# Patient Record
Sex: Female | Born: 1975
Health system: Southern US, Community
[De-identification: ages and names within clinical notes are randomized; demographics above are authoritative.]

## PROBLEM LIST (undated history)

## (undated) ENCOUNTER — Ambulatory Visit (HOSPITAL_COMMUNITY): Payer: Commercial Managed Care - PPO

## (undated) ENCOUNTER — Ambulatory Visit (HOSPITAL_COMMUNITY): Admission: EM | Payer: Commercial Managed Care - PPO

## (undated) ENCOUNTER — Ambulatory Visit

## (undated) ENCOUNTER — Ambulatory Visit: Payer: Commercial Managed Care - PPO

## (undated) ENCOUNTER — Ambulatory Visit: Admission: EM | Payer: No Typology Code available for payment source | Source: Home / Self Care

## (undated) ENCOUNTER — Ambulatory Visit: Payer: TRICARE (CHAMPUS)

## (undated) ENCOUNTER — Ambulatory Visit: Attending: Ambulatory Care | Primary: Ambulatory Care

## (undated) ENCOUNTER — Encounter

## (undated) ENCOUNTER — Encounter: Payer: TRICARE (CHAMPUS) | Attending: Family | Primary: Family

## (undated) ENCOUNTER — Non-Acute Institutional Stay: Payer: TRICARE (CHAMPUS)

## (undated) ENCOUNTER — Telehealth: Attending: Family | Primary: Family

## (undated) DIAGNOSIS — M459 Ankylosing spondylitis of unspecified sites in spine: Secondary | ICD-10-CM

## (undated) DIAGNOSIS — K219 Gastro-esophageal reflux disease without esophagitis: Secondary | ICD-10-CM

## (undated) DIAGNOSIS — U071 COVID-19: Secondary | ICD-10-CM

## (undated) DIAGNOSIS — M069 Rheumatoid arthritis, unspecified: Secondary | ICD-10-CM

## (undated) DIAGNOSIS — G43909 Migraine, unspecified, not intractable, without status migrainosus: Secondary | ICD-10-CM

## (undated) DIAGNOSIS — F419 Anxiety disorder, unspecified: Secondary | ICD-10-CM

## (undated) DIAGNOSIS — R32 Unspecified urinary incontinence: Secondary | ICD-10-CM

## (undated) DIAGNOSIS — E079 Disorder of thyroid, unspecified: Secondary | ICD-10-CM

## (undated) DIAGNOSIS — D649 Anemia, unspecified: Secondary | ICD-10-CM

## (undated) DIAGNOSIS — I10 Essential (primary) hypertension: Secondary | ICD-10-CM

## (undated) DIAGNOSIS — R079 Chest pain, unspecified: Secondary | ICD-10-CM

## (undated) DIAGNOSIS — G35 Multiple sclerosis: Secondary | ICD-10-CM

## (undated) DIAGNOSIS — G35D Multiple sclerosis, unspecified: Secondary | ICD-10-CM

## (undated) HISTORY — PX: APPENDECTOMY: SHX54

## (undated) HISTORY — DX: Anemia, unspecified: D64.9

## (undated) HISTORY — DX: Anxiety disorder, unspecified: F41.9

## (undated) HISTORY — DX: Unspecified urinary incontinence: R32

## (undated) HISTORY — DX: Ankylosing spondylitis of unspecified sites in spine: M45.9

## (undated) HISTORY — DX: Migraine, unspecified, not intractable, without status migrainosus: G43.909

## (undated) HISTORY — DX: Essential (primary) hypertension: I10

## (undated) HISTORY — PX: ABDOMINAL HYSTERECTOMY: SHX81

## (undated) HISTORY — DX: Gastro-esophageal reflux disease without esophagitis: K21.9

## (undated) HISTORY — PX: CHOLECYSTECTOMY: SHX55

## (undated) HISTORY — PX: TUBAL LIGATION: SHX77

---

## 2002-08-03 HISTORY — PX: HERNIA REPAIR: SHX51

## 2004-08-03 HISTORY — PX: KNEE SURGERY: SHX244

## 2017-09-03 ENCOUNTER — Ambulatory Visit (HOSPITAL_COMMUNITY)
Admission: EM | Admit: 2017-09-03 | Discharge: 2017-09-03 | Disposition: A | Discharge status: Home / Self Care | Attending: Internal Medicine | Admitting: Internal Medicine

## 2017-09-03 ENCOUNTER — Encounter (HOSPITAL_COMMUNITY): Payer: Self-pay | Admitting: Emergency Medicine

## 2017-09-03 ENCOUNTER — Other Ambulatory Visit: Payer: Self-pay

## 2017-09-03 ENCOUNTER — Emergency Department (HOSPITAL_COMMUNITY)

## 2017-09-03 ENCOUNTER — Encounter (HOSPITAL_COMMUNITY): Payer: Self-pay | Admitting: Family Medicine

## 2017-09-03 ENCOUNTER — Observation Stay (HOSPITAL_COMMUNITY)
Admission: EM | Admit: 2017-09-03 | Discharge: 2017-09-04 | Disposition: A | Discharge status: Home / Self Care | Attending: Family Medicine | Admitting: Family Medicine

## 2017-09-03 DIAGNOSIS — Z79899 Other long term (current) drug therapy: Secondary | ICD-10-CM | POA: Insufficient documentation

## 2017-09-03 DIAGNOSIS — R072 Precordial pain: Secondary | ICD-10-CM | POA: Diagnosis not present

## 2017-09-03 DIAGNOSIS — E039 Hypothyroidism, unspecified: Secondary | ICD-10-CM | POA: Diagnosis not present

## 2017-09-03 DIAGNOSIS — M059 Rheumatoid arthritis with rheumatoid factor, unspecified: Secondary | ICD-10-CM | POA: Diagnosis not present

## 2017-09-03 DIAGNOSIS — R079 Chest pain, unspecified: Secondary | ICD-10-CM | POA: Diagnosis not present

## 2017-09-03 DIAGNOSIS — E079 Disorder of thyroid, unspecified: Secondary | ICD-10-CM

## 2017-09-03 DIAGNOSIS — M069 Rheumatoid arthritis, unspecified: Secondary | ICD-10-CM | POA: Diagnosis not present

## 2017-09-03 HISTORY — DX: Chest pain, unspecified: R07.9

## 2017-09-03 HISTORY — DX: Rheumatoid arthritis, unspecified: M06.9

## 2017-09-03 HISTORY — DX: Disorder of thyroid, unspecified: E07.9

## 2017-09-03 LAB — TROPONIN I
Troponin I: <0.03 ng/mL (ref <0.03)
Troponin I: <0.03 ng/mL (ref <0.03)
Troponin I: <0.03 ng/mL (ref <0.03)

## 2017-09-03 LAB — LIPID PANEL
Cholesterol: 228 mg/dL — ABNORMAL HIGH (ref 0–200)
HDL: 47 mg/dL (ref >40)
LDL Cholesterol: 165 mg/dL — ABNORMAL HIGH (ref 0–99)
Total CHOL/HDL Ratio: 4.9 RATIO
Triglycerides: 82 mg/dL (ref <150)
VLDL: 16 mg/dL (ref 0–40)

## 2017-09-03 LAB — I-STAT TROPONIN, ED
Troponin i, poc: 0 ng/mL (ref 0.00–0.08)
Troponin i, poc: 0 ng/mL (ref 0.00–0.08)

## 2017-09-03 LAB — BASIC METABOLIC PANEL
Anion gap: 10 (ref 5–15)
BUN: 7 mg/dL (ref 6–20)
CO2: 23 mmol/L (ref 22–32)
Calcium: 9.1 mg/dL (ref 8.9–10.3)
Chloride: 103 mmol/L (ref 101–111)
Creatinine, Ser: 0.74 mg/dL (ref 0.44–1.00)
GFR calc Af Amer: >60 mL/min (ref >60)
GFR calc non Af Amer: >60 mL/min (ref >60)
Glucose, Bld: 83 mg/dL (ref 65–99)
Potassium: 3.8 mmol/L (ref 3.5–5.1)
Sodium: 136 mmol/L (ref 135–145)

## 2017-09-03 LAB — CBC
HCT: 36.5 % (ref 36.0–46.0)
Hemoglobin: 11.5 g/dL — ABNORMAL LOW (ref 12.0–15.0)
MCH: 25.3 pg — ABNORMAL LOW (ref 26.0–34.0)
MCHC: 31.5 g/dL (ref 30.0–36.0)
MCV: 80.4 fL (ref 78.0–100.0)
Platelets: 278 10*3/uL (ref 150–400)
RBC: 4.54 MIL/uL (ref 3.87–5.11)
RDW: 15 % (ref 11.5–15.5)
WBC: 8 10*3/uL (ref 4.0–10.5)

## 2017-09-03 LAB — I-STAT BETA HCG BLOOD, ED (MC, WL, AP ONLY): I-stat hCG, quantitative: <5 m[IU]/mL (ref <5)

## 2017-09-03 LAB — D-DIMER, QUANTITATIVE: D-Dimer, Quant: 0.57 ug/mL-FEU — ABNORMAL HIGH (ref 0.00–0.50)

## 2017-09-03 LAB — TSH: TSH: 0.943 u[IU]/mL (ref 0.350–4.500)

## 2017-09-03 MED ORDER — ACETAMINOPHEN 325 MG PO TABS: 650.0000 mg | ORAL_TABLET | ORAL | Status: DC | PRN

## 2017-09-03 MED ORDER — DARIFENACIN HYDROBROMIDE ER 7.5 MG PO TB24
7.5000 mg | ORAL_TABLET | Freq: Every day | ORAL | Status: DC
  Administered 2017-09-04: 7.5 mg via ORAL
  Filled 2017-09-03: qty 1

## 2017-09-03 MED ORDER — ESCITALOPRAM OXALATE 10 MG PO TABS
10.0000 mg | ORAL_TABLET | Freq: Every day | ORAL | Status: DC
Start: 2017-09-04 — End: 2017-09-04
  Administered 2017-09-04: 10 mg via ORAL
  Filled 2017-09-03: qty 1

## 2017-09-03 MED ORDER — ONDANSETRON HCL 4 MG/2ML IJ SOLN
4.0000 mg | Freq: Four times a day (QID) | INTRAMUSCULAR | Status: DC | PRN
  Administered 2017-09-04: 4 mg via INTRAVENOUS
  Filled 2017-09-03: qty 2

## 2017-09-03 MED ORDER — IBUPROFEN 200 MG PO TABS
400.0000 mg | ORAL_TABLET | ORAL | Status: DC | PRN
  Administered 2017-09-03 – 2017-09-04 (×2): 400 mg via ORAL
  Filled 2017-09-03 (×2): qty 2

## 2017-09-03 MED ORDER — CELECOXIB 200 MG PO CAPS
200.0000 mg | ORAL_CAPSULE | Freq: Two times a day (BID) | ORAL | Status: DC
  Administered 2017-09-03 – 2017-09-04 (×2): 200 mg via ORAL
  Filled 2017-09-03 (×3): qty 1

## 2017-09-03 MED ORDER — SODIUM CHLORIDE 0.9 % IV SOLN
INTRAVENOUS | Status: DC
  Administered 2017-09-03: 18:00:00 via INTRAVENOUS

## 2017-09-03 MED ORDER — LINACLOTIDE 145 MCG PO CAPS
145.0000 ug | ORAL_CAPSULE | Freq: Every day | ORAL | Status: DC
  Administered 2017-09-04: 145 ug via ORAL
  Filled 2017-09-03: qty 1

## 2017-09-03 MED ORDER — GI COCKTAIL ~~LOC~~
30.0000 mL | Freq: Four times a day (QID) | ORAL | Status: DC | PRN
  Administered 2017-09-04: 30 mL via ORAL
  Filled 2017-09-03: qty 30

## 2017-09-03 MED ORDER — FENTANYL CITRATE (PF) 100 MCG/2ML IJ SOLN
50.0000 ug | INTRAMUSCULAR | Status: DC | PRN
  Administered 2017-09-03 – 2017-09-04 (×3): 50 ug via INTRAVENOUS
  Filled 2017-09-03 (×3): qty 2

## 2017-09-03 MED ORDER — ENOXAPARIN SODIUM 40 MG/0.4ML ~~LOC~~ SOLN
40.0000 mg | SUBCUTANEOUS | Status: DC
  Administered 2017-09-03: 40 mg via SUBCUTANEOUS
  Filled 2017-09-03: qty 0.4

## 2017-09-03 MED ORDER — ETANERCEPT 50 MG/ML ~~LOC~~ SOSY: 50.0000 mg | PREFILLED_SYRINGE | SUBCUTANEOUS | Status: DC

## 2017-09-03 MED ORDER — LEVOTHYROXINE SODIUM 25 MCG PO TABS
25.0000 ug | ORAL_TABLET | Freq: Every day | ORAL | Status: DC
  Administered 2017-09-04: 25 ug via ORAL
  Filled 2017-09-03: qty 1

## 2017-09-03 MED ORDER — FOLIC ACID 1 MG PO TABS
1.0000 mg | ORAL_TABLET | Freq: Every day | ORAL | Status: DC
  Administered 2017-09-04: 1 mg via ORAL
  Filled 2017-09-03: qty 1

## 2017-09-03 MED ORDER — ZOLPIDEM TARTRATE 5 MG PO TABS: 5.0000 mg | ORAL_TABLET | Freq: Every evening | ORAL | Status: DC | PRN

## 2017-09-03 MED ORDER — LEFLUNOMIDE 20 MG PO TABS
20.0000 mg | ORAL_TABLET | Freq: Every day | ORAL | Status: DC
  Administered 2017-09-04: 20 mg via ORAL
  Filled 2017-09-03: qty 1

## 2017-09-03 MED ORDER — MAGNESIUM GLUCONATE 500 MG PO TABS
250.0000 mg | ORAL_TABLET | Freq: Every day | ORAL | Status: DC
  Filled 2017-09-03: qty 1

## 2017-09-03 MED ORDER — ASPIRIN EC 325 MG PO TBEC
325.0000 mg | DELAYED_RELEASE_TABLET | Freq: Every day | ORAL | Status: DC
  Administered 2017-09-04: 325 mg via ORAL
  Filled 2017-09-03: qty 1

## 2017-09-03 MED ORDER — VITAMIN D 1000 UNITS PO TABS
1000.0000 [IU] | ORAL_TABLET | Freq: Every day | ORAL | Status: DC
  Administered 2017-09-04: 1000 [IU] via ORAL
  Filled 2017-09-03: qty 1

## 2017-09-03 MED ORDER — MORPHINE SULFATE (PF) 4 MG/ML IV SOLN
2.0000 mg | INTRAVENOUS | Status: DC | PRN
  Administered 2017-09-03: 2 mg via INTRAVENOUS
  Filled 2017-09-03: qty 1

## 2017-09-03 MED ORDER — ASPIRIN 81 MG PO CHEW
324.0000 mg | CHEWABLE_TABLET | Freq: Once | ORAL | Status: AC
  Administered 2017-09-03: 324 mg via ORAL
  Filled 2017-09-03: qty 4

## 2017-09-03 MED ORDER — IOPAMIDOL (ISOVUE-370) INJECTION 76%
INTRAVENOUS | Status: AC
  Administered 2017-09-03: 100 mL
  Filled 2017-09-03: qty 100

## 2017-09-03 MED ORDER — NITROGLYCERIN 0.4 MG SL SUBL
0.4000 mg | SUBLINGUAL_TABLET | SUBLINGUAL | Status: DC | PRN
  Administered 2017-09-03 – 2017-09-04 (×2): 0.4 mg via SUBLINGUAL
  Filled 2017-09-03 (×2): qty 1

## 2017-09-03 MED ORDER — MAGNESIUM 250 MG PO TABS: 250.0000 mg | ORAL_TABLET | Freq: Every day | ORAL | Status: DC

## 2017-09-03 NOTE — Consult Note (Signed)
Cardiology Consult    Patient ID: Maria Wiley MRN: [id number], DOB/AGE: 01-Feb-1976   Admit date: 09/03/2017 Date of Consult: 09/03/2017  Primary Physician: Patient, No Pcp Per Primary Cardiologist: New Requesting Provider: Luberta Robertson Reason for Consultation: Chest pain  Maria Wiley is a 43 y.o. female who is being seen today for the evaluation of Chest pain at the request of Dr. Luberta Robertson.   Patient Profile    42 yo female with PMH of RA and hypothyroidism who presented with chest pain.  Past Medical History   Past Medical History:  Diagnosis Date  . Chest pain   . RA (rheumatoid arthritis) (HCC)   . Thyroid disease     Past Surgical History:  Procedure Laterality Date  . ABDOMINAL HYSTERECTOMY    . APPENDECTOMY    . CHOLECYSTECTOMY       Allergies  No Known Allergies  History of Present Illness    Maria Wiley is a 42 yo female with PMH of RA and hypothyroidism. Reports never having been seen by cardiology in the past. She is retired from [employer] and has 4 children. Fairly active at home and does not normally have anginal symptoms. Over the past several days she had felt short of breath, like when walking up the stairs. Last night had an episode of chest pain that was sharp in nature with numbness in her left arm. Took some ASA and cough medication and fell asleep. Woke up this morning with sensation of "something stuck in her chest". Chest pain is worse with deep breath and movement.   In the ED her labs showed stable electrolytes, neg trop x1. CXR negative. EKG SR with non specific T wave abnormality. Ddimer was 0.57, but CTA negative for PE. She attempted to ambulate in the ED and had some recurrent chest pain. Admitted to internal medicine and cardiology asked to consult.   Inpatient Medications    . [START ON 09/04/2017] aspirin EC  325 mg Oral Daily  . celecoxib  200 mg Oral BID  . [START ON 09/04/2017] cholecalciferol  1,000 Units Oral Daily  . [START ON  09/04/2017] darifenacin  7.5 mg Oral Daily  . enoxaparin (LOVENOX) injection  40 mg Subcutaneous Q24H  . [START ON 09/04/2017] escitalopram  10 mg Oral Daily  . [START ON 09/04/2017] folic acid  1 mg Oral Daily  . [START ON 09/04/2017] leflunomide  20 mg Oral Daily  . [START ON 09/04/2017] levothyroxine  25 mcg Oral QAC breakfast  . [START ON 09/04/2017] linaclotide  145 mcg Oral Daily  . [START ON 09/04/2017] magnesium gluconate  250 mg Oral Daily    Family History    Family History  Problem Relation Age of Onset  . CAD Maternal Grandfather   . CAD Paternal Grandfather     Social History    Social History   Socioeconomic History  . Marital status: Married    Spouse name: Not on file  . Number of children: Not on file  . Years of education: Not on file  . Highest education level: Not on file  Social Needs  . Financial resource strain: Not on file  . Food insecurity - worry: Not on file  . Food insecurity - inability: Not on file  . Transportation needs - medical: Not on file  . Transportation needs - non-medical: Not on file  Occupational History  . Not on file  Tobacco Use  . Smoking status: Never Smoker  . Smokeless tobacco: Never Used  Substance and Sexual Activity  . Alcohol use: Not on file  . Drug use: Not on file  . Sexual activity: Not on file  Other Topics Concern  . Not on file  Social History Narrative  . Not on file     Review of Systems    See HPI  All other systems reviewed and are otherwise negative except as noted above.  Physical Exam    Blood pressure 113/89, pulse 74, temperature 98 F (36.7 C), temperature source Oral, resp. rate 16, SpO2 96 %.  General: Pleasant, NAD Psych: Normal affect. Neuro: Alert and oriented X 3. Moves all extremities spontaneously. HEENT: Normal  Neck: Supple without bruits or JVD. Lungs:  Resp regular and unlabored, CTA. Heart: RRR no s3, s4, or murmurs. Abdomen: Soft, non-tender, non-distended, BS + x 4.  Extremities:  No clubbing, cyanosis or edema. DP/PT/Radials 2+ and equal bilaterally.  Labs    Troponin [facility] of Care Test) Recent Labs    09/03/17 1332  TROPIPOC 0.00   No results for input(s): CKTOTAL, CKMB, TROPONINI in the last 72 hours. Lab Results  Component Value Date   WBC 8.0 09/03/2017   HGB 11.5 (L) 09/03/2017   HCT 36.5 09/03/2017   MCV 80.4 09/03/2017   PLT 278 09/03/2017    Recent Labs  Lab 09/03/17 1106  NA 136  K 3.8  CL 103  CO2 23  BUN 7  CREATININE 0.74  CALCIUM 9.1  GLUCOSE 83   No results found for: CHOL, HDL, LDLCALC, TRIG Lab Results  Component Value Date   DDIMER 0.57 (H) 09/03/2017     Radiology Studies    Dg Chest 2 View  Result Date: 09/03/2017 CLINICAL DATA:  Chest pain since yesterday. She also states that she feels like something is stuck in her chest No other chest complaintsNo known chest HX EXAM: CHEST  2 VIEW COMPARISON:  None. FINDINGS: The heart size and mediastinal contours are within normal limits. Both lungs are clear. No pleural effusion or pneumothorax. The visualized skeletal structures are unremarkable. IMPRESSION: No active cardiopulmonary disease. Electronically Signed   By: Amie Portland M.D.   On: 09/03/2017 11:53   Ct Angio Chest Pe W And/or Wo Contrast  Result Date: 09/03/2017 CLINICAL DATA:  Left chest pain and shortness of breath since last night. EXAM: CT ANGIOGRAPHY CHEST WITH CONTRAST TECHNIQUE: Multidetector CT imaging of the chest was performed using the standard protocol during bolus administration of intravenous contrast. Multiplanar CT image reconstructions and MIPs were obtained to evaluate the vascular anatomy. CONTRAST:  100 ml ISOVUE-370 IOPAMIDOL (ISOVUE-370) INJECTION 76% COMPARISON:  Single-view of the chest earlier today. FINDINGS: Cardiovascular: No pulmonary embolus is identified. There is cardiomegaly. No pericardial effusion. No calcific atherosclerosis. No aneurysm. Mediastinum/Nodes: No enlarged mediastinal, hilar,  or axillary lymph nodes. Thyroid gland, trachea, and esophagus demonstrate no significant findings. Lungs/Pleura: The lungs demonstrate only mild dependent atelectasis. No pleural effusion. Upper Abdomen: Status post cholecystectomy.  No focal abnormality. Musculoskeletal: Negative. Review of the MIP images confirms the above findings. IMPRESSION: Negative for pulmonary embolus.  No acute disease. Cardiomegaly. Electronically Signed   By: Drusilla Kanner M.D.   On: 09/03/2017 15:21    ECG & Cardiac Imaging    EKG: SR with nonspecific T wave changes  Assessment & Plan    42 yo female with PMH of RA and hypothyroidism who presented with chest pain.  1. Chest pain: Pain is atypical in nature, sharp with inspiration and worse with movement.  Ddimer was 0.5 but CTA was negative. EKG showed SR but no previous to compare.  -- cycle troponins -- will plan for exercise stress in the am if troponins remain negative  --  Check echo -- add ibuprofen   Signed, Maria Page, NP-C Pager [phone] 09/03/2017, 5:07 PM  As above, patient seen and examined.  Briefly she is a 42 year old female with past medical history of rheumatoid arthritis and hypothyroidism as well as ankylosing spondylitis for evaluation of chest pain.  Patient typically does not have dyspnea, exertional chest pain or syncope.  Last evening she developed pain under her left breast radiating to her shoulder and arm.  The pain was described as sharp and stabbing.  No associated nausea, dyspnea or diaphoresis.  She took aspirin and cough syrup and fell asleep.  When she awoke this morning the pain was still there.  It increases with inspiration and lying flat.  She denies fevers, chills, productive cough or recent viral illness.  CTA shows no pulmonary embolus.  Troponin is normal.  Hemoglobin 11.5.  Electrocardiogram shows sinus rhythm with nonspecific anterior T wave changes.  No prior electrocardiogram for comparison. 1 chest  pain-etiology unclear.  It increases with inspiration but CTA is negative for pulmonary embolus.  It also worsens with lying flat.  Question pericarditis although ECG not classic.  Will check echocardiogram.  Continue to cycle enzymes.  If enzymes negative will arrange nuclear study for tomorrow morning for risk stratification.  Would also add ibuprofen.  2 rheumatoid arthritis-management per primary care.  Maria Millers, MD

## 2017-09-03 NOTE — ED Triage Notes (Signed)
Mid sternal cp since last night with radiation to left arm had some left arm weakness, that is b etter today but had sob and it still feels hard to take a deep breath, no nausea or vomiting , denies injury or heavy lifting, feels tight across left shoulder

## 2017-09-03 NOTE — ED Provider Notes (Signed)
[facility]    CSN: [id number] Arrival date & time: 09/03/17  6195     History   Chief Complaint Chief Complaint  Patient presents with  . Chest Pain    HPI Maria Wiley is a 42 y.o. female.   Maria Wiley presents with complaints of chest pain which started last night while she was laying in bed. Became quite severe for her to central and left chest, radiating to left arm making it feel weak. She felt short of breath and pain increased with deep breathing. Without diaphoresis or nausea. States she took aspirin which helped pain. Woke this morning and still with central chest pain "like something is lodged" to central chest. Denies cough, URI symptoms, dizziness, abdominal pain, nausea, vomiting, history of gerd, cardiac history, leg pain, swelling, recent travel or smoking. She feels the pain is worse with taking a deep breath. Rates current pain 6/10. Has not taken anything this morning. History of RA and ankylosing spondylitis.    ROS per HPI.       Past Medical History:  Diagnosis Date  . RA (rheumatoid arthritis) (HCC)   . Thyroid disease     There are no active problems to display for this patient.   Past Surgical History:  Procedure Laterality Date  . ABDOMINAL HYSTERECTOMY    . APPENDECTOMY    . CHOLECYSTECTOMY      OB History    No data available       Home Medications    Prior to Admission medications   Medication Sig Start Date End Date Taking? Authorizing Provider  etanercept (ENBREL) 50 MG/ML injection Inject 50 mg into the skin once a week.   Yes [provider]  Methotrexate, PF, 17.5 MG/0.35ML SOAJ Inject 0.8 mLs into the skin.   Yes [provider]    Family History History reviewed. No pertinent family history.  Social History Social History   Tobacco Use  . Smoking status: Never Smoker  Substance Use Topics  . Alcohol use: Not on file  . Drug use: Not on file     Allergies   Patient has no known  allergies.   Review of Systems Review of Systems   Physical Exam Triage Vital Signs ED Triage Vitals  Enc Vitals Group     BP 09/03/17 1037 116/78     Pulse Rate 09/03/17 1037 82     Resp 09/03/17 1037 16     Temp 09/03/17 1037 (!) 97.5 F (36.4 C)     Temp src --      SpO2 09/03/17 1037 97 %     Weight --      Height --      Head Circumference --      Peak Flow --      Pain Score 09/03/17 1010 6     Pain Loc --      Pain Edu? --      Excl. in GC? --    No data found.  Updated Vital Signs BP 116/78   Pulse 82   Temp (!) 97.5 F (36.4 C)   Resp 16   SpO2 97%   Visual Acuity Right Eye Distance:   Left Eye Distance:   Bilateral Distance:    Right Eye Near:   Left Eye Near:    Bilateral Near:     Physical Exam  Constitutional: She is oriented to person, place, and time. She appears well-developed and well-nourished. No distress.  Cardiovascular: Normal rate, regular  rhythm and normal heart sounds.  Pulmonary/Chest: Effort normal and breath sounds normal.  Neurological: She is alert and oriented to person, place, and time.  Skin: Skin is warm and dry.   EKG NSR without obvious acute changes at this time.   UC Treatments / Results  Labs (all labs ordered are listed, but only abnormal results are displayed) Labs Reviewed - No data to display  EKG  EKG Interpretation None       Radiology No results found.  Procedures Procedures (including critical care time)  Medications Ordered in UC Medications - No data to display   Initial Impression / Assessment and Plan / UC Course  I have reviewed the triage vital signs and the nursing notes.  Pertinent labs & imaging results that were available during my care of the patient were reviewed by me and considered in my medical decision making (see chart for details).     Patient with persistent chest pain. Last night with arm pain, shortness of breath and symptoms which improved with aspirin. Central  chest pain persists. Few risks factors for ACS or PE at this time, however still with 6/10 chest pain and last nights symptoms concerning for cardiac concern. Reassuring initial EKG. Recommended patient go to ER at this time for more complete evaluation and rule outs. Patient verbalized understanding and agreeable to plan.  Stable for self transfer to ED.   Final Clinical Impressions(s) / UC Diagnoses   Final diagnoses:  Chest pain, unspecified type    ED Discharge Orders    None       Controlled Substance Prescriptions [place] Controlled Substance Registry consulted? Not Applicable   Georgetta Haber, NP 09/03/17 1046

## 2017-09-03 NOTE — ED Notes (Signed)
Blood specimens collected through PIV. Pre and post flush.

## 2017-09-03 NOTE — H&P (Signed)
History and Physical    Maria Wiley JSE:[id number] DOB: 1976/04/06 DOA: 09/03/2017  PCP: Patient, No Pcp Per PCP in [place] Patient coming from: home  Chief Complaint: chest pain  HPI: Maria Wiley is a 42 y.o. female with medical history significant disease since emergency department with chief complaint of chest pain. [facility] are asked to admit  Formation is obtained from the patient and the chart. She states last night she developed mid sternal chest pain. She states the pain was constant sharp radiated to her left arm. She also complained of intermittent left arm weakness. Other associated symptoms include shortness of breath and a "tight" feeling across her left shoulder. She denies headache dizziness syncope or near-syncope. She denies diaphoresis nausea or vomiting. She does indicate it is difficult to take a deep breath. She says this morning her left arm feels better but she still has a sharp stabbing pain that is worse with inspiration. She denies lower extremity edema orthopnea. She denies fever chills cough dysuria hematuria frequency or urgency. She denies abdominal pain diarrhea constipation melena bright red blood per rectum.   ED Course: In the emergency department she's afebrile hemodynamically stable and not hypoxic. She has a mildly elevated d-dimer CT angiogram negative for PE or bone and are negative. Initially she was given the discharge she ambulated in the hallway in the chest pain came back. ED provider felt with the exertional component stress test may be in order  Review of Systems: As per HPI otherwise all other systems reviewed and are negative.   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 Chest pain   . RA (rheumatoid arthritis) (HCC)   . Thyroid disease     Past Surgical History:  Procedure Laterality Date  . ABDOMINAL HYSTERECTOMY     . APPENDECTOMY    . CHOLECYSTECTOMY      Social History   Socioeconomic History  . Marital status: Married    Spouse name: Not on file  . Number of children: Not on file  . Years of education: Not on file  . Highest education level: Not on file  Social Needs  . Financial resource strain: Not on file  . Food insecurity - worry: Not on file  . Food insecurity - inability: Not on file  . Transportation needs - medical: Not on file  . Transportation needs - non-medical: Not on file  Occupational History  . Not on file  Tobacco Use  . Smoking status: Never Smoker  . Smokeless tobacco: Never Used  Substance and Sexual Activity  . Alcohol use: Not on file  . Drug use: Not on file  . Sexual activity: Not on file  Other Topics Concern  . Not on file  Social History Narrative  . Not on file    No Known Allergies  Family History  Problem Relation Age of Onset  . CAD Maternal Grandfather   . CAD Paternal Grandfather   . Hypertension Mother     Prior to Admission medications   Medication Sig Start Date End Date Taking? Authorizing Provider  celecoxib (CELEBREX) 200 MG capsule Take 200 mg by mouth 2 (two) times daily.   Yes [provider]  cholecalciferol (VITAMIN D) 1000 units tablet Take 1,000 Units by mouth daily.   Yes [provider]  escitalopram (LEXAPRO) 20 MG tablet Take 10 mg by mouth daily.   Yes  [provider]  etanercept (ENBREL) 50 MG/ML injection Inject 50 mg into the skin once a week.   Yes [provider]  folic acid (FOLVITE) 1 MG tablet Take 1 mg by mouth daily. 08/23/17  Yes [provider]  ibuprofen (ADVIL,MOTRIN) 200 MG tablet Take 400 mg by mouth as needed for moderate pain.   Yes [provider]  leflunomide (ARAVA) 20 MG tablet Take 20 mg by mouth daily.   Yes [provider]  levothyroxine (SYNTHROID, LEVOTHROID) 25 MCG tablet Take 25 mcg by mouth daily before breakfast.   Yes [provider]  LINZESS 145 MCG CAPS capsule Take 145 mcg by mouth daily. 07/25/17  Yes [provider]  Magnesium 250 MG TABS Take 250 mg by mouth daily.   Yes [provider]  Methotrexate, PF, 17.5 MG/0.35ML SOAJ Inject 0.8 mLs into the skin.   Yes [provider]  VESICARE 10 MG tablet Take 10 mg by mouth daily. 07/05/17  Yes [provider]    Physical Exam: Vitals:   09/03/17 1545 09/03/17 1615 09/03/17 1630 09/03/17 1645  BP: 126/71 109/79 (!) 106/59 113/89  Pulse: 80 87 70 74  Resp:   16 16  Temp:      TempSrc:      SpO2: 98% 95% 98% 96%     General:  Appears calm and comfortable sitting up in bed in no acute distress Eyes:  PERRL, EOMI, normal lids, iris ENT:  grossly normal hearing, lips & tongue, mmm  Neck:  no LAD, masses or thyromegaly Cardiovascular:  RRR, no m/r/g. No LE edema. Pulses present and palpable Respiratory:  CTA bilaterally, no w/r/r. Normal respiratory effort. Abdomen:  soft, ntnd, positive bowel sounds no guarding or rebounding Skin:  no rash or induration seen on limited exam Musculoskeletal:  grossly normal tone BUE/BLE, good ROM, no bony abnormality Psychiatric:  grossly normal mood and affect, speech fluent and appropriate, AOx3 Neurologic:  CN 2-12 grossly intact, moves all extremities in coordinated fashion, sensation intact speech clear facial symmetry bilateral grip 5 out of 5 lower extremity strength 5 out of 5  Labs on Admission: I have personally reviewed following labs and imaging studies  CBC: Recent Labs  Lab 09/03/17 1106  WBC 8.0  HGB 11.5*  HCT 36.5  MCV 80.4  PLT 278   Basic Metabolic Panel: Recent Labs  Lab 09/03/17 1106  NA 136  K 3.8  CL 103  CO2 23  GLUCOSE 83  BUN 7  CREATININE 0.74  CALCIUM 9.1   GFR: CrCl cannot be calculated (Unknown ideal weight.). Liver Function Tests: No results for input(s): AST, ALT, ALKPHOS, BILITOT, PROT, ALBUMIN in the last 168 hours. No results  for input(s): LIPASE, AMYLASE in the last 168 hours. No results for input(s): AMMONIA in the last 168 hours. Coagulation Profile: No results for input(s): INR, PROTIME in the last 168 hours. Cardiac Enzymes: No results for input(s): CKTOTAL, CKMB, CKMBINDEX, TROPONINI in the last 168 hours. BNP (last 3 results) No results for input(s): PROBNP in the last 8760 hours. HbA1C: No results for input(s): HGBA1C in the last 72 hours. CBG: No results for input(s): GLUCAP in the last 168 hours. Lipid Profile: No results for input(s): CHOL, HDL, LDLCALC, TRIG, CHOLHDL, LDLDIRECT in the last 72 hours. Thyroid Function Tests: No results for input(s): TSH, T4TOTAL, FREET4, T3FREE, THYROIDAB in the last 72 hours. Anemia Panel: No results for input(s): VITAMINB12, FOLATE, FERRITIN, TIBC, IRON, RETICCTPCT in the last  72 hours. Urine analysis: No results found for: COLORURINE, APPEARANCEUR, LABSPEC, PHURINE, GLUCOSEU, HGBUR, BILIRUBINUR, KETONESUR, PROTEINUR, UROBILINOGEN, NITRITE, LEUKOCYTESUR  Creatinine Clearance: CrCl cannot be calculated (Unknown ideal weight.).  Sepsis Labs: @LABRCNTIP (procalcitonin:4,lacticidven:4) )No results found for this or any previous visit (from the past 240 hour(s)).   Radiological Exams on Admission: Dg Chest 2 View  Result Date: 09/03/2017 CLINICAL DATA:  Chest pain since yesterday. She also states that she feels like something is stuck in her chest No other chest complaintsNo known chest HX EXAM: CHEST  2 VIEW COMPARISON:  None. FINDINGS: The heart size and mediastinal contours are within normal limits. Both lungs are clear. No pleural effusion or pneumothorax. The visualized skeletal structures are unremarkable. IMPRESSION: No active cardiopulmonary disease. Electronically Signed   By: 11/01/2017 M.D.   On: 09/03/2017 11:53   Ct Angio Chest Pe W And/or Wo Contrast  Result Date: 09/03/2017 CLINICAL DATA:  Left chest pain and shortness of breath since last night.  EXAM: CT ANGIOGRAPHY CHEST WITH CONTRAST TECHNIQUE: Multidetector CT imaging of the chest was performed using the standard protocol during bolus administration of intravenous contrast. Multiplanar CT image reconstructions and MIPs were obtained to evaluate the vascular anatomy. CONTRAST:  100 ml ISOVUE-370 IOPAMIDOL (ISOVUE-370) INJECTION 76% COMPARISON:  Single-view of the chest earlier today. FINDINGS: Cardiovascular: No pulmonary embolus is identified. There is cardiomegaly. No pericardial effusion. No calcific atherosclerosis. No aneurysm. Mediastinum/Nodes: No enlarged mediastinal, hilar, or axillary lymph nodes. Thyroid gland, trachea, and esophagus demonstrate no significant findings. Lungs/Pleura: The lungs demonstrate only mild dependent atelectasis. No pleural effusion. Upper Abdomen: Status post cholecystectomy.  No focal abnormality. Musculoskeletal: Negative. Review of the MIP images confirms the above findings. IMPRESSION: Negative for pulmonary embolus.  No acute disease. Cardiomegaly. Electronically Signed   By: 11/01/2017 M.D.   On: 09/03/2017 15:21    EKG: Independently reviewed. Normal sinus rhythm Nonspecific T wave abnormality Abnormal ECG  Assessment/Plan Principal Problem:   Chest pain Active Problems:   RA (rheumatoid arthritis) (HCC)   Thyroid disease   1. Chest pain. Atypical. Pain with some pleuritic component.  Chest pain resolved until ambulation in the emergency department. Troponins negative. EKG as noted above. Chest x-ray without acute cardiopulmonary process. CT angiogram of the chest negative for pulmonary embolus. Evaluated by cardiology in the emergency department who opined atypical chest pain recommended medical admission and a stress test in the morning -Admit to telemetry -Serial EKG -Cycle troponin -npo past midnight -obtain echo -Stress test in the a.m. -Cardiology consult appreciated  2. Rheumatoid arthritis. Appears to be stable at baseline.  Home medications include Celebrex, Enbrel, leflunomide, methotrexate. -continue home meds -OP follow up  #3. Thyroid disease. Home medications include Synthroid. -TSH -continue home meds   DVT prophylaxis: lovenox Code Status: full  Family Communication: husband at bedside  Disposition Plan: home  Consults called:  cardiology Admission status: obs    11/01/2017 M MD [facility]  If 7PM-7AM, please contact night-coverage [email or web address] Password [email or web address]  09/03/2017, 5:08 PM

## 2017-09-03 NOTE — Discharge Instructions (Signed)
Please go to ER for complete evaluation of your chest pain.

## 2017-09-03 NOTE — ED Triage Notes (Signed)
Pt here for chest pain since last night with radiation into left arm. sts that the arm feels better but still having sharp stabbing pain and worse with deep breath. Denies recent illness. Denies recent travel.

## 2017-09-03 NOTE — ED Notes (Signed)
Attempted report 

## 2017-09-03 NOTE — Progress Notes (Signed)
Patient C/O Left Chest pain rating an "8" radiating to left shoulder, state pain increases with deep breath. VS B/P 157/99, HR 88 R 18, MD Chatterjee notified, ordered SL nitro, and EKG. EKG obtained and SL nitro administered after EKG obtained Pain decreased to a "6" shoulder pain decreased. B/P after 1st nitro B/P145/94, HR 103. After 2nd SL nitro given  C/P decreased to "3" Shoulder pain resolved, B/P 138/95, HR 94, Resp 18, pt c/o severe HA.

## 2017-09-03 NOTE — ED Provider Notes (Signed)
[facility] EMERGENCY DEPARTMENT Provider Note   CSN: [id number] Arrival date & time: 09/03/17  1046     History   Chief Complaint Chief Complaint  Patient presents with  . Chest Pain  . Shortness of Breath    HPI Janeane Cozart is a 42 y.o. female.  Patient with history of rheumatoid arthritis, thyroid disease presents with chest pain radiation to the left armthat started yesterday evening. Improve last night and then this morning it returned. Patient had mild pleuritic component. No nausea or vomiting. No injury. Patient denies classic blood clot risk factors. No cardiac history.      Past Medical History:  Diagnosis Date  . RA (rheumatoid arthritis) (HCC)   . Thyroid disease     There are no active problems to display for this patient.   Past Surgical History:  Procedure Laterality Date  . ABDOMINAL HYSTERECTOMY    . APPENDECTOMY    . CHOLECYSTECTOMY      OB History    No data available       Home Medications    Prior to Admission medications   Medication Sig Start Date End Date Taking? Authorizing Provider  celecoxib (CELEBREX) 200 MG capsule Take 200 mg by mouth 2 (two) times daily.   Yes [provider]  cholecalciferol (VITAMIN D) 1000 units tablet Take 1,000 Units by mouth daily.   Yes [provider]  escitalopram (LEXAPRO) 20 MG tablet Take 10 mg by mouth daily.   Yes [provider]  etanercept (ENBREL) 50 MG/ML injection Inject 50 mg into the skin once a week.   Yes [provider]  folic acid (FOLVITE) 1 MG tablet Take 1 mg by mouth daily. 08/23/17  Yes [provider]  ibuprofen (ADVIL,MOTRIN) 200 MG tablet Take 400 mg by mouth as needed for moderate pain.   Yes [provider]  leflunomide (ARAVA) 20 MG tablet Take 20 mg by mouth daily.   Yes [provider]  levothyroxine (SYNTHROID, LEVOTHROID) 25 MCG tablet Take 25 mcg by mouth daily before breakfast.   Yes  [provider]  LINZESS 145 MCG CAPS capsule Take 145 mcg by mouth daily. 07/25/17  Yes [provider]  Magnesium 250 MG TABS Take 250 mg by mouth daily.   Yes [provider]  Methotrexate, PF, 17.5 MG/0.35ML SOAJ Inject 0.8 mLs into the skin.   Yes [provider]  VESICARE 10 MG tablet Take 10 mg by mouth daily. 07/05/17  Yes [provider]    Family History No family history on file.  Social History Social History   Tobacco Use  . Smoking status: Never Smoker  . Smokeless tobacco: Never Used  Substance Use Topics  . Alcohol use: Not on file  . Drug use: Not on file     Allergies   Patient has no known allergies.   Review of Systems Review of Systems  Constitutional: Negative for chills and fever.  HENT: Negative for congestion.   Eyes: Negative for visual disturbance.  Respiratory: Positive for shortness of breath.   Cardiovascular: Positive for chest pain. Negative for leg swelling.  Gastrointestinal: Negative for abdominal pain and vomiting.  Genitourinary: Negative for dysuria and flank pain.  Musculoskeletal: Negative for back pain, neck pain and neck stiffness.  Skin: Negative for rash.  Neurological: Negative for light-headedness and headaches.     Physical Exam Updated Vital Signs BP 109/79   Pulse 87   Temp 98 F (36.7 C) (  Oral)   Resp 16   SpO2 95%   Physical Exam  Constitutional: She is oriented to person, place, and time. She appears well-developed and well-nourished.  HENT:  Head: Normocephalic and atraumatic.  Eyes: Conjunctivae are normal. Right eye exhibits no discharge. Left eye exhibits no discharge.  Neck: Normal range of motion. Neck supple. No tracheal deviation present.  Cardiovascular: Normal rate, regular rhythm and normal pulses.  Pulmonary/Chest: Effort normal and breath sounds normal.  Abdominal: Soft. She exhibits no distension. There is no tenderness. There is no guarding.    Musculoskeletal: She exhibits no edema.  Neurological: She is alert and oriented to person, place, and time.  Skin: Skin is warm. No rash noted.  Psychiatric: She has a normal mood and affect.  Nursing note and vitals reviewed.    ED Treatments / Results  Labs (all labs ordered are listed, but only abnormal results are displayed) Labs Reviewed  CBC - Abnormal; Notable for the following components:      Result Value   Hemoglobin 11.5 (*)    MCH 25.3 (*)    All other components within normal limits  D-DIMER, QUANTITATIVE (NOT AT [facility]) - Abnormal; Notable for the following components:   D-Dimer, Quant 0.57 (*)    All other components within normal limits  BASIC METABOLIC PANEL  I-STAT TROPONIN, ED  I-STAT BETA HCG BLOOD, ED ([facility], [facility], [facility] ONLY)  I-STAT TROPONIN, ED    EKG  EKG Interpretation  Date/Time:  Friday September 03 2017 11:00:21 EST Ventricular Rate:  80 PR Interval:  136 QRS Duration: 84 QT Interval:  400 QTC Calculation: 461 R Axis:   51 Text Interpretation:  Normal sinus rhythm Nonspecific T wave abnormality Abnormal ECG Reconfirmed by Blane Ohara [phone]) on 09/03/2017 12:49:43 PM       Radiology Dg Chest 2 View  Result Date: 09/03/2017 CLINICAL DATA:  Chest pain since yesterday. She also states that she feels like something is stuck in her chest No other chest complaintsNo known chest HX EXAM: CHEST  2 VIEW COMPARISON:  None. FINDINGS: The heart size and mediastinal contours are within normal limits. Both lungs are clear. No pleural effusion or pneumothorax. The visualized skeletal structures are unremarkable. IMPRESSION: No active cardiopulmonary disease. Electronically Signed   By: Amie Portland M.D.   On: 09/03/2017 11:53   Ct Angio Chest Pe W And/or Wo Contrast  Result Date: 09/03/2017 CLINICAL DATA:  Left chest pain and shortness of breath since last night. EXAM: CT ANGIOGRAPHY CHEST WITH CONTRAST TECHNIQUE: Multidetector CT imaging of the chest was  performed using the standard protocol during bolus administration of intravenous contrast. Multiplanar CT image reconstructions and MIPs were obtained to evaluate the vascular anatomy. CONTRAST:  100 ml ISOVUE-370 IOPAMIDOL (ISOVUE-370) INJECTION 76% COMPARISON:  Single-view of the chest earlier today. FINDINGS: Cardiovascular: No pulmonary embolus is identified. There is cardiomegaly. No pericardial effusion. No calcific atherosclerosis. No aneurysm. Mediastinum/Nodes: No enlarged mediastinal, hilar, or axillary lymph nodes. Thyroid gland, trachea, and esophagus demonstrate no significant findings. Lungs/Pleura: The lungs demonstrate only mild dependent atelectasis. No pleural effusion. Upper Abdomen: Status post cholecystectomy.  No focal abnormality. Musculoskeletal: Negative. Review of the MIP images confirms the above findings. IMPRESSION: Negative for pulmonary embolus.  No acute disease. Cardiomegaly. Electronically Signed   By: Drusilla Kanner M.D.   On: 09/03/2017 15:21    Procedures Procedures (including critical care time)  Medications Ordered in ED Medications  fentaNYL (SUBLIMAZE) injection 50 mcg (50 mcg Intravenous Given 09/03/17  1317)  aspirin chewable tablet 324 mg (324 mg Oral Given 09/03/17 1317)  iopamidol (ISOVUE-370) 76 % injection (100 mLs  Contrast Given 09/03/17 1507)     Initial Impression / Assessment and Plan / ED Course  I have reviewed the triage vital signs and the nursing notes.  Pertinent labs & imaging results that were available during my care of the patient were reviewed by me and considered in my medical decision making (see chart for details).    Patient presents with chest pain shortness of breath. Patient is low risk for blood clots and low risk for cardiac based on history and physical and risk factors. Troponins negative, EKG no signs of ischemia. ASA given.  Patient will need stress test in the next week. D-dimer mildly positive CT angiogram ordered to look  for pulmonary emboli. Pain improved and reassessment. Patient well-appearing and reassessment.  CT scan negative for blood clot felt the troponin negative. Patient overall low risk. Discussing plan for close outpatient follow-up for stress test however patient noted when she was walking in the ER as when she developed recurrent chest pain. With the exertional component hospitalist consulted for further evaluation for possible stress test.  The patients results and plan were reviewed and discussed.   Any x-rays performed were independently reviewed by myself.   Differential diagnosis were considered with the presenting HPI.  Medications  fentaNYL (SUBLIMAZE) injection 50 mcg (50 mcg Intravenous Given 09/03/17 1317)  aspirin chewable tablet 324 mg (324 mg Oral Given 09/03/17 1317)  iopamidol (ISOVUE-370) 76 % injection (100 mLs  Contrast Given 09/03/17 1507)    Vitals:   09/03/17 1515 09/03/17 1530 09/03/17 1545 09/03/17 1615  BP: 108/88 129/86 126/71 109/79  Pulse: 82 73 80 87  Resp:      Temp:      TempSrc:      SpO2: 99% 99% 98% 95%    Final diagnoses:  Acute chest pain    Admission/ observation were discussed with the admitting physician, patient and/or family and they are comfortable with the plan.   Final Clinical Impressions(s) / ED Diagnoses   Final diagnoses:  Acute chest pain    ED Discharge Orders    None       Blane Ohara, MD 09/03/17 2304

## 2017-09-04 ENCOUNTER — Observation Stay (HOSPITAL_BASED_OUTPATIENT_CLINIC_OR_DEPARTMENT_OTHER)

## 2017-09-04 ENCOUNTER — Other Ambulatory Visit (HOSPITAL_COMMUNITY)

## 2017-09-04 DIAGNOSIS — R9431 Abnormal electrocardiogram [ECG] [EKG]: Secondary | ICD-10-CM | POA: Diagnosis not present

## 2017-09-04 DIAGNOSIS — E079 Disorder of thyroid, unspecified: Secondary | ICD-10-CM | POA: Diagnosis not present

## 2017-09-04 DIAGNOSIS — R079 Chest pain, unspecified: Secondary | ICD-10-CM

## 2017-09-04 DIAGNOSIS — E039 Hypothyroidism, unspecified: Secondary | ICD-10-CM | POA: Diagnosis not present

## 2017-09-04 DIAGNOSIS — M069 Rheumatoid arthritis, unspecified: Secondary | ICD-10-CM | POA: Diagnosis not present

## 2017-09-04 DIAGNOSIS — Z79899 Other long term (current) drug therapy: Secondary | ICD-10-CM | POA: Diagnosis not present

## 2017-09-04 DIAGNOSIS — M059 Rheumatoid arthritis with rheumatoid factor, unspecified: Secondary | ICD-10-CM | POA: Diagnosis not present

## 2017-09-04 LAB — ECHOCARDIOGRAM COMPLETE
Height: 66 in
Weight: 3600 oz

## 2017-09-04 LAB — NM MYOCAR MULTI W/SPECT W/WALL MOTION / EF
Estimated workload: 7 METS
Exercise duration (min): 6 min
Exercise duration (sec): 1 s
MPHR: 178 {beats}/min
Peak BP: 15057 mmHg
Peak HR: 160 {beats}/min
Percent HR: 89 %
Rest HR: 100 {beats}/min

## 2017-09-04 LAB — HIV ANTIBODY (ROUTINE TESTING W REFLEX): HIV Screen 4th Generation wRfx: NONREACTIVE

## 2017-09-04 LAB — SEDIMENTATION RATE: Sed Rate: 32 mm/hr — ABNORMAL HIGH (ref 0–22)

## 2017-09-04 MED ORDER — COLCHICINE 0.6 MG PO TABS
0.6000 mg | ORAL_TABLET | Freq: Two times a day (BID) | ORAL | Status: DC
  Administered 2017-09-04: 0.6 mg via ORAL
  Filled 2017-09-04: qty 1

## 2017-09-04 MED ORDER — COLCHICINE 0.6 MG PO TABS: 0.6000 mg | ORAL_TABLET | Freq: Two times a day (BID) | ORAL | 0 refills | Status: DC

## 2017-09-04 MED ORDER — TECHNETIUM TC 99M TETROFOSMIN IV KIT
30.0000 | PACK | Freq: Once | INTRAVENOUS | Status: AC | PRN
  Administered 2017-09-04: 30 via INTRAVENOUS

## 2017-09-04 MED ORDER — MAGNESIUM OXIDE 400 (241.3 MG) MG PO TABS
400.0000 mg | ORAL_TABLET | Freq: Every day | ORAL | Status: DC
  Administered 2017-09-04: 400 mg via ORAL
  Filled 2017-09-04: qty 1

## 2017-09-04 MED ORDER — TECHNETIUM TC 99M TETROFOSMIN IV KIT
10.0000 | PACK | Freq: Once | INTRAVENOUS | Status: AC | PRN
  Administered 2017-09-04: 10 via INTRAVENOUS

## 2017-09-04 NOTE — Plan of Care (Signed)
Discharged to home. Med changes and f/u instructions discussed with pt. Verbalized understanding. No questions or concerns expressed.

## 2017-09-04 NOTE — Progress Notes (Signed)
Progress Note  Patient Name: Maria Wiley Date of Encounter: 09/04/2017  Primary Cardiologist: Olga Millers, MD  Subjective   [name] has continued to have chest pain overnight - constant, worse with deep breathing and lying flat.  Inpatient Medications    Scheduled Meds: . aspirin EC  325 mg Oral Daily  . celecoxib  200 mg Oral BID  . cholecalciferol  1,000 Units Oral Daily  . darifenacin  7.5 mg Oral Daily  . enoxaparin (LOVENOX) injection  40 mg Subcutaneous Q24H  . escitalopram  10 mg Oral Daily  . folic acid  1 mg Oral Daily  . leflunomide  20 mg Oral Daily  . levothyroxine  25 mcg Oral QAC breakfast  . linaclotide  145 mcg Oral Daily  . magnesium oxide  400 mg Oral Daily   Continuous Infusions: . sodium chloride 50 mL/hr at 09/03/17 1900   PRN Meds: acetaminophen, fentaNYL (SUBLIMAZE) injection, gi cocktail, ibuprofen, morphine injection, nitroGLYCERIN, ondansetron (ZOFRAN) IV, zolpidem   Vital Signs    Vitals:   09/04/17 0914 09/04/17 0916 09/04/17 0917 09/04/17 0918  BP:   130/61   Pulse: (!) 142 (!) 148 (!) 150 (!) 156  Resp:      Temp:      TempSrc:      SpO2:      Weight:      Height:        Intake/Output Summary (Last 24 hours) at 09/04/2017 0919 Last data filed at 09/04/2017 0300 Gross per 24 hour  Intake 909.17 ml  Output -  Net 909.17 ml   Filed Weights   09/04/17 0558  Weight: 225 lb (102.1 kg)    Physical Exam   GEN: Well nourished, well developed, in no acute distress.  HEENT: Grossly normal.  Neck: Supple, no JVD, carotid bruits, or masses. Cardiac: RRR, no murmurs, rubs, or gallops. No clubbing, cyanosis, edema.  Radials/DP/PT 2+ and equal bilaterally.  Respiratory:  Respirations regular and unlabored, diminished breath sounds @ bilat bases, otw, clear to auscultation bilaterally. GI: Soft, nontender, nondistended, BS + x 4. MS: no deformity or atrophy. Skin: warm and dry, no rash. Neuro:  Strength and sensation are  intact. Psych: AAOx3.  Normal affect.  Labs    Chemistry Recent Labs  Lab 09/03/17 1106  NA 136  K 3.8  CL 103  CO2 23  GLUCOSE 83  BUN 7  CREATININE 0.74  CALCIUM 9.1  GFRNONAA >60  GFRAA >60  ANIONGAP 10     Hematology Recent Labs  Lab 09/03/17 1106  WBC 8.0  RBC 4.54  HGB 11.5*  HCT 36.5  MCV 80.4  MCH 25.3*  MCHC 31.5  RDW 15.0  PLT 278    Cardiac Enzymes Recent Labs  Lab 09/03/17 1710 09/03/17 1807 09/03/17 2233  TROPONINI <0.03 <0.03 <0.03    Recent Labs  Lab 09/03/17 1117 09/03/17 1332  TROPIPOC 0.00 0.00     DDimer  Recent Labs  Lab 09/03/17 1106  DDIMER 0.57*     Radiology    Dg Chest 2 View  Result Date: 09/03/2017 CLINICAL DATA:  Chest pain since yesterday. She also states that she feels like something is stuck in her chest No other chest complaintsNo known chest HX EXAM: CHEST  2 VIEW COMPARISON:  None. FINDINGS: The heart size and mediastinal contours are within normal limits. Both lungs are clear. No pleural effusion or pneumothorax. The visualized skeletal structures are unremarkable. IMPRESSION: No active cardiopulmonary disease. Electronically Signed  By: Amie Portland M.D.   On: 09/03/2017 11:53   Ct Angio Chest Pe W And/or Wo Contrast  Result Date: 09/03/2017 CLINICAL DATA:  Left chest pain and shortness of breath since last night. EXAM: CT ANGIOGRAPHY CHEST WITH CONTRAST TECHNIQUE: Multidetector CT imaging of the chest was performed using the standard protocol during bolus administration of intravenous contrast. Multiplanar CT image reconstructions and MIPs were obtained to evaluate the vascular anatomy. CONTRAST:  100 ml ISOVUE-370 IOPAMIDOL (ISOVUE-370) INJECTION 76% COMPARISON:  Single-view of the chest earlier today. FINDINGS: Cardiovascular: No pulmonary embolus is identified. There is cardiomegaly. No pericardial effusion. No calcific atherosclerosis. No aneurysm. Mediastinum/Nodes: No enlarged mediastinal, hilar, or axillary  lymph nodes. Thyroid gland, trachea, and esophagus demonstrate no significant findings. Lungs/Pleura: The lungs demonstrate only mild dependent atelectasis. No pleural effusion. Upper Abdomen: Status post cholecystectomy.  No focal abnormality. Musculoskeletal: Negative. Review of the MIP images confirms the above findings. IMPRESSION: Negative for pulmonary embolus.  No acute disease. Cardiomegaly. Electronically Signed   By: Drusilla Kanner M.D.   On: 09/03/2017 15:21    Telemetry    Seen in nuc med - RSR - Personally Reviewed  ECG    Rsr, 93, subtle ST elev 1/aVL, antlat TWI. - Personally Reviewed  Cardiac Studies   lexiscan mv and echo pending today  Patient Profile     42 y.o. female w/ a h/o RA and hypothyroidism who was admitted 2/1 with pleuritic chest pain and has r/o for MI.  CTA neg for PE or other acute dzs.  Assessment & Plan    1.  Midsternal chest pain:  Pt presetned with midsternal c/p that is worse with deep breathing and lying flat.  Pain is constant.  Despite prolonged Ss, trop nl.  ECG w/ subtle ST elev in I/aVL and antlat twi.  For MV today.  Echo pending in setting of symptoms that appear to be most consistent with pericarditis.  CTA chest neg for PE or other acute dzs.  She is on chronic celebrex.  I will add colchicine.  2.  RA:  Per IM.  Signed, Nicolasa Ducking, NP  09/04/2017, 9:19 AM    For questions or updates, please contact   Please consult [email or web address] for contact info under Cardiology/STEMI.  The patient was seen and examined, and I agree with the history, physical exam, assessment and plan as documented above, with modifications as noted below. I have also personally reviewed all relevant documentation, old records, labs, and both radiographic and cardiovascular studies. I have also independently interpreted old and new ECG's.  Chest pain is provoked by deep inspiration and lying flat. By history, this appears to be consistent with pericarditis,  and there is an association with this given her h/o rheumatoid arthritis. Today's ECG demonstrates very subtle PR elevation in AVR and PR depression in precordial leads, also favoring pericarditis.  We have added colchicine. Echo and stress test results pending at this time.   Prentice Docker, MD, [facility]  09/04/2017 11:11 AM

## 2017-09-04 NOTE — Care Management Note (Signed)
Case Management Note  Patient Details  Name: Maria Wiley MRN: [id number] Date of Birth: 02-04-1976  Subjective/Objective:      Pt presented for CP.  Pt recently moved to [facility] from [place] and is in the process of setting up PCP.  Pt states she has an appt at [facility] but cannot be seen until records are faxed.  Pt has been requesting records from old office for over 3 weeks, but will call again Monday.    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  Advised pt to call billing office ASAP on Monday to give information.            Action/Plan: No CM needs at present.  Expected Discharge Date:                  Expected Discharge Plan:  Home/Self Care  In-House Referral:  NA  Discharge planning Services  CM Consult  Post Acute Care Choice:  NA Choice offered to:     DME Arranged:    DME Agency:     HH Arranged:    HH Agency:     Status of Service:  In process, will continue to follow  If discussed at Long Length of Stay Meetings, dates discussed:    Additional Comments:  Verdene Lennert, RN 09/04/2017, 11:55 AM

## 2017-09-04 NOTE — Discharge Summary (Signed)
Physician Discharge Summary  Maria Wiley QRF:[id number] DOB: June 10, 1976 DOA: 09/03/2017  PCP: Patient, No Pcp Per  Admit date: 09/03/2017 Discharge date: 09/04/2017  Time spent: 35 minutes  Recommendations for Outpatient Follow-up:  1. Colchicine started for pericarditis 2. Follow ESR and CRP as OP to ensure that the disease activity of her RA is not severe  Discharge Diagnoses:  Principal Problem:   Chest pain Active Problems:   RA (rheumatoid arthritis) ([place])   Thyroid disease   Discharge Condition: improved  Diet recommendation: hh low salt  Filed Weights   09/04/17 0558  Weight: 102.1 kg (225 lb)    History of present illness:  42 year old female rheumatoid arthritis, hypothyroidism admitted with chest pain and because of concerns had stress Myoview was done-given patient's positional changes diagnosis on discharge was felt to be pericarditis patient was placed on colchicine I obtained an ESR CRP to denote disease activity stabilized on discharge patient was placed on colchicine and was stabilized for discharge  Hospital Course:  See above  Procedures:  Echocardiogram EF 65%  Myoview stress was EF 52%   Consultations:  Cardiology  Discharge Exam: Vitals:   09/04/17 0918 09/04/17 1451  BP:  124/71  Pulse: (!) 156 (!) 105  Resp:    Temp:  99.6 F (37.6 C)  SpO2:  97%    General: eomi ncat  Cardiovascular: s1 s2 no m/r/g--some reproducible pain on pressure and position changes Respiratory: clear  Discharge Instructions    Allergies as of 09/04/2017   No Known Allergies     Medication List    STOP taking these medications   folic acid 1 MG tablet Commonly known as:  FOLVITE   ibuprofen 200 MG tablet Commonly known as:  ADVIL,MOTRIN     TAKE these medications   celecoxib 200 MG capsule Commonly known as:  CELEBREX Take 200 mg by mouth 2 (two) times daily.   cholecalciferol 1000 units tablet Commonly known as:  VITAMIN D Take 1,000 Units  by mouth daily.   colchicine 0.6 MG tablet Take 1 tablet (0.6 mg total) by mouth 2 (two) times daily.   ENBREL 50 MG/ML injection Generic drug:  etanercept Inject 50 mg into the skin once a week.   escitalopram 20 MG tablet Commonly known as:  LEXAPRO Take 10 mg by mouth daily.   leflunomide 20 MG tablet Commonly known as:  ARAVA Take 20 mg by mouth daily.   levothyroxine 25 MCG tablet Commonly known as:  SYNTHROID, LEVOTHROID Take 25 mcg by mouth daily before breakfast.   LINZESS 145 MCG Caps capsule Generic drug:  linaclotide Take 145 mcg by mouth daily.   Magnesium 250 MG Tabs Take 250 mg by mouth daily.   Methotrexate (PF) 17.5 MG/0.35ML Soaj Inject 0.8 mLs into the skin.   VESICARE 10 MG tablet Generic drug:  solifenacin Take 10 mg by mouth daily.      No Known Allergies Follow-up Information    [facility]. Schedule an appointment as soon as possible for a visit in 3 day(s).   Contact information: [place] [address] [phone]           The results of significant diagnostics from this hospitalization (including imaging, microbiology, ancillary and laboratory) are listed below for reference.    Significant Diagnostic Studies: Dg Chest 2 View  Result Date: 09/03/2017 CLINICAL DATA:  Chest pain since yesterday. She also states that she feels like something is stuck  in her chest No other chest complaintsNo known chest HX EXAM: CHEST  2 VIEW COMPARISON:  None. FINDINGS: The heart size and mediastinal contours are within normal limits. Both lungs are clear. No pleural effusion or pneumothorax. The visualized skeletal structures are unremarkable. IMPRESSION: No active cardiopulmonary disease. Electronically Signed   By: Lajean Manes M.D.   On: 09/03/2017 11:53   Ct Angio Chest Pe W And/or Wo Contrast  Result Date: 09/03/2017 CLINICAL DATA:  Left chest pain and  shortness of breath since last night. EXAM: CT ANGIOGRAPHY CHEST WITH CONTRAST TECHNIQUE: Multidetector CT imaging of the chest was performed using the standard protocol during bolus administration of intravenous contrast. Multiplanar CT image reconstructions and MIPs were obtained to evaluate the vascular anatomy. CONTRAST:  100 ml ISOVUE-370 IOPAMIDOL (ISOVUE-370) INJECTION 76% COMPARISON:  Single-view of the chest earlier today. FINDINGS: Cardiovascular: No pulmonary embolus is identified. There is cardiomegaly. No pericardial effusion. No calcific atherosclerosis. No aneurysm. Mediastinum/Nodes: No enlarged mediastinal, hilar, or axillary lymph nodes. Thyroid gland, trachea, and esophagus demonstrate no significant findings. Lungs/Pleura: The lungs demonstrate only mild dependent atelectasis. No pleural effusion. Upper Abdomen: Status post cholecystectomy.  No focal abnormality. Musculoskeletal: Negative. Review of the MIP images confirms the above findings. IMPRESSION: Negative for pulmonary embolus.  No acute disease. Cardiomegaly. Electronically Signed   By: Inge Rise M.D.   On: 09/03/2017 15:21   Nm Myocar Multi W/spect W/wall Motion / Ef  Result Date: 09/04/2017 CLINICAL DATA:  Chest pain. Shortness of breath. Family history of CAD. EXAM: MYOCARDIAL IMAGING WITH SPECT (REST AND EXERCISE) GATED LEFT VENTRICULAR WALL MOTION STUDY LEFT VENTRICULAR EJECTION FRACTION TECHNIQUE: Standard myocardial SPECT imaging was performed after resting intravenous injection of 10 mCi Tc-29mtetrofosmin. Subsequently, exercise tolerance test was performed by the patient under the supervision of the Cardiology staff. At peak-stress, 30 mCi Tc-973metrofosmin was injected intravenously and standard myocardial SPECT imaging was performed. Patient was able to achieve target heart rate. Quantitative gated imaging was also performed to evaluate left ventricular wall motion, and estimate left ventricular ejection fraction.  COMPARISON:  Chest radiograph - 09/03/2017; chest CT - 09/03/2017 FINDINGS: Raw images: Moderate GI attenuation is seen, worse on provided rest images. Moderate breast and chest wall attenuation is seen on both provided rest stress images. No significant patient motion artifact. Perfusion: There is a minimal amount of attenuation involving the apical aspect of the anterior wall of the left ventricle which improves on the provided rest images. No definitive scintigraphic evidence of prior infarction or pharmacologically induced ischemia. Wall Motion: Left ventricle some moderately dilated. There is mild global hypokinesia without geographic wall motion abnormality. Left Ventricular Ejection Fraction: 52 % End diastolic volume 12470l End systolic volume 59 ml IMPRESSION: 1. No scintigraphic evidence of prior infarction or exercise induced ischemia. 2. Moderately dilated left ventricle. 3. Mild global hypokinesia without geographic wall motion abnormality. Ejection fraction - 52%. Electronically Signed   By: JoSandi Mariscal.D.   On: 09/04/2017 11:26    Microbiology: No results found for this or any previous visit (from the past 240 hour(s)).   Labs: Basic Metabolic Panel: Recent Labs  Lab 09/03/17 1106  NA 136  K 3.8  CL 103  CO2 23  GLUCOSE 83  BUN 7  CREATININE 0.74  CALCIUM 9.1   Liver Function Tests: No results for input(s): AST, ALT, ALKPHOS, BILITOT, PROT, ALBUMIN in the last 168 hours. No results for input(s): LIPASE, AMYLASE in the last 168 hours. No results  for input(s): AMMONIA in the last 168 hours. CBC: Recent Labs  Lab 09/03/17 1106  WBC 8.0  HGB 11.5*  HCT 36.5  MCV 80.4  PLT 278   Cardiac Enzymes: Recent Labs  Lab 09/03/17 1710 09/03/17 1807 09/03/17 2233  TROPONINI <0.03 <0.03 <0.03   BNP: BNP (last 3 results) No results for input(s): BNP in the last 8760 hours.  ProBNP (last 3 results) No results for input(s): PROBNP in the last 8760 hours.  CBG: No  results for input(s): GLUCAP in the last 168 hours.     Signed:  Nita Sells MD   [facility] 09/04/2017, 3:26 PM

## 2017-09-04 NOTE — Plan of Care (Signed)
Completed Stress Test. Results pending. Plan 2 D echo today. Colchicine ordered for chest pain.

## 2017-09-04 NOTE — Progress Notes (Signed)
   Maria Wiley presented for an exercise myoview today.  No immediate complications.  Stress imaging is pending at this time.  Nicolasa Ducking, NP 09/04/2017, 9:28 AM

## 2017-09-04 NOTE — Progress Notes (Signed)
*  PRELIMINARY RESULTS* Echocardiogram 2D Echocardiogram has been performed.  Maria Wiley 09/04/2017, 3:12 PM

## 2017-09-04 NOTE — Discharge Instructions (Signed)
If you are unable to establish care with your chosen primary doctor at [facility], please call [facility] [phone] to find PCP.   If you were given medicines take as directed.  If you are on coumadin or contraceptives realize their levels and effectiveness is altered by many different medicines.  If you have any reaction (rash, tongues swelling, other) to the medicines stop taking and see a physician.    If your blood pressure was elevated in the ER make sure you follow up for management with a primary doctor or return for chest pain, shortness of breath or stroke symptoms.  Please follow up as directed and return to the ER or see a physician for new or worsening symptoms.  Thank you. Vitals:   09/03/17 1108  BP: 123/83  Pulse: 73  Resp: 16  Temp: 98 F (36.7 C)  TempSrc: Oral  SpO2: 100%

## 2017-09-05 LAB — HIGH SENSITIVITY CRP: CRP, High Sensitivity: 79.07 mg/L — ABNORMAL HIGH (ref 0.00–3.00)

## 2017-09-08 ENCOUNTER — Ambulatory Visit: Admitting: Cardiology

## 2017-09-16 ENCOUNTER — Ambulatory Visit: Admitting: Adult Health

## 2017-09-17 ENCOUNTER — Ambulatory Visit (INDEPENDENT_AMBULATORY_CARE_PROVIDER_SITE_OTHER): Admitting: Physician Assistant

## 2017-09-17 ENCOUNTER — Encounter: Payer: Self-pay | Admitting: Physician Assistant

## 2017-09-17 VITALS — BP 118/75 | HR 71 | Ht 67.0 in | Wt 226.7 lb

## 2017-09-17 DIAGNOSIS — I319 Disease of pericardium, unspecified: Secondary | ICD-10-CM | POA: Diagnosis not present

## 2017-09-17 LAB — SEDIMENTATION RATE: Sed Rate: 18 mm/hr (ref 0–32)

## 2017-09-17 NOTE — Patient Instructions (Signed)
Medication Instructions: Your physician recommends that you continue on your current medications as directed. Please refer to the Current Medication list given to you today.  Labwork: Your physician recommends that you return for lab work today--ESR   Follow-Up: Your physician recommends that you schedule a follow-up appointment in: 3 months with Dr. Stanford Breed.

## 2017-09-17 NOTE — Progress Notes (Signed)
Inflammatory marker returned to normal. This is expected given the medication she is on.

## 2017-09-17 NOTE — Progress Notes (Signed)
Cardiology Office Note    Date:  09/18/2017   ID:  Maria Maria, DOB 24-Nov-1975, MRN [id number]  PCP:  Lacie Draft, NP  Cardiologist:  Dr. Stanford Breed   Chief Complaint  Patient presents with  . Follow-up    seen for Dr. Stanford Breed    History of Present Illness:  Maria Maria is a 42 y.o. female with PMH of RA and hypothyroidism who was recently admitted on 09/03/2017 for evaluation of chest pain.  She has retired from [employer] but remain active at home.  Initial d-dimer was 0.57, however CTA of the chest was negative for PE.  EKG showed nonspecific T wave abnormality.  Serial troponin was negative.  Her chest pain is worse with deep inspiration and laying flat.  There was some suspicion of pericarditis.  She is on chronic Celebrex.  Colchicine was added during this admission.  Sed rate was elevated at 32.  C-reactive protein was also elevated at 79.  Myoview obtained on 09/04/2017 showed EF 52%, mild global hypokinesia without wall motion abnormality, no evidence of prior infarction or exercise-induced ischemia.  Echocardiogram showed EF 60-65%, mild LVH, PA peak pressure 21 mmHg.  [name] presents today for cardiology office visit.  She continued to have very mild dyspnea with activity when she walk her dog.  However her chest pain has significantly improved after starting on colchicine.  She can have upto 21-monthcourse of colchicine.  She is not on ibuprofen due to the need for Celebrex.  She questioned any why her folic acid was discontinued, this was given to her by her rheumatologist, I am not aware of any interaction between folic acid and colchicine.  I also do not see any hospital note mentioning discontinuation of folic acid other than the discharge summary.  I think she can restart on the folic acid if needed.     Past Medical History:  Diagnosis Date  . Chest pain   . RA (rheumatoid arthritis) ([place]   . Thyroid disease     Past Surgical History:  Procedure Laterality  Date  . ABDOMINAL HYSTERECTOMY    . APPENDECTOMY    . CHOLECYSTECTOMY      Current Medications: Outpatient Medications Prior to Visit  Medication Sig Dispense Refill  . celecoxib (CELEBREX) 200 MG capsule Take 200 mg by mouth 2 (two) times daily.    . cholecalciferol (VITAMIN D) 1000 units tablet Take 1,000 Units by mouth daily.    . colchicine 0.6 MG tablet Take 1 tablet (0.6 mg total) by mouth 2 (two) times daily. 60 tablet 0  . escitalopram (LEXAPRO) 20 MG tablet Take 10 mg by mouth daily.    .Marland Kitchenetanercept (ENBREL) 50 MG/ML injection Inject 50 mg into the skin once a week.    . leflunomide (ARAVA) 20 MG tablet Take 20 mg by mouth daily.    .Marland Kitchenlevothyroxine (SYNTHROID, LEVOTHROID) 25 MCG tablet Take 25 mcg by mouth daily before breakfast.    . LINZESS 145 MCG CAPS capsule Take 145 mcg by mouth daily.    . Magnesium 250 MG TABS Take 250 mg by mouth daily.    . Methotrexate, PF, 17.5 MG/0.35ML SOAJ Inject 0.8 mLs into the skin.    . VESICARE 10 MG tablet Take 10 mg by mouth daily.     No facility-administered medications prior to visit.      Allergies:   Patient has no known allergies.   Social History   Socioeconomic History  . Marital  status: Married    Spouse name: None  . Number of children: None  . Years of education: None  . Highest education level: None  Social Needs  . Financial resource strain: None  . Food insecurity - worry: None  . Food insecurity - inability: None  . Transportation needs - medical: None  . Transportation needs - non-medical: None  Occupational History  . None  Tobacco Use  . Smoking status: Never Smoker  . Smokeless tobacco: Never Used  Substance and Sexual Activity  . Alcohol use: None  . Drug use: None  . Sexual activity: None  Other Topics Concern  . None  Social History Narrative  . None     Family History:  The patient's family history includes CAD in her maternal grandfather and paternal grandfather; Hypertension in her mother.     ROS:   Please see the history of present illness.    ROS All other systems reviewed and are negative.   PHYSICAL EXAM:   VS:  BP 118/75   Pulse 71   Ht _0  (1.702 m)   Wt 226 lb 11.2 oz (102.8 kg)   BMI 35.51 kg/m    GEN: Well nourished, well developed, in no acute distress  HEENT: normal  Neck: no JVD, carotid bruits, or masses Cardiac: RRR; no murmurs, rubs, or gallops,no edema  Respiratory:  clear to auscultation bilaterally, normal work of breathing GI: soft, nontender, nondistended, + BS MS: no deformity or atrophy  Skin: warm and dry, no rash Neuro:  Alert and Oriented x 3, Strength and sensation are intact Psych: euthymic mood, full affect  Wt Readings from Last 3 Encounters:  09/17/17 226 lb 11.2 oz (102.8 kg)  09/04/17 225 lb (102.1 kg)      Studies/Labs Reviewed:   EKG:  EKG is not ordered today.   Recent Labs: 09/03/2017: BUN 7; Creatinine, Ser 0.74; Hemoglobin 11.5; Platelets 278; Potassium 3.8; Sodium 136; TSH 0.943   Lipid Panel    Component Value Date/Time   CHOL 228 (H) 09/03/2017 1807   TRIG 82 09/03/2017 1807   HDL 47 09/03/2017 1807   CHOLHDL 4.9 09/03/2017 1807   VLDL 16 09/03/2017 1807   LDLCALC 165 (H) 09/03/2017 1807    Additional studies/ records that were reviewed today include:   Myoview 09/04/2017 IMPRESSION: 1. No scintigraphic evidence of prior infarction or exercise induced ischemia. 2. Moderately dilated left ventricle. 3. Mild global hypokinesia without geographic wall motion abnormality. Ejection fraction - 52%.     Echo 09/04/2017 LV EF: 60% -   65% Study Conclusions  - Left ventricle: The cavity size was normal. Posterior wall   thickness was increased in a pattern of mild LVH. Systolic   function was normal. The estimated ejection fraction was in the   range of 60% to 65%. Wall motion was normal; there were no   regional wall motion abnormalities. Left ventricular diastolic   function parameters were normal. -  Aortic valve: There was no regurgitation. - Mitral valve: Transvalvular velocity was within the normal range.   There was no evidence for stenosis. There was no regurgitation. - Right ventricle: The cavity size was normal. Wall thickness was   normal. Systolic function was normal. - Atrial septum: No defect or patent foramen ovale was identified   by color flow Doppler. - Tricuspid valve: There was trivial regurgitation. - Pulmonary arteries: Systolic pressure was within the normal   range. PA peak pressure: 21 mm Hg (S).  ASSESSMENT:    1. Pericarditis, unspecified chronicity, unspecified type      PLAN:  In order of problems listed above:  1. Pericarditis: Patient symptom has significantly improved, only has mild chest discomfort.  Continue on colchicine, can complete up to a 29-monthcourse.  Ibuprofen due to the need for Celebrex.  She continued to have some dyspnea on exertion at baseline, recent Myoview was negative.  There was no pericardial effusion on recent echocardiogram.    Medication Adjustments/Labs and Tests Ordered: Current medicines are reviewed at length with the patient today.  Concerns regarding medicines are outlined above.  Medication changes, Labs and Tests ordered today are listed in the Patient Instructions below. Patient Instructions  Medication Instructions: Your physician recommends that you continue on your current medications as directed. Please refer to the Current Medication list given to you today.  Labwork: Your physician recommends that you return for lab work today--ESR   Follow-Up: Your physician recommends that you schedule a follow-up appointment in: 3 months with Dr. CStanford Breed      SHilbert Corrigan PUtah 09/18/2017 5:00 PM    [place]  [id number]: [phone] Fax: [phone]

## 2017-09-18 ENCOUNTER — Encounter: Payer: Self-pay | Admitting: Physician Assistant

## 2017-09-22 ENCOUNTER — Ambulatory Visit: Admit: 2017-09-22 | Discharge: 2017-09-23 | Attending: Family | Primary: Family

## 2017-09-22 DIAGNOSIS — I011 Acute rheumatic endocarditis: Secondary | ICD-10-CM

## 2017-09-22 DIAGNOSIS — F411 Generalized anxiety disorder: Secondary | ICD-10-CM

## 2017-09-22 DIAGNOSIS — Z1239 Encounter for other screening for malignant neoplasm of breast: Secondary | ICD-10-CM

## 2017-09-22 DIAGNOSIS — N3281 Overactive bladder: Secondary | ICD-10-CM

## 2017-09-22 DIAGNOSIS — K581 Irritable bowel syndrome with constipation: Secondary | ICD-10-CM

## 2017-09-22 DIAGNOSIS — M0609 Rheumatoid arthritis without rheumatoid factor, multiple sites: Principal | ICD-10-CM

## 2017-09-22 MED ORDER — LEFLUNOMIDE 20 MG TABLET
ORAL_TABLET | Freq: Every day | ORAL | 1 refills | 0.00000 days | Status: CP
Start: 2017-09-22 — End: ?

## 2017-09-22 MED ORDER — ESCITALOPRAM 20 MG TABLET
ORAL_TABLET | Freq: Every day | ORAL | 1 refills | 0.00000 days | Status: CP
Start: 2017-09-22 — End: ?

## 2017-09-24 MED ORDER — CEPHALEXIN 500 MG CAPSULE
ORAL_CAPSULE | Freq: Two times a day (BID) | ORAL | 0 refills | 0.00000 days | Status: CP
Start: 2017-09-24 — End: 2017-10-04

## 2017-10-12 ENCOUNTER — Encounter
Admit: 2017-10-12 | Discharge: 2017-10-13 | Payer: TRICARE (CHAMPUS) | Attending: Cardiovascular Disease | Primary: Cardiovascular Disease

## 2017-10-12 DIAGNOSIS — I309 Acute pericarditis, unspecified: Secondary | ICD-10-CM

## 2017-10-12 DIAGNOSIS — I011 Acute rheumatic endocarditis: Principal | ICD-10-CM

## 2017-10-22 ENCOUNTER — Encounter: Admit: 2017-10-22 | Discharge: 2017-10-22 | Payer: TRICARE (CHAMPUS)

## 2017-10-22 DIAGNOSIS — M0609 Rheumatoid arthritis without rheumatoid factor, multiple sites: Principal | ICD-10-CM

## 2017-10-22 DIAGNOSIS — M459 Ankylosing spondylitis of unspecified sites in spine: Principal | ICD-10-CM

## 2017-10-22 DIAGNOSIS — G8929 Other chronic pain: Secondary | ICD-10-CM

## 2017-10-22 DIAGNOSIS — Z7189 Other specified counseling: Secondary | ICD-10-CM

## 2017-10-22 DIAGNOSIS — Z79899 Other long term (current) drug therapy: Secondary | ICD-10-CM

## 2017-10-22 DIAGNOSIS — M25562 Pain in left knee: Secondary | ICD-10-CM

## 2017-10-22 MED ORDER — NAPROXEN 500 MG TABLET
ORAL_TABLET | Freq: Two times a day (BID) | ORAL | 2 refills | 0.00000 days | Status: CP
Start: 2017-10-22 — End: 2018-10-22

## 2017-10-22 MED ORDER — ADALIMUMAB 40 MG/0.8 ML SUBCUTANEOUS PEN KIT
SUBCUTANEOUS | 5 refills | 0.00000 days | Status: CP
Start: 2017-10-22 — End: 2017-10-22

## 2017-10-22 MED ORDER — ADALIMUMAB 40 MG/0.8 ML SUBCUTANEOUS PEN KIT: 40 mg | each | 5 refills | 0 days | Status: AC

## 2017-10-25 LAB — CCP IGG ANTIBODIES: Lab: NEGATIVE

## 2017-10-25 LAB — CYCLIC CITRUL PEPTIDE ANTIBODY, IGG
CCP ANTIBODIES: <1 {ELISA'U} (ref <7.0)
CCP IGG ANTIBODIES: NEGATIVE

## 2017-10-25 NOTE — Unmapped (Signed)
Per test claim for HUMIRA at the [facility], patient needs Medication Assistance Program for Prior Authorization.

## 2017-10-26 NOTE — Unmapped (Signed)
Results from your remaining lab tests that we were waiting on are now available and were normal or without significant abnormalities.     Please let us know if you have any questions.    Dr. Sullivan Lone

## 2017-11-02 NOTE — Unmapped (Unsigned)
[facility] Medication Referral: PA APPROVED    Medication (Brand/Generic): HUMIRA    Initial FSI Test Claim completed with resulted information below:  No PA required  Patient ABLE to fill at [facility]  Insurance Company:  [employer]  Anticipated Copay: $24  Is anticipated copay with a copay card or grant? NO    As Co-pay is under $100 defined limit, per policy there will be no further investigation of need for financial assistance at this time unless patient requests. This referral has been communicated to the provider and handed off to the [facility] team for further processing and filling of prescribed medication.   ______________________________________________________________________  Please utilize this referral for viewing purposes as it will serve as the central location for all relevant documentation and updates.

## 2017-11-12 MED ORDER — EMPTY CONTAINER
Freq: Once | 3 refills | 0 days | Status: CP | PRN
Start: 2017-11-12 — End: ?

## 2017-11-12 NOTE — Unmapped (Signed)
[facility] - Pharmacist Onboarding Note    Specialty Medication: Humira     Humira approved and ready for shipment @ [facility].  Patient has been using [facility] and may get better pricing giving contract with [employer].   Humira script transferred to [facility] per patient's request.  She is aware of how to use Humira and its side effect profile.  She declined further med counseling.     Chelsea Aus

## 2017-11-24 MED ORDER — ADALIMUMAB 40 MG/0.8 ML SUBCUTANEOUS PEN KIT
SUBCUTANEOUS | 5 refills | 0 days | Status: CP
Start: 2017-11-24 — End: ?

## 2017-12-06 ENCOUNTER — Ambulatory Visit (INDEPENDENT_AMBULATORY_CARE_PROVIDER_SITE_OTHER): Payer: Self-pay | Admitting: Family Medicine

## 2017-12-06 VITALS — BP 120/78 | HR 72 | Temp 98.2°F | Resp 16

## 2017-12-06 DIAGNOSIS — E039 Hypothyroidism, unspecified: Secondary | ICD-10-CM

## 2017-12-06 MED ORDER — LEVOTHYROXINE SODIUM 25 MCG PO TABS: 25.0000 ug | ORAL_TABLET | Freq: Every day | ORAL | 0 refills | Status: DC

## 2017-12-06 NOTE — Patient Instructions (Signed)
Levothyroxine tablets What is this medicine? LEVOTHYROXINE (lee voe thye ROX een) is a thyroid hormone. This medicine can improve symptoms of thyroid deficiency such as slow speech, lack of energy, weight gain, hair loss, dry skin, and feeling cold. It also helps to treat goiter (an enlarged thyroid gland). It is also used to treat some kinds of thyroid cancer along with surgery and other medicines. This medicine may be used for other purposes; ask your health care provider or pharmacist if you have questions. COMMON BRAND NAME(S): Estre, Levo-T, Levothroid, Levoxyl, Synthroid, Thyro-Tabs, Unithroid What should I tell my health care provider before I take this medicine? They need to know if you have any of these conditions: -angina -blood clotting problems -diabetes -dieting or on a weight loss program -fertility problems -heart disease -high levels of thyroid hormone -pituitary gland problem -previous heart attack -an unusual or allergic reaction to levothyroxine, thyroid hormones, other medicines, foods, dyes, or preservatives -pregnant or trying to get pregnant -breast-feeding How should I use this medicine? Take this medicine by mouth with plenty of water. It is best to take on an empty stomach, at least 30 minutes before or 2 hours after food. Follow the directions on the prescription label. Take at the same time each day. Do not take your medicine more often than directed. Contact your pediatrician regarding the use of this medicine in children. While this drug may be prescribed for children and infants as young as a few days of age for selected conditions, precautions do apply. For infants, you may crush the tablet and place in a small amount of (5-10 ml or 1 to 2 teaspoonfuls) of water, breast milk, or non-soy based infant formula. Do not mix with soy-based infant formula. Give as directed. Overdosage: If you think you have taken too much of this medicine contact a poison control center  or emergency room at once. NOTE: This medicine is only for you. Do not share this medicine with others. What if I miss a dose? If you miss a dose, take it as soon as you can. If it is almost time for your next dose, take only that dose. Do not take double or extra doses. What may interact with this medicine? -amiodarone -antacids -anti-thyroid medicines -calcium supplements -carbamazepine -cholestyramine -colestipol -digoxin -female hormones, including contraceptive or birth control pills -iron supplements -ketamine -liquid nutrition products like Ensure -medicines for colds and breathing difficulties -medicines for diabetes -medicines for mental depression -medicines or herbals used to decrease weight or appetite -phenobarbital or other barbiturate medications -phenytoin -prednisone or other corticosteroids -rifabutin -rifampin -soy isoflavones -sucralfate -theophylline -warfarin This list may not describe all possible interactions. Give your health care provider a list of all the medicines, herbs, non-prescription drugs, or dietary supplements you use. Also tell them if you smoke, drink alcohol, or use illegal drugs. Some items may interact with your medicine. What should I watch for while using this medicine? Be sure to take this medicine with plenty of fluids. Some tablets may cause choking, gagging, or difficulty swallowing from the tablet getting stuck in your throat. Most of these problems disappear if the medicine is taken with the right amount of water or other fluids. Do not switch brands of this medicine unless your health care professional agrees with the change. Ask questions if you are uncertain. You will need regular exams and occasional blood tests to check the response to treatment. If you are receiving this medicine for an underactive thyroid, it may be several weeks   before you notice an improvement. Check with your doctor or health care professional if your  symptoms do not improve. It may be necessary for you to take this medicine for the rest of your life. Do not stop using this medicine unless your doctor or health care professional advises you to. This medicine can affect blood sugar levels. If you have diabetes, check your blood sugar as directed. You may lose some of your hair when you first start treatment. With time, this usually corrects itself. If you are going to have surgery, tell your doctor or health care professional that you are taking this medicine. What side effects may I notice from receiving this medicine? Side effects that you should report to your doctor or health care professional as soon as possible: -allergic reactions like skin rash, itching or hives, swelling of the face, lips, or tongue -chest pain -excessive sweating or intolerance to heat -fast or irregular heartbeat -nervousness -skin rash or hives -swelling of ankles, feet, or legs -tremors Side effects that usually do not require medical attention (report to your doctor or health care professional if they continue or are bothersome): -changes in appetite -changes in menstrual periods -diarrhea -hair loss -headache -trouble sleeping -weight loss This list may not describe all possible side effects. Call your doctor for medical advice about side effects. You may report side effects to FDA at 1-800-FDA-1088. Where should I keep my medicine? Keep out of the reach of children. Store at room temperature between 15 and 30 degrees C (59 and 86 degrees F). Protect from light and moisture. Keep container tightly closed. Throw away any unused medicine after the expiration date. NOTE: This sheet is a summary. It may not cover all possible information. If you have questions about this medicine, talk to your doctor, pharmacist, or health care provider.  2018 Elsevier/Gold Standard (2008-10-26 14:28:07)  

## 2017-12-06 NOTE — Progress Notes (Signed)
Maria Wiley is a 42 y.o. female who presents today with concerns of medication refill for chronic condition of hypothyroidism. She reports routine medical care under the [place] and is pending a new PCP appointment since obtaining employer health insurance.  Review of Systems  Constitutional: Negative for chills, fever and malaise/fatigue.  HENT: Negative for congestion, ear discharge, ear pain, sinus pain and sore throat.   Eyes: Negative.   Respiratory: Negative for cough, sputum production and shortness of breath.   Cardiovascular: Negative.  Negative for chest pain.  Gastrointestinal: Negative for abdominal pain, diarrhea, nausea and vomiting.  Genitourinary: Negative for dysuria, frequency, hematuria and urgency.  Musculoskeletal: Negative for myalgias.  Skin: Negative.   Neurological: Negative for headaches.  Endo/Heme/Allergies: Negative.   Psychiatric/Behavioral: Negative.     O: Vitals:   12/06/17 1708  BP: 120/78  Pulse: 72  Resp: 16  Temp: 98.2 F (36.8 C)  SpO2: 99%     Physical Exam  Constitutional: She is oriented to person, place, and time. She appears well-developed and well-nourished. She is active.  Non-toxic appearance. She does not have a sickly appearance.  HENT:  Head: Normocephalic.  Right Ear: Hearing and external ear normal.  Left Ear: Hearing and external ear normal.  Nose: Nose normal.  Neck: Normal range of motion. Neck supple.  Cardiovascular: Normal rate and normal pulses.  Pulmonary/Chest: Effort normal.  Abdominal: Soft.  Neurological: She is alert and oriented to person, place, and time.  Psychiatric: She has a normal mood and affect.    A: 1. Hypothyroidism, unspecified type     P: Exam findings, diagnosis etiology and medication use and indications reviewed with patient. Follow- Up and discharge instructions provided. No emergent/urgent issues found on exam.  Patient verbalized understanding of information provided and agrees with  plan of care (POC), all questions answered.  1. Hypothyroidism, unspecified type - levothyroxine (SYNTHROID, LEVOTHROID) 25 MCG tablet; Take 1 tablet (25 mcg total) by mouth daily before breakfast.

## 2017-12-21 ENCOUNTER — Telehealth: Payer: Self-pay | Admitting: Internal Medicine

## 2017-12-21 NOTE — Telephone Encounter (Signed)
Copied from CRM [id number]. Topic: Appointment Scheduling - Prior Auth Required for Appointment >> Dec 21, 2017  3:11 PM Maria Wiley, [name] wrote: No appointment has been scheduled. Patient is requesting NP appointment with Dr. Okey Dupre due to insurance. Per scheduling protocol, this appointment requires a prior authorization prior to scheduling. Please call patient back if Dr. Okey Dupre can take her in before 01/31/18 and have a CPE done also.  Route to department's PEC pool.

## 2017-12-21 NOTE — Telephone Encounter (Signed)
Dr. Okey Dupre,  Would you be willing to see this patient to establish care?

## 2017-12-24 NOTE — Telephone Encounter (Signed)
Appointment scheduled.

## 2017-12-24 NOTE — Telephone Encounter (Signed)
Fine, no more than 1 new patient per day 

## 2017-12-28 ENCOUNTER — Ambulatory Visit: Admitting: Cardiology

## 2018-01-14 ENCOUNTER — Ambulatory Visit (INDEPENDENT_AMBULATORY_CARE_PROVIDER_SITE_OTHER): Payer: No Typology Code available for payment source | Admitting: Internal Medicine

## 2018-01-14 ENCOUNTER — Encounter: Payer: Self-pay | Admitting: Internal Medicine

## 2018-01-14 ENCOUNTER — Other Ambulatory Visit (INDEPENDENT_AMBULATORY_CARE_PROVIDER_SITE_OTHER): Payer: No Typology Code available for payment source

## 2018-01-14 VITALS — BP 130/90 | HR 63 | Temp 98.5°F | Ht 67.0 in | Wt 230.0 lb

## 2018-01-14 DIAGNOSIS — M25562 Pain in left knee: Secondary | ICD-10-CM | POA: Diagnosis not present

## 2018-01-14 DIAGNOSIS — K582 Mixed irritable bowel syndrome: Secondary | ICD-10-CM

## 2018-01-14 DIAGNOSIS — M459 Ankylosing spondylitis of unspecified sites in spine: Secondary | ICD-10-CM

## 2018-01-14 DIAGNOSIS — Z0000 Encounter for general adult medical examination without abnormal findings: Secondary | ICD-10-CM | POA: Diagnosis not present

## 2018-01-14 DIAGNOSIS — E039 Hypothyroidism, unspecified: Secondary | ICD-10-CM | POA: Diagnosis not present

## 2018-01-14 DIAGNOSIS — M0600 Rheumatoid arthritis without rheumatoid factor, unspecified site: Secondary | ICD-10-CM

## 2018-01-14 LAB — LIPID PANEL
Cholesterol: 184 mg/dL (ref 0–200)
HDL: 37.7 mg/dL — ABNORMAL LOW (ref >39.00)
LDL Cholesterol: 129 mg/dL — ABNORMAL HIGH (ref 0–99)
NonHDL: 146.7
Total CHOL/HDL Ratio: 5
Triglycerides: 90 mg/dL (ref 0.0–149.0)
VLDL: 18 mg/dL (ref 0.0–40.0)

## 2018-01-14 LAB — T4, FREE: Free T4: 0.97 ng/dL (ref 0.60–1.60)

## 2018-01-14 LAB — TSH: TSH: 0.83 u[IU]/mL (ref 0.35–4.50)

## 2018-01-14 MED ORDER — FOLIC ACID 1 MG PO TABS: 1.0000 mg | ORAL_TABLET | Freq: Every day | ORAL | 3 refills | Status: AC

## 2018-01-14 MED ORDER — LEVOTHYROXINE SODIUM 25 MCG PO TABS: 25.0000 ug | ORAL_TABLET | Freq: Every day | ORAL | 3 refills | Status: DC

## 2018-01-14 MED ORDER — VALACYCLOVIR HCL 1 G PO TABS: 1000.0000 mg | ORAL_TABLET | Freq: Two times a day (BID) | ORAL | 3 refills | Status: DC

## 2018-01-14 NOTE — Patient Instructions (Addendum)
We have sent in valtrex to use for flares 1 pill twice a day. If needed to prevent flares take 1 pill daily.   We have sent in the refills today and will check the labs.   Think about trying turmeric for the joints twice a day to help prevent progression of arthritis and help pain. Schedule with Dr. Tamala Julian for knee injection for the left knee.   Health Maintenance, Female Adopting a healthy lifestyle and getting preventive care can go a long way to promote health and wellness. Talk with your health care provider about what schedule of regular examinations is right for you. This is a good chance for you to check in with your provider about disease prevention and staying healthy. In between checkups, there are plenty of things you can do on your own. Experts have done a lot of research about which lifestyle changes and preventive measures are most likely to keep you healthy. Ask your health care provider for more information. Weight and diet Eat a healthy diet  Be sure to include plenty of vegetables, fruits, low-fat dairy products, and lean protein.  Do not eat a lot of foods high in solid fats, added sugars, or salt.  Get regular exercise. This is one of the most important things you can do for your health. ? Most adults should exercise for at least 150 minutes each week. The exercise should increase your heart rate and make you sweat (moderate-intensity exercise). ? Most adults should also do strengthening exercises at least twice a week. This is in addition to the moderate-intensity exercise.  Maintain a healthy weight  Body mass index (BMI) is a measurement that can be used to identify possible weight problems. It estimates body fat based on height and weight. Your health care provider can help determine your BMI and help you achieve or maintain a healthy weight.  For females 63 years of age and older: ? A BMI below 18.5 is considered underweight. ? A BMI of 18.5 to 24.9 is normal. ? A  BMI of 25 to 29.9 is considered overweight. ? A BMI of 30 and above is considered obese.  Watch levels of cholesterol and blood lipids  You should start having your blood tested for lipids and cholesterol at 43 years of age, then have this test every 5 years.  You may need to have your cholesterol levels checked more often if: ? Your lipid or cholesterol levels are high. ? You are older than 43 years of age. ? You are at high risk for heart disease.  Cancer screening Lung Cancer  Lung cancer screening is recommended for adults 54-60 years old who are at high risk for lung cancer because of a history of smoking.  A yearly low-dose CT scan of the lungs is recommended for people who: ? Currently smoke. ? Have quit within the past 15 years. ? Have at least a 30-pack-year history of smoking. A pack year is smoking an average of one pack of cigarettes a day for 1 year.  Yearly screening should continue until it has been 15 years since you quit.  Yearly screening should stop if you develop a health problem that would prevent you from having lung cancer treatment.  Breast Cancer  Practice breast self-awareness. This means understanding how your breasts normally appear and feel.  It also means doing regular breast self-exams. Let your health care provider know about any changes, no matter how small.  If you are in your 75s or  64s, you should have a clinical breast exam (CBE) by a health care provider every 1-3 years as part of a regular health exam.  If you are 80 or older, have a CBE every year. Also consider having a breast X-ray (mammogram) every year.  If you have a family history of breast cancer, talk to your health care provider about genetic screening.  If you are at high risk for breast cancer, talk to your health care provider about having an MRI and a mammogram every year.  Breast cancer gene (BRCA) assessment is recommended for women who have family members with  BRCA-related cancers. BRCA-related cancers include: ? Breast. ? Ovarian. ? Tubal. ? Peritoneal cancers.  Results of the assessment will determine the need for genetic counseling and BRCA1 and BRCA2 testing.  Cervical Cancer Your health care provider may recommend that you be screened regularly for cancer of the pelvic organs (ovaries, uterus, and vagina). This screening involves a pelvic examination, including checking for microscopic changes to the surface of your cervix (Pap test). You may be encouraged to have this screening done every 3 years, beginning at age 36.  For women ages 57-65, health care providers may recommend pelvic exams and Pap testing every 3 years, or they may recommend the Pap and pelvic exam, combined with testing for human papilloma virus (HPV), every 5 years. Some types of HPV increase your risk of cervical cancer. Testing for HPV may also be done on women of any age with unclear Pap test results.  Other health care providers may not recommend any screening for nonpregnant women who are considered low risk for pelvic cancer and who do not have symptoms. Ask your health care provider if a screening pelvic exam is right for you.  If you have had past treatment for cervical cancer or a condition that could lead to cancer, you need Pap tests and screening for cancer for at least 20 years after your treatment. If Pap tests have been discontinued, your risk factors (such as having a new sexual partner) need to be reassessed to determine if screening should resume. Some women have medical problems that increase the chance of getting cervical cancer. In these cases, your health care provider may recommend more frequent screening and Pap tests.  Colorectal Cancer  This type of cancer can be detected and often prevented.  Routine colorectal cancer screening usually begins at 43 years of age and continues through 43 years of age.  Your health care provider may recommend screening  at an earlier age if you have risk factors for colon cancer.  Your health care provider may also recommend using home test kits to check for hidden blood in the stool.  A small camera at the end of a tube can be used to examine your colon directly (sigmoidoscopy or colonoscopy). This is done to check for the earliest forms of colorectal cancer.  Routine screening usually begins at age 58.  Direct examination of the colon should be repeated every 5-10 years through 43 years of age. However, you may need to be screened more often if early forms of precancerous polyps or small growths are found.  Skin Cancer  Check your skin from head to toe regularly.  Tell your health care provider about any new moles or changes in moles, especially if there is a change in a mole's shape or color.  Also tell your health care provider if you have a mole that is larger than the size of a pencil  eraser.  Always use sunscreen. Apply sunscreen liberally and repeatedly throughout the day.  Protect yourself by wearing long sleeves, pants, a wide-brimmed hat, and sunglasses whenever you are outside.  Heart disease, diabetes, and high blood pressure  High blood pressure causes heart disease and increases the risk of stroke. High blood pressure is more likely to develop in: ? People who have blood pressure in the high end of the normal range (130-139/85-89 mm Hg). ? People who are overweight or obese. ? People who are African American.  If you are 44-43 years of age, have your blood pressure checked every 3-5 years. If you are 15 years of age or older, have your blood pressure checked every year. You should have your blood pressure measured twice-once when you are at a hospital or clinic, and once when you are not at a hospital or clinic. Record the average of the two measurements. To check your blood pressure when you are not at a hospital or clinic, you can use: ? An automated blood pressure machine at a  pharmacy. ? A home blood pressure monitor.  If you are between 57 years and 87 years old, ask your health care provider if you should take aspirin to prevent strokes.  Have regular diabetes screenings. This involves taking a blood sample to check your fasting blood sugar level. ? If you are at a normal weight and have a low risk for diabetes, have this test once every three years after 43 years of age. ? If you are overweight and have a high risk for diabetes, consider being tested at a younger age or more often. Preventing infection Hepatitis B  If you have a higher risk for hepatitis B, you should be screened for this virus. You are considered at high risk for hepatitis B if: ? You were born in a country where hepatitis B is common. Ask your health care provider which countries are considered high risk. ? Your parents were born in a high-risk country, and you have not been immunized against hepatitis B (hepatitis B vaccine). ? You have HIV or AIDS. ? You use needles to inject street drugs. ? You live with someone who has hepatitis B. ? You have had sex with someone who has hepatitis B. ? You get hemodialysis treatment. ? You take certain medicines for conditions, including cancer, organ transplantation, and autoimmune conditions.  Hepatitis C  Blood testing is recommended for: ? Everyone born from [birth year] through 1966. ? Anyone with known risk factors for hepatitis C.  Sexually transmitted infections (STIs)  You should be screened for sexually transmitted infections (STIs) including gonorrhea and chlamydia if: ? You are sexually active and are younger than 43 years of age. ? You are older than 43 years of age and your health care provider tells you that you are at risk for this type of infection. ? Your sexual activity has changed since you were last screened and you are at an increased risk for chlamydia or gonorrhea. Ask your health care provider if you are at risk.  If you do not  have HIV, but are at risk, it may be recommended that you take a prescription medicine daily to prevent HIV infection. This is called pre-exposure prophylaxis (PrEP). You are considered at risk if: ? You are sexually active and do not regularly use condoms or know the HIV status of your partner(s). ? You take drugs by injection. ? You are sexually active with a partner who has HIV.  Talk with your health care provider about whether you are at high risk of being infected with HIV. If you choose to begin PrEP, you should first be tested for HIV. You should then be tested every 3 months for as long as you are taking PrEP. Pregnancy  If you are premenopausal and you may become pregnant, ask your health care provider about preconception counseling.  If you may become pregnant, take 400 to 800 micrograms (mcg) of folic acid every day.  If you want to prevent pregnancy, talk to your health care provider about birth control (contraception). Osteoporosis and menopause  Osteoporosis is a disease in which the bones lose minerals and strength with aging. This can result in serious bone fractures. Your risk for osteoporosis can be identified using a bone density scan.  If you are 65 years of age or older, or if you are at risk for osteoporosis and fractures, ask your health care provider if you should be screened.  Ask your health care provider whether you should take a calcium or vitamin D supplement to lower your risk for osteoporosis.  Menopause may have certain physical symptoms and risks.  Hormone replacement therapy may reduce some of these symptoms and risks. Talk to your health care provider about whether hormone replacement therapy is right for you. Follow these instructions at home:  Schedule regular health, dental, and eye exams.  Stay current with your immunizations.  Do not use any tobacco products including cigarettes, chewing tobacco, or electronic cigarettes.  If you are pregnant,  do not drink alcohol.  If you are breastfeeding, limit how much and how often you drink alcohol.  Limit alcohol intake to no more than 1 drink per day for nonpregnant women. One drink equals 12 ounces of beer, 5 ounces of wine, or 1 ounces of hard liquor.  Do not use street drugs.  Do not share needles.  Ask your health care provider for help if you need support or information about quitting drugs.  Tell your health care provider if you often feel depressed.  Tell your health care provider if you have ever been abused or do not feel safe at home. This information is not intended to replace advice given to you by your health care provider. Make sure you discuss any questions you have with your health care provider. Document Released: 02/02/2011 Document Revised: 12/26/2015 Document Reviewed: 04/23/2015 Elsevier Interactive Patient Education  2018 Elsevier Inc.  

## 2018-01-14 NOTE — Progress Notes (Signed)
   Subjective:    Patient ID: Maria Wiley, female    DOB: 06/18/1976, 42 y.o.   MRN: [id number]  HPI The patient is a 42 YO female coming in new for physical. PMH reviewed and updated in A/P. No active new concerns. Needs follow up thyroid and lipids today.   PMH, [facility], social history reviewed and updated.   Review of Systems  Constitutional: Negative.   HENT: Negative.   Eyes: Negative.   Respiratory: Negative for cough, chest tightness and shortness of breath.   Cardiovascular: Negative for chest pain, palpitations and leg swelling.  Gastrointestinal: Negative for abdominal distention, abdominal pain, constipation, diarrhea, nausea and vomiting.  Musculoskeletal: Positive for arthralgias. Negative for back pain, gait problem, joint swelling and neck pain.  Skin: Negative.   Neurological: Negative.   Psychiatric/Behavioral: Negative.       Objective:   Physical Exam  Constitutional: She is oriented to person, place, and time. She appears well-developed and well-nourished.  HENT:  Head: Normocephalic and atraumatic.  Eyes: EOM are normal.  Neck: Normal range of motion.  Cardiovascular: Normal rate and regular rhythm.  Pulmonary/Chest: Effort normal and breath sounds normal. No respiratory distress. She has no wheezes. She has no rales.  Abdominal: Soft. Bowel sounds are normal. She exhibits no distension. There is no tenderness. There is no rebound.  Musculoskeletal: She exhibits no edema.  Neurological: She is alert and oriented to person, place, and time. Coordination normal.  Skin: Skin is warm and dry.  Psychiatric: She has a normal mood and affect.   Vitals:   01/14/18 1257  BP: 130/90  Pulse: 63  Temp: 98.5 F (36.9 C)  TempSrc: Oral  SpO2: 98%  Weight: 230 lb (104.3 kg)  Height: 5\' 7"  (1.702 m)      Assessment & Plan:

## 2018-01-15 DIAGNOSIS — M179 Osteoarthritis of knee, unspecified: Secondary | ICD-10-CM | POA: Insufficient documentation

## 2018-01-15 DIAGNOSIS — M1710 Unilateral primary osteoarthritis, unspecified knee: Secondary | ICD-10-CM | POA: Insufficient documentation

## 2018-01-15 DIAGNOSIS — Z0000 Encounter for general adult medical examination without abnormal findings: Secondary | ICD-10-CM | POA: Insufficient documentation

## 2018-01-15 DIAGNOSIS — K589 Irritable bowel syndrome without diarrhea: Secondary | ICD-10-CM | POA: Insufficient documentation

## 2018-01-15 DIAGNOSIS — M459 Ankylosing spondylitis of unspecified sites in spine: Secondary | ICD-10-CM | POA: Insufficient documentation

## 2018-01-15 NOTE — Assessment & Plan Note (Signed)
Pap smear up to date with gyn. Mammogram normal. Flu shot yearly. Pneumonia done recently. Cannot take live vaccines. Colon screening done in 2018 for ibs and not due again until 2028.

## 2018-01-15 NOTE — Assessment & Plan Note (Signed)
Referral to sports medicine for knee injections. She has had workup at rheum and OA seems to be the cause. 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 No acute injury or past injury to knee.

## 2018-01-15 NOTE — Assessment & Plan Note (Signed)
Referral to rheumatology local so she does not have to go to tertiary. Taking enbrel although their plan was to switch to humira. Also taking methotrexate (plan to wean off) and leflunamide. Gets monthly labs with [facility] CMP and CBC currently. Taking folic acid as well which is refilled today.  

## 2018-01-15 NOTE — Assessment & Plan Note (Signed)
Referral to rheumatology local so she does not have to go to tertiary. Taking enbrel although their plan was to switch to humira. Also taking methotrexate (plan to wean off) and leflunamide. Gets monthly labs with rheum CMP and CBC currently. Taking folic acid as well which is refilled today.

## 2018-01-15 NOTE — Assessment & Plan Note (Signed)
Taking synthroid 25 mcg daily. Checking TSH and free T4 and adjust as needed. No known cause to patient. No known hashimoto's.

## 2018-01-15 NOTE — Assessment & Plan Note (Signed)
More constipated and takes linzess when needed. Some diarrhea and pain symptoms. She does well overall with diet.

## 2018-01-20 ENCOUNTER — Encounter: Payer: Self-pay | Admitting: Family Medicine

## 2018-01-20 ENCOUNTER — Other Ambulatory Visit: Payer: Self-pay | Admitting: Family Medicine

## 2018-01-20 ENCOUNTER — Other Ambulatory Visit: Payer: No Typology Code available for payment source

## 2018-01-20 ENCOUNTER — Ambulatory Visit (INDEPENDENT_AMBULATORY_CARE_PROVIDER_SITE_OTHER): Payer: No Typology Code available for payment source | Admitting: Family Medicine

## 2018-01-20 VITALS — BP 142/78 | HR 78 | Temp 98.7°F | Ht 67.0 in | Wt 230.0 lb

## 2018-01-20 DIAGNOSIS — M175 Other unilateral secondary osteoarthritis of knee: Secondary | ICD-10-CM | POA: Diagnosis not present

## 2018-01-20 MED ORDER — DICLOFENAC SODIUM 2 % TD SOLN: 1.0000 "application " | Freq: Two times a day (BID) | TRANSDERMAL | 3 refills | Status: DC

## 2018-01-20 MED ORDER — FLUCONAZOLE 150 MG PO TABS: 150.0000 mg | ORAL_TABLET | Freq: Once | ORAL | 0 refills | Status: AC

## 2018-01-20 NOTE — Assessment & Plan Note (Signed)
Effusion and pain likely related to OA. Has a history of RA. No inciting event. 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 Cell count  - counseled on ice and compression  - counseled on HEP  - pennsaid  - if no improvement consider PT or gel injections.

## 2018-01-20 NOTE — Progress Notes (Signed)
Maria Wiley - 42 y.o. female MRN [id number]  Date of birth: 09-17-1975  SUBJECTIVE:  Including CC & ROS.  Chief Complaint  Patient presents with  . Left knee pain    Maria Wiley is a 42 y.o. female that is presenting with left knee pain. Ongoing for three months. Pain is located on the lateral aspect of her left knee. Pain is mild to severe during flexion. Admits to swelling. Pain is worse when she walks up stairs. Denies injury or surgeries. Denies trauma. She has been taking naproxen with no improvement. Denies any prior injury or surgery. Pain is localized to the knee. Has not received injections in the knee previously.   Review of the bilateral knee x-ray from 3/22 shows mild bilateral medial knee osteoarthritis.   Review of Systems  Constitutional: Negative for fever.  HENT: Negative for congestion.   Respiratory: Negative for cough.   Cardiovascular: Negative for chest pain.  Gastrointestinal: Negative for abdominal pain.  Musculoskeletal: Positive for joint swelling.  Skin: Negative for color change.  Neurological: Negative for weakness.  Hematological: Negative for adenopathy.  Psychiatric/Behavioral: Negative for agitation.    HISTORY: Past Medical, Surgical, Social, and Family History Reviewed & Updated per EMR.   Pertinent Historical Findings include:  Past Medical History:  Diagnosis Date  . Chest pain   . GERD (gastroesophageal reflux disease)   . Migraines   . RA (rheumatoid arthritis) (HCC)   . Thyroid disease   . Urinary incontinence     Past Surgical History:  Procedure Laterality Date  . ABDOMINAL HYSTERECTOMY    . APPENDECTOMY    . CHOLECYSTECTOMY    . HERNIA REPAIR  2004  . KNEE SURGERY Right 2006    No Known Allergies  Family History  Problem Relation Age of Onset  . CAD Maternal Grandfather   . Diabetes Maternal Grandfather   . Hearing loss Maternal Grandfather   . Heart disease Maternal Grandfather   . Hyperlipidemia Maternal  Grandfather   . CAD Paternal Grandfather   . Hypertension Mother   . Arthritis Mother   . Arthritis Maternal Grandmother   . Heart disease Maternal Grandmother   . Hyperlipidemia Maternal Grandmother      Social History   Socioeconomic History  . Marital status: Married    Spouse name: Not on file  . Number of children: Not on file  . Years of education: Not on file  . Highest education level: Not on file  Occupational History  . Not on file  Social Needs  . Financial resource strain: Not on file  . Food insecurity:    Worry: Not on file    Inability: Not on file  . Transportation needs:    Medical: Not on file    Non-medical: Not on file  Tobacco Use  . Smoking status: Never Smoker  . Smokeless tobacco: Never Used  Substance and Sexual Activity  . Alcohol use: Never    Frequency: Never  . Drug use: Never  . Sexual activity: Yes  Lifestyle  . Physical activity:    Days per week: Not on file    Minutes per session: Not on file  . Stress: Not on file  Relationships  . Social connections:    Talks on phone: Not on file    Gets together: Not on file    Attends religious service: Not on file    Active member of club or organization: Not on file    Attends meetings of clubs or  organizations: Not on file    Relationship status: Not on file  . Intimate partner violence:    Fear of current or ex partner: Not on file    Emotionally abused: Not on file    Physically abused: Not on file    Forced sexual activity: Not on file  Other Topics Concern  . Not on file  Social History Narrative  . Not on file     PHYSICAL EXAM:  VS: BP (!) 142/78 (BP Location: Left Arm, Patient Position: Sitting, Cuff Size: Normal)   Pulse 78   Temp 98.7 F (37.1 C) (Oral)   Ht 5\' 7"  (1.702 m)   Wt 230 lb (104.3 kg)   SpO2 96%   BMI 36.02 kg/m  Physical Exam Gen: NAD, alert, cooperative with exam, well-appearing ENT: normal lips, normal nasal mucosa,  Eye: normal EOM, normal  conjunctiva and lids CV:  no edema, +2 pedal pulses   Resp: no accessory muscle use, non-labored,  Skin: no rashes, no areas of induration  Neuro: normal tone, normal sensation to touch Psych:  normal insight, alert and oriented MSK:  Left Knee: Normal to inspection with no erythema or obvious bony abnormalities. TTP along the medial aspect of the patella and lateral joint line  Mild effusion  ROM full in flexion and extension and lower leg rotation. Ligaments with solid consistent endpoints including  LCL, MCL. Negative Mcmurray's  tests. Mild pain with patellar compression. Patellar and quadriceps tendons unremarkable. Hamstring and quadriceps strength is normal.  Neurovascularly intact   Limited ultrasound: left knee:  Moderate effusion in the suprapatellar pouch. Normal-appearing quadricep and patellar tendon. Medial joint space narrowing  Lateral joint space narrowing   Summary: effusion and degenerative changes   Ultrasound and interpretation by , MD          Aspiration/Injection Procedure Note Clare Gandy 1976-02-16  Procedure: Aspiration and Injection Indications: left knee pain   Procedure Details Consent: Risks of procedure as well as the alternatives and risks of each were explained to the (patient/caregiver).  Consent for procedure obtained. Time Out: Verified patient identification, verified procedure, site/side was marked, verified correct patient position, special equipment/implants available, medications/allergies/relevent history reviewed, required imaging and test results available.  Performed.  The area was cleaned with iodine and alcohol swabs.    The left superior lateral suprapatellar pouch was injected and aspirated.  3 cc of 1% lidocaine without epinephrine was used to anesthetize the tract in the skin.  An 18-gauge needle was then inserted and aspiration occurred.  This was done under ultrasound guidance.  The syringe was  exchanged and a mixture of 1 cc of 40 mg Depomedrol and 4 cc's of 0.5% bupivicaine.  Ultrasound was used. Images were obtained in Long views showing the injection.    Amount of Fluid Aspirated: 76mL Character of Fluid: clear, straw colored and gelatinous Fluid was sent 38m count and crystal identification A sterile dressing was applied.  Patient did tolerate procedure well.       ASSESSMENT & PLAN:   OA (osteoarthritis) of knee Effusion and pain likely related to OA. Has a history of RA. No inciting event. 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 Cell count  - counseled on ice and compression  - counseled on HEP  - pennsaid  - if no improvement consider PT or gel injections.

## 2018-01-20 NOTE — Addendum Note (Signed)
Addended by: Myra Rude on: 01/20/2018 09:24 AM   Modules accepted: Orders

## 2018-01-20 NOTE — Patient Instructions (Signed)
Nice to meet you  Please try the pennsaid for pain  Please try to ice your knee every so often  Please try compression if it swells  Please try the exercises.  Please follow up with me in 4 weeks.

## 2018-01-21 LAB — TIQ-NTM

## 2018-01-21 LAB — SYNOVIAL CELL COUNT + DIFF, W/ CRYSTALS
Basophils, %: 0 %
Eosinophils-Synovial: 0 % (ref 0–2)
Lymphocytes-Synovial Fld: 42 % (ref 0–74)
Monocyte/Macrophage: 44 % (ref 0–69)
Neutrophil, Synovial: 8 % (ref 0–24)
Synoviocytes, %: 6 % (ref 0–15)
WBC, Synovial: 134 cells/uL (ref <150)

## 2018-02-08 ENCOUNTER — Ambulatory Visit (INDEPENDENT_AMBULATORY_CARE_PROVIDER_SITE_OTHER): Payer: Self-pay | Admitting: Nurse Practitioner

## 2018-02-08 VITALS — BP 125/80 | HR 72 | Temp 98.3°F | Resp 16

## 2018-02-08 DIAGNOSIS — H6981 Other specified disorders of Eustachian tube, right ear: Secondary | ICD-10-CM

## 2018-02-08 MED ORDER — FLUTICASONE PROPIONATE 50 MCG/ACT NA SUSP: 2.0000 | Freq: Every day | NASAL | 0 refills | Status: DC

## 2018-02-08 NOTE — Patient Instructions (Signed)
Eustachian Tube Dysfunction The eustachian tube connects the middle ear to the back of the nose. It regulates air pressure in the middle ear by allowing air to move between the ear and nose. It also helps to drain fluid from the middle ear space. When the eustachian tube does not function properly, air pressure, fluid, or both can build up in the middle ear. Eustachian tube dysfunction can affect one or both ears. What are the causes? This condition happens when the eustachian tube becomes blocked or cannot open normally. This may result from:  Ear infections.  Colds and other upper respiratory infections.  Allergies.  Irritation, such as from cigarette smoke or acid from the stomach coming up into the esophagus (gastroesophageal reflux).  Sudden changes in air pressure, such as from descending in an airplane.  Abnormal growths in the nose or throat, such as nasal polyps, tumors, or enlarged tissue at the back of the throat (adenoids).  What increases the risk? This condition may be more likely to develop in people who smoke and people who are overweight. Eustachian tube dysfunction may also be more likely to develop in children, especially children who have:  Certain birth defects of the mouth, such as cleft palate.  Large tonsils and adenoids.  What are the signs or symptoms? Symptoms of this condition may include:  A feeling of fullness in the ear.  Ear pain.  Clicking or popping noises in the ear.  Ringing in the ear.  Hearing loss.  Loss of balance.  Symptoms may get worse when the air pressure around you changes, such as when you travel to an area of high elevation or fly on an airplane. How is this diagnosed? This condition may be diagnosed based on:  Your symptoms.  A physical exam of your ear, nose, and throat.  Tests, such as those that measure: ? The movement of your eardrum (tympanogram). ? Your hearing (audiometry).  How is this treated? Treatment  depends on the cause and severity of your condition. If your symptoms are mild, you may be able to relieve your symptoms by moving air into ("popping") your ears. If you have symptoms of fluid in your ears, treatment may include:  Decongestants.  Antihistamines.  Nasal sprays or ear drops that contain medicines that reduce swelling (steroids).  In some cases, you may need to have a procedure to drain the fluid in your eardrum (myringotomy). In this procedure, a small tube is placed in the eardrum to:  Drain the fluid.  Restore the air in the middle ear space.  Follow these instructions at home:  Take over-the-counter and prescription medicines only as told by your health care provider.  Use techniques to help pop your ears as recommended by your health care provider. These may include: ? Chewing gum. ? Yawning. ? Frequent, forceful swallowing. ? Closing your mouth, holding your nose closed, and gently blowing as if you are trying to blow air out of your nose.  Do not do any of the following until your health care provider approves: ? Travel to high altitudes. ? Fly in airplanes. ? Work in a pressurized cabin or room. ? Scuba dive.  Keep your ears dry. Dry your ears completely after showering or bathing.  Do not smoke.  Keep all follow-up visits as told by your health care provider. This is important. Contact a health care provider if:  Your symptoms do not go away after treatment.  Your symptoms come back after treatment.  You are   unable to pop your ears.  You have: ? A fever. ? Pain in your ear. ? Pain in your head or neck. ? Fluid draining from your ear.  Your hearing suddenly changes.  You become very dizzy.  You lose your balance. This information is not intended to replace advice given to you by your health care provider. Make sure you discuss any questions you have with your health care provider. Document Released: 08/16/2015 Document Revised: 12/26/2015  Document Reviewed: 08/08/2014 Elsevier Interactive Patient Education  2018 [employer]  

## 2018-02-08 NOTE — Progress Notes (Signed)
   Subjective:    Patient ID: Maria Wiley, female    DOB: 1976/06/12, 42 y.o.   MRN: [id number]  She is a 42 year old female who presents today for complaints of right-sided ear pain.  The patient states the pain started this morning.  Patient states it feels like her ear has to "pop ".  Patient denies any fever, chills, coughing, nasal congestion, runny nose, sore throat, or ear drainage.  The patient is concerned this may be an ear infection.  Patient has not taken any medications for her symptoms at this time.  Sore Throat   This is a new problem. The current episode started today. The problem has been unchanged. The pain is worse on the right side. There has been no fever. The fever has been present for less than 1 day. The pain is at a severity of 3/10. Associated symptoms include ear pain. Pertinent negatives include no congestion, coughing, ear discharge, headaches, swollen glands or trouble swallowing. She has tried nothing for the symptoms.  Otalgia   Pertinent negatives include no coughing, ear discharge or headaches.      Review of Systems  HENT: Positive for ear pain. Negative for congestion, ear discharge and trouble swallowing.   Respiratory: Negative for cough.   Neurological: Negative for headaches.       Objective:   Physical Exam  Constitutional: She appears well-developed and well-nourished. She does not appear ill.  HENT:  Head: Normocephalic and atraumatic.  Right Ear: Hearing and ear canal normal. No drainage or swelling. A middle ear effusion is present.  Left Ear: Hearing, tympanic membrane and ear canal normal. No drainage, swelling or tenderness.  Mouth/Throat: Uvula is midline, oropharynx is clear and moist and mucous membranes are normal. No oropharyngeal exudate, posterior oropharyngeal edema or posterior oropharyngeal erythema.  Eyes: Pupils are equal, round, and reactive to light. EOM are normal.  Neck: Normal range of motion. Neck supple.   Cardiovascular: Normal rate, regular rhythm and normal heart sounds.  Pulmonary/Chest: Effort normal and breath sounds normal.  Abdominal: Soft. Bowel sounds are normal.  Neurological: She is alert.  Skin: Skin is warm and dry.      Assessment & Plan:  Exam findings, diagnosis etiology and medication use and indications reviewed with patient. Follow- Up and discharge instructions provided. No emergent/urgent issues found on exam.  Patient verbalized understanding of information provided and agrees with plan of care (POC), all questions answered.   1. Acute dysfunction of right eustachian tube - fluticasone (FLONASE) 50 MCG/ACT nasal spray; Place 2 sprays into both nostrils daily for 10 days.  Dispense: 16 g; Refill: 0 -Follow up in 5-7 days if symptoms do not improve.

## 2018-02-18 ENCOUNTER — Ambulatory Visit: Payer: No Typology Code available for payment source | Admitting: Family Medicine

## 2018-02-25 ENCOUNTER — Ambulatory Visit (INDEPENDENT_AMBULATORY_CARE_PROVIDER_SITE_OTHER): Payer: No Typology Code available for payment source | Admitting: Internal Medicine

## 2018-02-25 ENCOUNTER — Encounter: Payer: Self-pay | Admitting: Internal Medicine

## 2018-02-25 ENCOUNTER — Other Ambulatory Visit (INDEPENDENT_AMBULATORY_CARE_PROVIDER_SITE_OTHER): Payer: No Typology Code available for payment source

## 2018-02-25 VITALS — BP 118/70 | HR 83 | Temp 98.4°F | Ht 67.0 in | Wt 231.0 lb

## 2018-02-25 DIAGNOSIS — L659 Nonscarring hair loss, unspecified: Secondary | ICD-10-CM

## 2018-02-25 DIAGNOSIS — Z23 Encounter for immunization: Secondary | ICD-10-CM | POA: Diagnosis not present

## 2018-02-25 LAB — VITAMIN B12: Vitamin B-12: 297 pg/mL (ref 211–911)

## 2018-02-25 LAB — FERRITIN: Ferritin: 64.1 ng/mL (ref 10.0–291.0)

## 2018-02-25 LAB — VITAMIN D 25 HYDROXY (VIT D DEFICIENCY, FRACTURES): VITD: 23.63 ng/mL — ABNORMAL LOW (ref 30.00–100.00)

## 2018-02-25 NOTE — Patient Instructions (Signed)
Try biotin over the counter for hair and nail strength.   We are checking some vitamin levels to make sure that this is not causing any problems.  Try some hard candy in your mouth to help with the throat.

## 2018-02-25 NOTE — Progress Notes (Signed)
   Subjective:    Patient ID: Maria Wiley, female    DOB: 06/18/1976, 43 y.o.   MRN: [id number]  HPI The patient is a 42 YO female coming in for problems with her hair. She is having some hair loss around the edges. This is worrisome to her. She is starting to have some hair regrowth. She denies any new stresses, medication changes. Denies trauma. Does use a ponytail but this is not new. Denies rash or other symptoms.   Review of Systems  Constitutional: Negative.   HENT: Negative.        Hair loss  Eyes: Negative.   Respiratory: Negative for cough, chest tightness and shortness of breath.   Cardiovascular: Negative for chest pain, palpitations and leg swelling.  Gastrointestinal: Negative for abdominal distention, abdominal pain, constipation, diarrhea, nausea and vomiting.  Musculoskeletal: Negative.   Skin: Negative.   Neurological: Negative.   Psychiatric/Behavioral: Negative.       Objective:   Physical Exam  Constitutional: She is oriented to person, place, and time. She appears well-developed and well-nourished.  HENT:  Head: Normocephalic and atraumatic.  The edge of the hairline has some hair loss, some regrowth. No rash on the skin.   Eyes: EOM are normal.  Neck: Normal range of motion.  Cardiovascular: Normal rate and regular rhythm.  Pulmonary/Chest: Effort normal and breath sounds normal. No respiratory distress. She has no wheezes. She has no rales.  Abdominal: Soft.  Musculoskeletal: She exhibits no edema.  Neurological: She is alert and oriented to person, place, and time. Coordination normal.  Skin: Skin is warm and dry.   Vitals:   02/25/18 0803  BP: 118/70  Pulse: 83  Temp: 98.4 F (36.9 C)  TempSrc: Oral  SpO2: 96%  Weight: 231 lb (104.8 kg)  Height: 5\' 7"  (1.702 m)      Assessment & Plan:  Tdap given at visit

## 2018-02-25 NOTE — Assessment & Plan Note (Signed)
Checking vitamin D, B12, ferritin. Recent thyroid function normal and CBC and CMP normal. Reassurance given. Advised biotin over the counter.

## 2018-03-05 ENCOUNTER — Other Ambulatory Visit: Payer: Self-pay | Admitting: Nurse Practitioner

## 2018-03-05 DIAGNOSIS — H6981 Other specified disorders of Eustachian tube, right ear: Secondary | ICD-10-CM

## 2018-04-22 ENCOUNTER — Ambulatory Visit: Payer: Self-pay

## 2018-04-22 ENCOUNTER — Ambulatory Visit (INDEPENDENT_AMBULATORY_CARE_PROVIDER_SITE_OTHER): Payer: Self-pay | Admitting: Family Medicine

## 2018-04-22 VITALS — BP 140/90 | HR 73 | Temp 98.3°F | Wt 230.2 lb

## 2018-04-22 DIAGNOSIS — J0100 Acute maxillary sinusitis, unspecified: Secondary | ICD-10-CM

## 2018-04-22 MED ORDER — IPRATROPIUM BROMIDE 0.06 % NA SOLN: 2.0000 | Freq: Three times a day (TID) | NASAL | 0 refills | Status: DC

## 2018-04-22 MED ORDER — PSEUDOEPH-BROMPHEN-DM 30-2-10 MG/5ML PO SYRP: 10.0000 mL | ORAL_SOLUTION | Freq: Three times a day (TID) | ORAL | 0 refills | Status: DC | PRN

## 2018-04-22 MED ORDER — FLUTICASONE PROPIONATE 50 MCG/ACT NA SUSP: 2.0000 | Freq: Every day | NASAL | 0 refills | Status: DC
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
## 2018-04-22 NOTE — Patient Instructions (Signed)

## 2018-05-09 ENCOUNTER — Ambulatory Visit (INDEPENDENT_AMBULATORY_CARE_PROVIDER_SITE_OTHER): Payer: No Typology Code available for payment source | Admitting: Internal Medicine

## 2018-05-09 ENCOUNTER — Encounter: Payer: Self-pay | Admitting: Internal Medicine

## 2018-05-09 VITALS — BP 136/86 | HR 72 | Temp 98.1°F | Ht 67.0 in | Wt 234.0 lb

## 2018-05-09 DIAGNOSIS — L989 Disorder of the skin and subcutaneous tissue, unspecified: Secondary | ICD-10-CM

## 2018-05-09 DIAGNOSIS — Z1239 Encounter for other screening for malignant neoplasm of breast: Secondary | ICD-10-CM | POA: Diagnosis not present

## 2018-05-09 DIAGNOSIS — E039 Hypothyroidism, unspecified: Secondary | ICD-10-CM

## 2018-05-09 DIAGNOSIS — J0110 Acute frontal sinusitis, unspecified: Secondary | ICD-10-CM | POA: Diagnosis not present

## 2018-05-09 MED ORDER — AMOXICILLIN-POT CLAVULANATE 875-125 MG PO TABS: 1.0000 | ORAL_TABLET | Freq: Two times a day (BID) | ORAL | 0 refills | Status: DC

## 2018-05-09 MED ORDER — LEVOTHYROXINE SODIUM 25 MCG PO TABS: 25.0000 ug | ORAL_TABLET | Freq: Every day | ORAL | 3 refills | Status: DC

## 2018-05-09 MED ORDER — VESICARE 10 MG PO TABS: 10.0000 mg | ORAL_TABLET | Freq: Every day | ORAL | 3 refills | Status: DC

## 2018-05-09 NOTE — Progress Notes (Signed)
   Subjective:    Patient ID: Maria Wiley, female    DOB: Nov 29, 1975, 42 y.o.   MRN: [id number]  HPI The patient is a 42 YO female coming in for sinus problems. Started with running nose and drainage. She is having sinus pressure. Had been taking flonase and zyrtec. She then started getting nosebleeds. Lasted less than 5 minutes. Dripping blood. Able to stop. Since that time she has gotten a large scab in the area of the bleed. Right nostril. No picking and trying not to agitate the area. No recurrent bleeding since that time. Overall sinus symptoms started 2 weeks or so ago. Some headaches. Mild sore throat. Some cough. No SOB. She has not gotten relief from zyrtec. Stopped flonase with the nosebleeds. Denies fevers or chills. Overall symptoms are worsening.   Review of Systems  Constitutional: Positive for activity change, appetite change and chills. Negative for fatigue, fever and unexpected weight change.  HENT: Positive for congestion, nosebleeds, postnasal drip, rhinorrhea and sinus pressure. Negative for ear discharge, ear pain, sinus pain, sneezing, sore throat, tinnitus, trouble swallowing and voice change.   Eyes: Negative.   Respiratory: Positive for cough. Negative for chest tightness, shortness of breath and wheezing.   Cardiovascular: Negative.   Gastrointestinal: Negative.   Musculoskeletal: Positive for myalgias.  Neurological: Negative.       Objective:   Physical Exam  Constitutional: She is oriented to person, place, and time. She appears well-developed and well-nourished.  HENT:  Head: Normocephalic and atraumatic.  Oropharynx with redness and clear drainage, nose with swollen turbinates and 1 cm scab at the frontal right nostril, TMs bulging clear fluid bilaterally, frontal sinus pressure  Eyes: EOM are normal.  Neck: Normal range of motion. No thyromegaly present.  Cardiovascular: Normal rate and regular rhythm.  Pulmonary/Chest: Effort normal and breath sounds  normal. No respiratory distress. She has no wheezes. She has no rales.  Abdominal: Soft. Bowel sounds are normal. She exhibits no distension. There is no tenderness. There is no rebound.  Musculoskeletal: She exhibits tenderness. She exhibits no edema.  Lymphadenopathy:    She has no cervical adenopathy.  Neurological: She is alert and oriented to person, place, and time. Coordination normal.  Skin: Skin is warm and dry.  Psychiatric: She has a normal mood and affect.   Vitals:   05/09/18 1311  BP: 136/86  Pulse: 72  Temp: 98.1 F (36.7 C)  TempSrc: Oral  SpO2: 98%  Weight: 234 lb (106.1 kg)  Height: 5\' 7"  (1.702 m)      Assessment & Plan:

## 2018-05-09 NOTE — Patient Instructions (Signed)
We will get you in with the dermatologist.   We have sent in augmentin to take 1 pill twice a day for 10 days.

## 2018-05-12 ENCOUNTER — Telehealth: Payer: Self-pay

## 2018-05-12 DIAGNOSIS — J019 Acute sinusitis, unspecified: Secondary | ICD-10-CM | POA: Insufficient documentation

## 2018-05-12 NOTE — Telephone Encounter (Signed)
PA started on CoverMyMeds KEY: [id number]

## 2018-05-12 NOTE — Assessment & Plan Note (Signed)
Rx for augmentin for the infection. Stay away from flonase for awhile given nosebleeds. Avoid picking or blowing nose. Continue zyrtec.

## 2018-05-17 ENCOUNTER — Telehealth: Payer: Self-pay | Admitting: Internal Medicine

## 2018-05-17 NOTE — Telephone Encounter (Signed)
Copied from CRM [id number]. Topic: General - Other >> May 17, 2018  5:08 PM Stephannie Li, NT wrote: Reason for CRM: [facility]  called and need to know if the patient has tried other medications besides  VESICARE 10 MG tablet  [phone]

## 2018-05-18 NOTE — Telephone Encounter (Signed)
Received a fax today will fill out this afternoon and fax back

## 2018-05-25 NOTE — Telephone Encounter (Signed)
PA for vesicare denied because patient has to try oxybutynin first

## 2018-05-26 NOTE — Telephone Encounter (Signed)
Can you call patient and find out if she has ever taken anything else for bladder? She has been on vesicare for some time. If not ask her if she is willing to switch to oxybutynin.

## 2018-05-26 NOTE — Telephone Encounter (Signed)
Will try an appeal on medication. States has tried oxybutynin from her urologist in the past and it was ineffective

## 2018-05-31 ENCOUNTER — Other Ambulatory Visit: Payer: Self-pay | Admitting: Internal Medicine

## 2018-05-31 DIAGNOSIS — N63 Unspecified lump in unspecified breast: Secondary | ICD-10-CM

## 2018-05-31 DIAGNOSIS — N6019 Diffuse cystic mastopathy of unspecified breast: Secondary | ICD-10-CM

## 2018-06-02 ENCOUNTER — Other Ambulatory Visit: Payer: No Typology Code available for payment source

## 2018-06-02 ENCOUNTER — Ambulatory Visit (INDEPENDENT_AMBULATORY_CARE_PROVIDER_SITE_OTHER): Payer: No Typology Code available for payment source | Admitting: Family Medicine

## 2018-06-02 ENCOUNTER — Other Ambulatory Visit: Payer: Self-pay | Admitting: Family Medicine

## 2018-06-02 ENCOUNTER — Encounter: Payer: Self-pay | Admitting: Family Medicine

## 2018-06-02 VITALS — BP 132/86 | HR 63 | Temp 98.4°F | Ht 67.0 in | Wt 228.0 lb

## 2018-06-02 DIAGNOSIS — N3090 Cystitis, unspecified without hematuria: Secondary | ICD-10-CM

## 2018-06-02 DIAGNOSIS — M549 Dorsalgia, unspecified: Secondary | ICD-10-CM

## 2018-06-02 LAB — POCT URINALYSIS DIPSTICK
Bilirubin, UA: NEGATIVE
Blood, UA: NEGATIVE
Glucose, UA: NEGATIVE
Ketones, UA: NEGATIVE
Nitrite, UA: NEGATIVE
Protein, UA: NEGATIVE
Spec Grav, UA: >=1.03 — AB (ref 1.010–1.025)
Urobilinogen, UA: NEGATIVE E.U./dL — AB
pH, UA: 6 (ref 5.0–8.0)

## 2018-06-02 MED ORDER — NITROFURANTOIN MONOHYD MACRO 100 MG PO CAPS: 100.0000 mg | ORAL_CAPSULE | Freq: Two times a day (BID) | ORAL | 0 refills | Status: DC

## 2018-06-02 MED ORDER — CIPROFLOXACIN HCL 500 MG PO TABS: 500.0000 mg | ORAL_TABLET | Freq: Two times a day (BID) | ORAL | 0 refills | Status: DC

## 2018-06-02 NOTE — Progress Notes (Signed)
Subjective:    Patient ID: Maria Wiley, female    DOB: [birth year], 42 y.o.   MRN: [id number]  HPI  BACK PAIN  Location: Lower Back worse on Left than right side  Quality: Aching 7-8 Onset: One day ago Worse with: Movement   Better with: Position and Motrin have provided limited benefit    Radiation: No Trauma: No Best sitting/standing/leaning forward: Yes  Red Flags Fecal/urinary incontinence: No Numbness/Weakness: No Fever/chills/sweats: No Night pain:  No Unexplained weight loss: No No relief with bedrest:  No h/o cancer/immunosuppression:  No IV drug use:  No PMH of osteoporosis or chronic steroid use: No History of RA and Ankylosing spondylitis present  Review of Systems  Constitutional: Negative for chills, fatigue and fever.  Respiratory: Negative for cough, shortness of breath and wheezing.   Cardiovascular: Negative for chest pain and palpitations.  Gastrointestinal: Negative for abdominal pain.  Genitourinary: Negative for dysuria, flank pain, frequency and urgency.  Musculoskeletal: Positive for back pain.  Skin: Negative for rash.  Neurological: Negative for dizziness, weakness, light-headedness and headaches.   Past Medical History:  Diagnosis Date  . Chest pain   . GERD (gastroesophageal reflux disease)   . Migraines   . RA (rheumatoid arthritis) (HCC)   . Thyroid disease   . Urinary incontinence      Social History   Socioeconomic History  . Marital status: Married    Spouse name: Not on file  . Number of children: Not on file  . Years of education: Not on file  . Highest education level: Not on file  Occupational History  . Not on file  Social Needs  . Financial resource strain: Not on file  . Food insecurity:    Worry: Not on file    Inability: Not on file  . Transportation needs:    Medical: Not on file    Non-medical: Not on file  Tobacco Use  . Smoking status: Never Smoker  . Smokeless tobacco: Never Used  Substance and Sexual  Activity  . Alcohol use: Never    Frequency: Never  . Drug use: Never  . Sexual activity: Yes  Lifestyle  . Physical activity:    Days per week: Not on file    Minutes per session: Not on file  . Stress: Not on file  Relationships  . Social connections:    Talks on phone: Not on file    Gets together: Not on file    Attends religious service: Not on file    Active member of club or organization: Not on file    Attends meetings of clubs or organizations: Not on file    Relationship status: Not on file  . Intimate partner violence:    Fear of current or ex partner: Not on file    Emotionally abused: Not on file    Physically abused: Not on file    Forced sexual activity: Not on file  Other Topics Concern  . Not on file  Social History Narrative  . Not on file    Past Surgical History:  Procedure Laterality Date  . ABDOMINAL HYSTERECTOMY    . APPENDECTOMY    . CHOLECYSTECTOMY    . HERNIA REPAIR  2004  . KNEE SURGERY Right 2006    Family History  Problem Relation Age of Onset  . CAD Maternal Grandfather   . Diabetes Maternal Grandfather   . Hearing loss Maternal Grandfather   . Heart disease Maternal Grandfather   . Hyperlipidemia  Maternal Grandfather   . CAD Paternal Grandfather   . Hypertension Mother   . Arthritis Mother   . Arthritis Maternal Grandmother   . Heart disease Maternal Grandmother   . Hyperlipidemia Maternal Grandmother     No Known Allergies  Current Outpatient Medications on File Prior to Visit  Medication Sig Dispense Refill  . amoxicillin-clavulanate (AUGMENTIN) 875-125 MG tablet Take 1 tablet by mouth 2 (two) times daily. 20 tablet 0  . cholecalciferol (VITAMIN D) 1000 units tablet Take 1,000 Units by mouth daily.    . Diclofenac Sodium (PENNSAID) 2 % SOLN Place 1 application onto the skin 2 (two) times daily. 1 Bottle 3  . escitalopram (LEXAPRO) 20 MG tablet Take 10 mg by mouth daily.    Marland Kitchen etanercept (ENBREL) 50 MG/ML injection Inject 50  mg into the skin once a week.    . fluticasone (FLONASE) 50 MCG/ACT nasal spray Place 2 sprays into both nostrils daily. 16 g 0  . folic acid (FOLVITE) 1 MG tablet Take 1 tablet (1 mg total) by mouth daily. 90 tablet 3  . ipratropium (ATROVENT) 0.06 % nasal spray Place 2 sprays into both nostrils 3 (three) times daily. 15 mL 0  . leflunomide (ARAVA) 20 MG tablet Take 20 mg by mouth daily.    Marland Kitchen levothyroxine (SYNTHROID, LEVOTHROID) 25 MCG tablet Take 1 tablet (25 mcg total) by mouth daily before breakfast. 90 tablet 3  . LINZESS 145 MCG CAPS capsule Take 145 mcg by mouth daily.    . Magnesium 250 MG TABS Take 250 mg by mouth daily.    . valACYclovir (VALTREX) 1000 MG tablet Take 1 tablet (1,000 mg total) by mouth 2 (two) times daily. 60 tablet 3  . VESICARE 10 MG tablet Take 1 tablet (10 mg total) by mouth daily. 90 tablet 3  . fluticasone (FLONASE) 50 MCG/ACT nasal spray Place 2 sprays into both nostrils daily for 10 days. 16 g 0   No current facility-administered medications on file prior to visit.     BP (!) 140/96   Pulse 63   Temp 98.4 F (36.9 C) (Oral)   Ht 5\' 7"  (1.702 m)   Wt 228 lb (103.4 kg)   SpO2 99%   BMI 35.71 kg/m  Retake of BP:  132/86       Objective:   Physical Exam  Constitutional: She is oriented to person, place, and time. She appears well-developed and well-nourished.  Eyes: Pupils are equal, round, and reactive to light.  Neck: Neck supple.  Cardiovascular: Normal rate, regular rhythm and intact distal pulses.  Pulmonary/Chest: Effort normal and breath sounds normal. She has no wheezes. She has no rales.  Abdominal: Soft. Bowel sounds are normal. There is no tenderness. There is no CVA tenderness.  Musculoskeletal:  Spine with normal alignment and no deformity. No tenderness to vertebral process with palpation with the exception of mild tenderness around  paraspinous muscles left side. She notes this area as painful with movement.  ROM is full at lumbar  sacral regions. Negative Straight Leg raise.  No CVA tenderness present.  Lymphadenopathy:    She has no cervical adenopathy.  Neurological: She is alert and oriented to person, place, and time.  Skin: Skin is warm and dry. Capillary refill takes less than 2 seconds. No rash noted.  Psychiatric: She has a normal mood and affect. Her behavior is normal. Judgment and thought content normal.      Assessment & Plan:  1. Back pain, unspecified  back location, unspecified back pain laterality, unspecified chronicity Exam is most consistent with musculoskeletal pain. No red flags present today. Opted to obtain a UA to check for any signs of UTI. Advised use of mild stretching and short course of ibuprofen for symptom relief. Return precautions provided.   - POCT Urinalysis Dipstick - Urine Culture; Future  2. Cystitis UA indicated 3+ Leukocytes, negative for blood and protein today. UA is suggestive of UTI. Will send for culture and initiate empiric therapy. No CVA tenderness present today and VSS which may pyelonephritis unlikely.   Advised patient to complete antibiotic and she should follow up if her symptoms do not improve in 2 to 3 days, worsen, she develops a fever >101, or back pain. Also advised her to force fluids and she can use OTC Azo for symptom relief temporarily if needed.  Finally, we reviewed reasons to return to care including if symptoms worsen or persist or new concerns arise- once again particularly fever, N/V, or flank pain.  - nitrofurantoin, macrocrystal-monohydrate, (MACROBID) 100 MG capsule; Take 1 capsule (100 mg total) by mouth 2 (two) times daily.  Dispense: 10 capsule; Refill: 0  She voiced understanding and agreed with plan.  Roddie Mc, FNP-C

## 2018-06-02 NOTE — Patient Instructions (Addendum)
Please complete antibiotic as directed. If you develop symptoms of fever >101, pain in your back, or do not improve with treatment that has been provided in 3 to 4 days, please follow up for further evaluation and treatment.    Urinary Tract Infection, Adult A urinary tract infection (UTI) is an infection of any part of the urinary tract. The urinary tract includes the:  Kidneys.  Ureters.  Bladder.  Urethra.  These organs make, store, and get rid of pee (urine) in the body. Follow these instructions at home:  Take over-the-counter and prescription medicines only as told by your doctor.  If you were prescribed an antibiotic medicine, take it as told by your doctor. Do not stop taking the antibiotic even if you start to feel better.  Avoid the following drinks: ? Alcohol. ? Caffeine. ? Tea. ? Carbonated drinks.  Drink enough fluid to keep your pee clear or pale yellow.  Keep all follow-up visits as told by your doctor. This is important.  Make sure to: ? Empty your bladder often and completely. Do not to hold pee for long periods of time. ? Empty your bladder before and after sex. ? Wipe from front to back after a bowel movement if you are female. Use each tissue one time when you wipe. Contact a doctor if:  You have back pain.  You have a fever.  You feel sick to your stomach (nauseous).  You throw up (vomit).  Your symptoms do not get better after 3 days.  Your symptoms go away and then come back. Get help right away if:  You have very bad back pain.  You have very bad lower belly (abdominal) pain.  You are throwing up and cannot keep down any medicines or water. This information is not intended to replace advice given to you by your health care provider. Make sure you discuss any questions you have with your health care provider. Document Released: 01/06/2008 Document Revised: 12/26/2015 Document Reviewed: 06/10/2015 Elsevier Interactive Patient Education   2018 [employer]   Back Pain, Adult Back pain is very common. The pain often gets better over time. The cause of back pain is usually not dangerous. Most people can learn to manage their back pain on their own. Follow these instructions at home: Watch your back pain for any changes. The following actions may help to lessen any pain you are feeling:  Stay active. Start with short walks on flat ground if you can. Try to walk farther each day.  Exercise regularly as told by your doctor. Exercise helps your back heal faster. It also helps avoid future injury by keeping your muscles strong and flexible.  Do not sit, drive, or stand in one place for more than 30 minutes.  Do not stay in bed. Resting more than 1-2 days can slow down your recovery.  Be careful when you bend or lift an object. Use good form when lifting: ? Bend at your knees. ? Keep the object close to your body. ? Do not twist.  Sleep on a firm mattress. Lie on your side, and bend your knees. If you lie on your back, put a pillow under your knees.  Take medicines only as told by your doctor.  Put ice on the injured area. ? Put ice in a plastic bag. ? Place a towel between your skin and the bag. ? Leave the ice on for 20 minutes, 2-3 times a day for the first 2-3 days. After that, you  can switch between ice and heat packs.  Avoid feeling anxious or stressed. Find good ways to deal with stress, such as exercise.  Maintain a healthy weight. Extra weight puts stress on your back.  Contact a doctor if:  You have pain that does not go away with rest or medicine.  You have worsening pain that goes down into your legs or buttocks.  You have pain that does not get better in one week.  You have pain at night.  You lose weight.  You have a fever or chills. Get help right away if:  You cannot control when you poop (bowel movement) or pee (urinate).  Your arms or legs feel weak.  Your arms or legs lose feeling  (numbness).  You feel sick to your stomach (nauseous) or throw up (vomit).  You have belly (abdominal) pain.  You feel like you may pass out (faint). This information is not intended to replace advice given to you by your health care provider. Make sure you discuss any questions you have with your health care provider. Document Released: 01/06/2008 Document Revised: 12/26/2015 Document Reviewed: 11/21/2013 Elsevier Interactive Patient Education  [employer].

## 2018-06-02 NOTE — Progress Notes (Signed)
Spoke with patient this evening by telephone and she is having back pain that has not worsened but not improved. After review of history which includes RA and ankylosing spondylitis, opted to switch antibiotic to cipro 500 mg BID x 10 days. At the time of the exam, CVA tenderness and fever were not present but with further review and speaking with patient by telephone opted to cover with cipro. Further advised patient to seek medical attention immediately if symptoms are worsening such as flank pain and fever. She will start antibiotic tonight and agreed for close monitoring and return precautions provided. She voiced understanding and agreed with plan to seek immediate medical attention if symptoms worsen.

## 2018-06-02 NOTE — Progress Notes (Signed)
Maria Wiley - 42 y.o. female MRN [id number]  Date of birth: 12-25-1975  SUBJECTIVE:  Including CC & ROS.  Chief Complaint  Patient presents with  . Follow-up    L knee swelling    Maria Wiley is a 42 y.o. female that is here today to f/u for L knee swelling.  Her last visit with Dr. Jordan Likes was on 01/20/18 when she had a steroid injection in her L knee.  She states that she is having swelling in her L ant knee and pain that she rates as a 4-5/10.  She reports pain w/ walking and ascending stairs.  She has been using Pennsaid and doing her HEP intermittently.  She states that she is currently being treated for a UTI/kidney infection.  Felipa Emory, ATC, have served as a Stage manager for Dr. Jordan Likes today.  Review of the knee x-rays from 10/22/2016 show mild bilateral medial knee OA.  Review of Systems  Constitutional: Negative for fever.  HENT: Negative for congestion.   Respiratory: Negative for cough.   Cardiovascular: Negative for chest pain.  Gastrointestinal: Negative for abdominal distention.  Musculoskeletal: Positive for gait problem.  Skin: Negative for color change.  Neurological: Negative for weakness.  Hematological: Negative for adenopathy.  Psychiatric/Behavioral: Negative for agitation.    HISTORY: Past Medical, Surgical, Social, and Family History Reviewed & Updated per EMR.   Pertinent Historical Findings include:  Past Medical History:  Diagnosis Date  . Chest pain   . GERD (gastroesophageal reflux disease)   . Migraines   . RA (rheumatoid arthritis) (HCC)   . Thyroid disease   . Urinary incontinence     Past Surgical History:  Procedure Laterality Date  . ABDOMINAL HYSTERECTOMY    . APPENDECTOMY    . CHOLECYSTECTOMY    . HERNIA REPAIR  2004  . KNEE SURGERY Right 2006    No Known Allergies  Family History  Problem Relation Age of Onset  . CAD Maternal Grandfather   . Diabetes Maternal Grandfather   . Hearing loss  Maternal Grandfather   . Heart disease Maternal Grandfather   . Hyperlipidemia Maternal Grandfather   . CAD Paternal Grandfather   . Hypertension Mother   . Arthritis Mother   . Arthritis Maternal Grandmother   . Heart disease Maternal Grandmother   . Hyperlipidemia Maternal Grandmother      Social History   Socioeconomic History  . Marital status: Married    Spouse name: Not on file  . Number of children: Not on file  . Years of education: Not on file  . Highest education level: Not on file  Occupational History  . Not on file  Social Needs  . Financial resource strain: Not on file  . Food insecurity:    Worry: Not on file    Inability: Not on file  . Transportation needs:    Medical: Not on file    Non-medical: Not on file  Tobacco Use  . Smoking status: Never Smoker  . Smokeless tobacco: Never Used  Substance and Sexual Activity  . Alcohol use: Never    Frequency: Never  . Drug use: Never  . Sexual activity: Yes  Lifestyle  . Physical activity:    Days per week: Not on file    Minutes per session: Not on file  . Stress: Not on file  Relationships  . Social connections:    Talks on phone: Not on file    Gets together: Not on file  Attends religious service: Not on file    Active member of club or organization: Not on file    Attends meetings of clubs or organizations: Not on file    Relationship status: Not on file  . Intimate partner violence:    Fear of current or ex partner: Not on file    Emotionally abused: Not on file    Physically abused: Not on file    Forced sexual activity: Not on file  Other Topics Concern  . Not on file  Social History Narrative  . Not on file     PHYSICAL EXAM:  VS: BP 120/82 (BP Location: Left Arm, Patient Position: Sitting, Cuff Size: Large)   Ht 5\' 7"  (1.702 m)   Wt 230 lb (104.3 kg)   BMI 36.02 kg/m  Physical Exam Gen: NAD, alert, cooperative with exam, well-appearing ENT: normal lips, normal nasal mucosa,    Eye: normal EOM, normal conjunctiva and lids CV:  no edema, +2 pedal pulses   Resp: no accessory muscle use, non-labored,  Skin: no rashes, no areas of induration  Neuro: normal tone, normal sensation to touch Psych:  normal insight, alert and oriented MSK:  Left knee:  No effusion  Normal ROM  Normal strength to resistance  No instability  No pain with patellar grind or compression  Negative McMurray's test  Neurovascularly intact    Aspiration/Injection Procedure Note Maria Wiley May 18, 1976  Procedure: Injection Indications: left knee pain   Procedure Details Consent: Risks of procedure as well as the alternatives and risks of each were explained to the (patient/caregiver).  Consent for procedure obtained. Time Out: Verified patient identification, verified procedure, site/side was marked, verified correct patient position, special equipment/implants available, medications/allergies/relevent history reviewed, required imaging and test results available.  Performed.  The area was cleaned with iodine and alcohol swabs.    The left knee superior lateral suprapatellar pouch was injected using 1 cc's of 40 mg kenalog and 4 cc's of 0.5% bupivacaine with a 22 1 1/2" needle.  Ultrasound was used. Images were obtained in Long views showing the injection.    A sterile dressing was applied.  Patient did tolerate procedure well.     ASSESSMENT & PLAN:   OA (osteoarthritis) of knee Acute on chronic in nature. Likely degenerative component and PF syndrome.  - injection today  - has focus plan so she can supply her own gel injection (hymovis)  -Counseled on home exercise therapy and supportive care. -May need updated imaging. -Could consider physical therapy   The above documentation has been reviewed and is accurate and complete. 09/17/1975, MD 06/06/2018, 3:50 PM>

## 2018-06-03 ENCOUNTER — Ambulatory Visit (INDEPENDENT_AMBULATORY_CARE_PROVIDER_SITE_OTHER): Payer: No Typology Code available for payment source | Admitting: Family Medicine

## 2018-06-03 ENCOUNTER — Telehealth: Payer: Self-pay | Admitting: *Deleted

## 2018-06-03 ENCOUNTER — Ambulatory Visit: Payer: Self-pay

## 2018-06-03 ENCOUNTER — Encounter: Payer: Self-pay | Admitting: Family Medicine

## 2018-06-03 VITALS — BP 120/82 | Ht 67.0 in | Wt 230.0 lb

## 2018-06-03 DIAGNOSIS — M25562 Pain in left knee: Secondary | ICD-10-CM

## 2018-06-03 DIAGNOSIS — M1712 Unilateral primary osteoarthritis, left knee: Secondary | ICD-10-CM

## 2018-06-03 DIAGNOSIS — G8929 Other chronic pain: Secondary | ICD-10-CM | POA: Diagnosis not present

## 2018-06-03 LAB — URINE CULTURE
MICRO NUMBER:: 91311803
SPECIMEN QUALITY:: ADEQUATE

## 2018-06-03 NOTE — Telephone Encounter (Signed)
-----   Message from Roddie Mc, FNP sent at 06/03/2018  8:18 AM EDT ----- Cleone Slim Shirron,  Will you please contact Ms. Ahmad and ask her to schedule a follow up appointment with Dr. Okey Dupre next week. I switched her antibiotic yesterday evening and would like close follow up to see how she is doing. I advised her to follow up sooner if symptoms are not improving.  Thank you for your help, Raynelle Fanning

## 2018-06-03 NOTE — Telephone Encounter (Signed)
Called pt per msg below from [facility]. Made pt f/u appt for 11/13 w/dr. Okey Dupre.Marland KitchenRaechel Chute

## 2018-06-03 NOTE — Patient Instructions (Signed)
Good to see you  Please see me at [facility] and we could do gel injections called Hymovis. We could also try PRP if you would like  Please see me back in 3-4 weeks.

## 2018-06-06 ENCOUNTER — Ambulatory Visit
Admission: RE | Admit: 2018-06-06 | Discharge: 2018-06-06 | Disposition: A | Discharge status: Home / Self Care | Payer: No Typology Code available for payment source | Source: Ambulatory Visit | Attending: Internal Medicine | Admitting: Internal Medicine

## 2018-06-06 ENCOUNTER — Ambulatory Visit: Payer: No Typology Code available for payment source

## 2018-06-06 ENCOUNTER — Telehealth: Payer: Self-pay

## 2018-06-06 DIAGNOSIS — N63 Unspecified lump in unspecified breast: Secondary | ICD-10-CM

## 2018-06-06 NOTE — Telephone Encounter (Signed)
-----   Message from Roddie Mc, FNP sent at 06/04/2018  9:03 AM EDT ----- Urine culture does not indicate an infection. Antibiotic can be stopped and advise follow up with Dr. Okey Dupre for further evaluation of symptoms.

## 2018-06-06 NOTE — Assessment & Plan Note (Signed)
Acute on chronic in nature. Likely degenerative component and PF syndrome.  - injection today  - has focus plan so she can supply her own gel injection (hymovis)  -Counseled on home exercise therapy and supportive care. -May need updated imaging. -Could consider physical therapy

## 2018-06-06 NOTE — Telephone Encounter (Signed)
Pt has viewed results via MyChart  

## 2018-06-10 ENCOUNTER — Ambulatory Visit (INDEPENDENT_AMBULATORY_CARE_PROVIDER_SITE_OTHER): Payer: Self-pay | Admitting: Nurse Practitioner

## 2018-06-10 VITALS — BP 140/90 | HR 75 | Temp 98.4°F | Resp 18

## 2018-06-10 DIAGNOSIS — B373 Candidiasis of vulva and vagina: Secondary | ICD-10-CM

## 2018-06-10 DIAGNOSIS — B3731 Acute candidiasis of vulva and vagina: Secondary | ICD-10-CM

## 2018-06-10 MED ORDER — FLUCONAZOLE 150 MG PO TABS: 150.0000 mg | ORAL_TABLET | Freq: Once | ORAL | 0 refills | Status: AC

## 2018-06-10 NOTE — Progress Notes (Signed)
Subjective:     Maria Wiley is a 42 y.o. female who presents for evaluation of vaginal itching. Symptoms have been present for 1 day. Vaginal symptoms: local irritation and vulvar itching. Contraception: none. She denies discharge, dyspareunia and urinary symptoms of urinary frequency, urinary hesitancy, urinary incontinence and urinary urgency Sexually transmitted infection risk: very low risk of STD exposure. Menstrual flow: regular every 28-30 days.  The patient informs that she has recently been taking antibiotics for a UTI.  The following portions of the patient's history were reviewed and updated as appropriate: allergies, current medications and past medical history.   Review of Systems Constitutional: negative Ears, nose, mouth, throat, and face: negative Respiratory: negative Cardiovascular: negative Gastrointestinal: negative Genitourinary:positive for See HPI, negative for vaginal discharge, dysuria and frequency    Objective:    BP 140/90 (BP Location: Right Arm, Patient Position: Sitting, Cuff Size: Normal)   Pulse 75   Temp 98.4 F (36.9 C) (Oral)   Resp 18   SpO2 97%  General appearance: alert, cooperative and no distress Head: Normocephalic, without obvious abnormality, atraumatic Ears: normal TM's and external ear canals both ears Nose: Nares normal. Septum midline. Mucosa normal. No drainage or sinus tenderness. Throat: lips, mucosa, and tongue normal; teeth and gums normal Lungs: clear to auscultation bilaterally Heart: regular rate and rhythm, S1, S2 normal, no murmur, click, rub or gallop Abdomen: soft, non-tender; bowel sounds normal; no masses,  no organomegaly Pelvic: no discharge, local irritation    Assessment:    Yeast Infection.    Plan:   Exam findings, diagnosis etiology and medication use and indications reviewed with patient. Follow- Up and discharge instructions provided. No emergent/urgent issues found on exam. Patient education was  provided. Patient verbalized understanding of information provided and agrees with plan of care (POC), all questions answered. The patient is advised to call or return to clinic if condition does not see an improvement in symptoms, or to seek the care of the closest emergency department if condition worsens with the above plan.   1. Vulvovaginal candidiasis  - fluconazole (DIFLUCAN) 150 MG tablet; Take 1 tablet (150 mg total) by mouth once for 1 dose. May repeat every 72 hours up to 3 doses.  Dispense: 3 tablet; Refill: 0

## 2018-06-15 ENCOUNTER — Ambulatory Visit: Payer: No Typology Code available for payment source | Admitting: Internal Medicine

## 2018-06-20 ENCOUNTER — Encounter: Payer: Self-pay | Admitting: Internal Medicine

## 2018-06-20 ENCOUNTER — Ambulatory Visit (INDEPENDENT_AMBULATORY_CARE_PROVIDER_SITE_OTHER): Payer: No Typology Code available for payment source | Admitting: Internal Medicine

## 2018-06-20 VITALS — BP 130/90 | HR 88 | Temp 98.7°F | Ht 67.0 in | Wt 230.0 lb

## 2018-06-20 DIAGNOSIS — R109 Unspecified abdominal pain: Secondary | ICD-10-CM

## 2018-06-20 LAB — POCT URINALYSIS DIPSTICK
Bilirubin, UA: NEGATIVE
Blood, UA: NEGATIVE
Glucose, UA: NEGATIVE
Ketones, UA: NEGATIVE
Leukocytes, UA: NEGATIVE
Nitrite, UA: NEGATIVE
Protein, UA: NEGATIVE
Spec Grav, UA: >=1.03 — AB (ref 1.010–1.025)
Urobilinogen, UA: 0.2 E.U./dL
pH, UA: 6 (ref 5.0–8.0)

## 2018-06-20 NOTE — Patient Instructions (Signed)
We will just see if this gets better. If it gets worse instead call or send mychart message and we will check a scan of the stomach.

## 2018-06-20 NOTE — Assessment & Plan Note (Signed)
Overall is resolving gradually. Will continue to monitor. If worsening or becoming more often will get CT abdomen non-contrast to rule out kidney stone. U/A done in the office today consistent with normal urine and no infection.

## 2018-06-20 NOTE — Progress Notes (Signed)
   Subjective:    Patient ID: Maria Wiley, female    DOB: 1975-12-26, 42 y.o.   MRN: [id number]  HPI The patient is a 42 YO female coming in for follow up of positive U/A treated for infection. She was having some left flank pain at the time which has improved some. She is still having this pain off and on. A couple times a week at least with pain starting in left back and radiating around to the front. She denies nausea or vomiting. Denies diarrhea or constipation. Pain is 7/10 and lasts about 2 hours or so as it gradually fades. Denies blood in stool. Eating and drinking normally. No triggers she can identify. Does not hurt in between. Does not hurt like her normal back pain from ankylosing spondylitis. She denies fevers or chills. Got yeast infection from antibiotics and was seen and treated and this is resolving. No burning with urination. Denies other new problems.   Review of Systems  Constitutional: Negative.   HENT: Negative.   Eyes: Negative.   Respiratory: Negative for cough, chest tightness and shortness of breath.   Cardiovascular: Negative for chest pain, palpitations and leg swelling.  Gastrointestinal: Negative for abdominal distention, abdominal pain, constipation, diarrhea, nausea and vomiting.  Genitourinary: Positive for flank pain. Negative for decreased urine volume, difficulty urinating, dyspareunia, dysuria, enuresis, frequency, genital sores, pelvic pain and urgency.  Musculoskeletal: Negative for arthralgias, back pain, gait problem, myalgias and neck pain.  Skin: Negative.   Neurological: Negative.   Psychiatric/Behavioral: Negative.       Objective:   Physical Exam  Constitutional: She is oriented to person, place, and time. She appears well-developed and well-nourished.  HENT:  Head: Normocephalic and atraumatic.  Eyes: EOM are normal.  Neck: Normal range of motion.  Cardiovascular: Normal rate and regular rhythm.  Pulmonary/Chest: Effort normal and  breath sounds normal. No respiratory distress. She has no wheezes. She has no rales.  Abdominal: Soft. Bowel sounds are normal. She exhibits no distension. There is no tenderness. There is no rebound.  Musculoskeletal: She exhibits no edema or tenderness.  No pain on exam  Neurological: She is alert and oriented to person, place, and time. Coordination normal.  Skin: Skin is warm and dry.   Vitals:   06/20/18 0847  BP: 130/90  Pulse: 88  Temp: 98.7 F (37.1 C)  TempSrc: Oral  SpO2: 98%  Weight: 230 lb (104.3 kg)  Height: 5\' 7"  (1.702 m)      Assessment & Plan:

## 2018-07-04 ENCOUNTER — Ambulatory Visit (INDEPENDENT_AMBULATORY_CARE_PROVIDER_SITE_OTHER): Payer: Self-pay | Admitting: Family Medicine

## 2018-07-04 VITALS — BP 132/94 | HR 74 | Temp 98.5°F | Resp 16

## 2018-07-04 DIAGNOSIS — H6981 Other specified disorders of Eustachian tube, right ear: Secondary | ICD-10-CM

## 2018-07-04 NOTE — Patient Instructions (Signed)
Eustachian Tube Dysfunction The eustachian tube connects the middle ear to the back of the nose. It regulates air pressure in the middle ear by allowing air to move between the ear and nose. It also helps to drain fluid from the middle ear space. When the eustachian tube does not function properly, air pressure, fluid, or both can build up in the middle ear. Eustachian tube dysfunction can affect one or both ears. What are the causes? This condition happens when the eustachian tube becomes blocked or cannot open normally. This may result from:  Ear infections.  Colds and other upper respiratory infections.  Allergies.  Irritation, such as from cigarette smoke or acid from the stomach coming up into the esophagus (gastroesophageal reflux).  Sudden changes in air pressure, such as from descending in an airplane.  Abnormal growths in the nose or throat, such as nasal polyps, tumors, or enlarged tissue at the back of the throat (adenoids).  What increases the risk? This condition may be more likely to develop in people who smoke and people who are overweight. Eustachian tube dysfunction may also be more likely to develop in children, especially children who have:  Certain birth defects of the mouth, such as cleft palate.  Large tonsils and adenoids.  What are the signs or symptoms? Symptoms of this condition may include:  A feeling of fullness in the ear.  Ear pain.  Clicking or popping noises in the ear.  Ringing in the ear.  Hearing loss.  Loss of balance.  Symptoms may get worse when the air pressure around you changes, such as when you travel to an area of high elevation or fly on an airplane. How is this diagnosed? This condition may be diagnosed based on:  Your symptoms.  A physical exam of your ear, nose, and throat.  Tests, such as those that measure: ? The movement of your eardrum (tympanogram). ? Your hearing (audiometry).  How is this treated? Treatment  depends on the cause and severity of your condition. If your symptoms are mild, you may be able to relieve your symptoms by moving air into ("popping") your ears. If you have symptoms of fluid in your ears, treatment may include:  Decongestants.  Antihistamines.  Nasal sprays or ear drops that contain medicines that reduce swelling (steroids).  In some cases, you may need to have a procedure to drain the fluid in your eardrum (myringotomy). In this procedure, a small tube is placed in the eardrum to:  Drain the fluid.  Restore the air in the middle ear space.  Follow these instructions at home:  Take over-the-counter and prescription medicines only as told by your health care provider.  Use techniques to help pop your ears as recommended by your health care provider. These may include: ? Chewing gum. ? Yawning. ? Frequent, forceful swallowing. ? Closing your mouth, holding your nose closed, and gently blowing as if you are trying to blow air out of your nose.  Do not do any of the following until your health care provider approves: ? Travel to high altitudes. ? Fly in airplanes. ? Work in a pressurized cabin or room. ? Scuba dive.  Keep your ears dry. Dry your ears completely after showering or bathing.  Do not smoke.  Keep all follow-up visits as told by your health care provider. This is important. Contact a health care provider if:  Your symptoms do not go away after treatment.  Your symptoms come back after treatment.  You are   unable to pop your ears.  You have: ? A fever. ? Pain in your ear. ? Pain in your head or neck. ? Fluid draining from your ear.  Your hearing suddenly changes.  You become very dizzy.  You lose your balance. This information is not intended to replace advice given to you by your health care provider. Make sure you discuss any questions you have with your health care provider. Document Released: 08/16/2015 Document Revised: 12/26/2015  Document Reviewed: 08/08/2014 Elsevier Interactive Patient Education  2018 [employer]  

## 2018-07-04 NOTE — Progress Notes (Signed)
Maria Wiley is a 42 y.o. female who presents today with concerns of sharp intermittent ear pain for 3 days. She denies using any medications to treat this condition and reports one previous pain like occurrence earlier this year.  Review of Systems  Constitutional: Negative for chills, fever and malaise/fatigue.  HENT: Positive for ear pain. Negative for congestion, ear discharge, hearing loss, sinus pain, sore throat and tinnitus.   Eyes: Negative.   Respiratory: Negative for cough, sputum production and shortness of breath.   Cardiovascular: Negative.  Negative for chest pain.  Gastrointestinal: Negative for abdominal pain, diarrhea, nausea and vomiting.  Genitourinary: Negative for dysuria, frequency, hematuria and urgency.  Musculoskeletal: Negative for myalgias.  Skin: Negative.   Neurological: Negative for headaches.  Endo/Heme/Allergies: Negative.   Psychiatric/Behavioral: Negative.     O: Vitals:   07/04/18 1342  BP: (!) 132/94  Pulse: 74  Resp: 16  Temp: 98.5 F (36.9 C)  SpO2: 97%     Physical Exam  Constitutional: She is oriented to person, place, and time. Vital signs are normal. She appears well-developed and well-nourished. She is active.  Non-toxic appearance. She does not have a sickly appearance.  HENT:  Head: Normocephalic.  Right Ear: Hearing, tympanic membrane, external ear and ear canal normal.  Left Ear: Hearing, tympanic membrane, external ear and ear canal normal.  Nose: Nose normal.  Mouth/Throat: Uvula is midline and oropharynx is clear and moist.  Neck: Normal range of motion. Neck supple. No JVD present.  Cardiovascular: Normal pulses. Exam reveals no friction rub.  Musculoskeletal: Normal range of motion.  Lymphadenopathy:       Head (right side): No submental and no submandibular adenopathy present.       Head (left side): No submental and no submandibular adenopathy present.    She has no cervical adenopathy.  Neurological: She is  alert and oriented to person, place, and time.  Psychiatric: She has a normal mood and affect.  Vitals reviewed.    A: 1. Eustachian tube dysfunction, right    P: Discussed exam findings, diagnosis etiology and medication use and indications reviewed with patient. Follow- Up and discharge instructions provided. No emergent/urgent issues found on exam.  Patient verbalized understanding of information provided and agrees with plan of care (POC), all questions answered.  1. Eustachian tube dysfunction, right Discussed typical treatment of effusion vs. eustachian tube dysfunction medical management versus supportive self care.  Patient declines steroid/abx typical treatment and would like to trial self care and watchful waiting of symptoms. Provider agrees this is a option as the first step to treatment. Both TM on exam were WNL and unremarkable. Advised supportive measures of antihistamine, tylenol/motrin, and gum chewing and use of patient instructions on how to address symptoms and pain.

## 2018-08-10 ENCOUNTER — Encounter: Payer: Self-pay | Admitting: Internal Medicine

## 2018-08-16 MED FILL — LEVOTHYROXINE 25 MCG TABLET: 25 | 90 days supply | Qty: 90 | Fill #0

## 2018-09-30 ENCOUNTER — Encounter: Payer: Self-pay | Admitting: Pharmacist

## 2018-09-30 ENCOUNTER — Ambulatory Visit (INDEPENDENT_AMBULATORY_CARE_PROVIDER_SITE_OTHER): Payer: No Typology Code available for payment source | Admitting: Pharmacist

## 2018-09-30 DIAGNOSIS — Z79899 Other long term (current) drug therapy: Secondary | ICD-10-CM

## 2018-09-30 MED ORDER — ETANERCEPT 50 MG/ML ~~LOC~~ SOAJ: 50.0000 mg | SUBCUTANEOUS | 1 refills | Status: DC

## 2018-09-30 NOTE — Progress Notes (Signed)
   S: [facility] therapy.  Patient is currently taking Enbrel for rheumatoid arthritis. Patient is managed by Azucena Fallen for this.   Adherence: denies any missed doses  Efficacy: working well for her, has been on for a while.  Dosing: 50 mg daily  Ankylosing spondylitis, psoriatic arthritis, rheumatoid arthritis: SubQ: Note: May continue methotrexate, glucocorticoids, salicylates, NSAIDs, or analgesics during etanercept therapy. Once-weekly dosing: 50 mg once weekly; maximum dose (rheumatoid arthritis): 50 mg/week. Twice-weekly dosing (off-label dose): 25 mg twice weekly (Bathon 2000; Calin 2004; Davis 2003; Genovese 2002; Mease 2000; Mease 2002)   Screening: TB test: completed per patient Hepatitis B: completed per patient  Monitoring: Injection site reactions: reports that she has had them in the past but they have gotten better S/sx of infections: denies S/sx of malignancy: denies GI upset: denies   O:     Lab Results  Component Value Date   WBC 8.0 09/03/2017   HGB 11.5 (L) 09/03/2017   HCT 36.5 09/03/2017   MCV 80.4 09/03/2017   PLT 278 09/03/2017      Chemistry      Component Value Date/Time   NA 136 09/03/2017 1106   K 3.8 09/03/2017 1106   CL 103 09/03/2017 1106   CO2 23 09/03/2017 1106   BUN 7 09/03/2017 1106   CREATININE 0.74 09/03/2017 1106      Component Value Date/Time   CALCIUM 9.1 09/03/2017 1106       A/P: 1. Medication review: patient currently on Enbrel for rheumatoid arthritis and is tolerating it well. Reviewed the medication with the patient, including the following: Enbrel (etanercept) binds tumor necrosis factor (TNF) and blocks its interaction with cell surface receptors. TNF plays an important role in the inflammatory processes of many diseases. Patient educated on purpose, proper use and potential adverse effects of Enbrel. Adverse effects include rash, GI  upset, increased risk of infection, and injection site reactions. Patients should stop Enbrel if they develop a serious infection. There is a possible increased risk in lymphoma and other malignancies. No recommendations for any changes.    Alvino Blood, PharmD, BCPS, BCACP, CPP Clinical Pharmacist Practitioner  [phone]

## 2018-10-04 MED FILL — DICLOFENAC SODIUM 75 MG TAB: 75 | 30 days supply | Qty: 60 | Fill #0

## 2018-10-06 MED FILL — ENBREL SURECLICK 50 MG/ML S: 50 | 28 days supply | Qty: 4 | Fill #0 | Status: TO

## 2018-10-11 ENCOUNTER — Ambulatory Visit (INDEPENDENT_AMBULATORY_CARE_PROVIDER_SITE_OTHER): Payer: No Typology Code available for payment source | Admitting: Internal Medicine

## 2018-10-11 ENCOUNTER — Other Ambulatory Visit (INDEPENDENT_AMBULATORY_CARE_PROVIDER_SITE_OTHER): Payer: No Typology Code available for payment source

## 2018-10-11 ENCOUNTER — Encounter: Payer: Self-pay | Admitting: Internal Medicine

## 2018-10-11 VITALS — BP 128/80 | HR 74 | Temp 98.5°F | Ht 67.0 in | Wt 234.0 lb

## 2018-10-11 DIAGNOSIS — R531 Weakness: Secondary | ICD-10-CM | POA: Diagnosis not present

## 2018-10-11 DIAGNOSIS — G4452 New daily persistent headache (NDPH): Secondary | ICD-10-CM | POA: Diagnosis not present

## 2018-10-11 DIAGNOSIS — R29818 Other symptoms and signs involving the nervous system: Secondary | ICD-10-CM

## 2018-10-11 DIAGNOSIS — R4789 Other speech disturbances: Secondary | ICD-10-CM | POA: Insufficient documentation

## 2018-10-11 LAB — COMPREHENSIVE METABOLIC PANEL
ALT: 5 U/L (ref 0–35)
AST: 12 U/L (ref 0–37)
Albumin: 4 g/dL (ref 3.5–5.2)
Alkaline Phosphatase: 59 U/L (ref 39–117)
BUN: 9 mg/dL (ref 6–23)
CO2: 24 mEq/L (ref 19–32)
Calcium: 9.1 mg/dL (ref 8.4–10.5)
Chloride: 106 mEq/L (ref 96–112)
Creatinine, Ser: 0.77 mg/dL (ref 0.40–1.20)
GFR: 98.5 mL/min (ref >60.00)
Glucose, Bld: 89 mg/dL (ref 70–99)
Potassium: 3.8 mEq/L (ref 3.5–5.1)
Sodium: 137 mEq/L (ref 135–145)
Total Bilirubin: 0.5 mg/dL (ref 0.2–1.2)
Total Protein: 7 g/dL (ref 6.0–8.3)

## 2018-10-11 LAB — TSH: TSH: 0.8 u[IU]/mL (ref 0.35–4.50)

## 2018-10-11 LAB — LIPID PANEL
Cholesterol: 176 mg/dL (ref 0–200)
HDL: 38.8 mg/dL — ABNORMAL LOW (ref >39.00)
LDL Cholesterol: 116 mg/dL — ABNORMAL HIGH (ref 0–99)
NonHDL: 137.5
Total CHOL/HDL Ratio: 5
Triglycerides: 108 mg/dL (ref 0.0–149.0)
VLDL: 21.6 mg/dL (ref 0.0–40.0)

## 2018-10-11 LAB — CBC
HCT: 35.9 % — ABNORMAL LOW (ref 36.0–46.0)
Hemoglobin: 11.5 g/dL — ABNORMAL LOW (ref 12.0–15.0)
MCHC: 32 g/dL (ref 30.0–36.0)
MCV: 79.8 fl (ref 78.0–100.0)
Platelets: 249 10*3/uL (ref 150.0–400.0)
RBC: 4.49 Mil/uL (ref 3.87–5.11)
RDW: 14.4 % (ref 11.5–15.5)
WBC: 5.8 10*3/uL (ref 4.0–10.5)

## 2018-10-11 LAB — VITAMIN B12: Vitamin B-12: 245 pg/mL (ref 211–911)

## 2018-10-11 LAB — VITAMIN D 25 HYDROXY (VIT D DEFICIENCY, FRACTURES): VITD: 26.2 ng/mL — ABNORMAL LOW (ref 30.00–100.00)

## 2018-10-11 LAB — T4, FREE: Free T4: 1.28 ng/dL (ref 0.60–1.60)

## 2018-10-11 LAB — HEMOGLOBIN A1C: Hgb A1c MFr Bld: 5.7 % (ref 4.6–6.5)

## 2018-10-11 MED ORDER — ESCITALOPRAM OXALATE 20 MG PO TABS: 10.0000 mg | ORAL_TABLET | Freq: Every day | ORAL | 0 refills | Status: DC

## 2018-10-11 MED FILL — ESCITALOPRAM 20 MG TABLET: 20 | 90 days supply | Qty: 45 | Fill #0 | Status: TO

## 2018-10-11 NOTE — Progress Notes (Signed)
Subjective:   Patient ID: Maria MiyamotoKimberly Kimshawn Markoff, female    DOB: 1975-12-25, 43 y.o.   MRN: [id number]  HPI The patient is a 43 YO female coming in for memory concerns including forgetting whole conversations about things. She noticed this starting about 1-2 months ago. Along with this she has noticed some changes in the right hand with some mild shaking and dropping more things. She was not sure of the cause. Does not think that the left hand is doing this. Denies changes in medications. She also feels that she is having more headaches in the last 1 month or so. She will get headaches in the middle of the night which wake her from sleep and last 1-2 days. Had this for a week solid and now several per week. This is very unusual for her. She is also having some slurred speech which is new in the last 4-6 weeks. She denies that speech sounds weird but she does feel at times while speaking that she has to struggle more to get the words out right. She denies forgetting people's names. She struggles to remember tasks which she has agreed to. This is very different and concerning to her. She has child (aged 487) diagnosed with type 1 diabetes in January but she felt that she was coping fairly okay with that. It has been a large change for the family but she feels okay about it. Denies SI/HI. Denies worsening anxiety symptoms. She is a little worried about the memory changes. No one has mentioned memory problems to her except her husband.   [facility], [facility] reviewed and updated   Review of Systems  Constitutional: Negative.   HENT: Negative.   Eyes: Negative.   Respiratory: Negative for cough, chest tightness and shortness of breath.   Cardiovascular: Negative for chest pain, palpitations and leg swelling.  Gastrointestinal: Negative for abdominal distention, abdominal pain, constipation, diarrhea, nausea and vomiting.  Musculoskeletal: Negative.   Skin: Negative.   Neurological: Positive for  speech difficulty, weakness and headaches. Negative for dizziness, seizures, facial asymmetry, light-headedness and numbness.  Psychiatric/Behavioral: Positive for sleep disturbance. Negative for agitation, behavioral problems, decreased concentration, dysphoric mood and self-injury. The patient is not nervous/anxious.     Objective:  Physical Exam Constitutional:      Appearance: She is well-developed.  HENT:     Head: Normocephalic and atraumatic.  Eyes:     Extraocular Movements: Extraocular movements intact.     Conjunctiva/sclera: Conjunctivae normal.     Pupils: Pupils are equal, round, and reactive to light.  Neck:     Musculoskeletal: Normal range of motion.  Cardiovascular:     Rate and Rhythm: Normal rate and regular rhythm.  Pulmonary:     Effort: Pulmonary effort is normal. No respiratory distress.     Breath sounds: Normal breath sounds. No wheezing or rales.  Abdominal:     General: Bowel sounds are normal. There is no distension.     Palpations: Abdomen is soft.     Tenderness: There is no abdominal tenderness. There is no rebound.  Skin:    General: Skin is warm and dry.  Neurological:     Mental Status: She is alert and oriented to person, place, and time.     Cranial Nerves: Cranial nerve deficit present.     Motor: Weakness present.     Coordination: Coordination normal.     Comments: 5/5 strength left upper extremity, 3/5 right upper extremity strength, lower right and left  5/5, sensation intact, CN 2-12 intact on exam     Vitals:   10/11/18 0812  BP: 128/80  Pulse: 74  Temp: 98.5 F (36.9 C)  TempSrc: Oral  SpO2: 97%  Weight: 234 lb (106.1 kg)  Height: 5\' 7"  (1.702 m)    Assessment & Plan:

## 2018-10-11 NOTE — Patient Instructions (Signed)
We will check the labs today and the MRI of the head to check for any problems.

## 2018-10-11 NOTE — Assessment & Plan Note (Signed)
Needs MRI given symptoms as well as new weakness in the right arm today on exam.

## 2018-10-11 NOTE — Assessment & Plan Note (Signed)
Concerning given her history and medications. She is also having some speech problems and memory problems. Needs MRI brain now. She is on medications which have increased risk of lymphoma and malignancy although there are not clear signs of this.

## 2018-10-11 NOTE — Assessment & Plan Note (Signed)
Several over the last 6 weeks which is concerning given constellation of new symptoms including new headaches, right sided weakness arm.

## 2018-10-16 ENCOUNTER — Other Ambulatory Visit: Payer: Self-pay

## 2018-10-16 ENCOUNTER — Ambulatory Visit
Admission: RE | Admit: 2018-10-16 | Discharge: 2018-10-16 | Disposition: A | Discharge status: Home / Self Care | Payer: No Typology Code available for payment source | Source: Ambulatory Visit | Attending: Internal Medicine | Admitting: Internal Medicine

## 2018-10-16 DIAGNOSIS — R29818 Other symptoms and signs involving the nervous system: Secondary | ICD-10-CM

## 2018-10-17 ENCOUNTER — Other Ambulatory Visit: Payer: Self-pay | Admitting: Internal Medicine

## 2018-10-17 DIAGNOSIS — G4452 New daily persistent headache (NDPH): Secondary | ICD-10-CM

## 2018-11-02 ENCOUNTER — Telehealth: Payer: Self-pay | Admitting: Neurology

## 2018-11-02 NOTE — Telephone Encounter (Signed)
Due to current COVID 19 pandemic, our office is severely reducing in office visits for at least the next 2 weeks, in order to minimize the risk to our patients and healthcare providers. Pt understands that although there may be some limitations with this type of visit, we will take all precautions to reduce any security or privacy concerns.  Pt understands that this will be treated like an in office visit and we will file with pt's insurance, and there may be a patient responsible charge related to this service. Pt's email is [email or web address] . Pt understands that the [employer] webex software must be downloaded and operational on the device pt plans to use for the visit.

## 2018-11-02 NOTE — Telephone Encounter (Signed)
Noted thank you

## 2018-11-03 MED FILL — ENBREL SURECLICK 50 MG/ML S: 50 | 28 days supply | Qty: 4 | Fill #0

## 2018-11-08 ENCOUNTER — Encounter: Payer: Self-pay | Admitting: *Deleted

## 2018-11-08 ENCOUNTER — Telehealth: Payer: Self-pay | Admitting: *Deleted

## 2018-11-08 ENCOUNTER — Other Ambulatory Visit: Payer: Self-pay | Admitting: Internal Medicine

## 2018-11-08 MED FILL — SOLIFENACIN SUCCINATE 10 MG: 10 | 30 days supply | Qty: 30 | Fill #0

## 2018-11-08 MED FILL — DOXYCYCLINE HYCLATE 100 MG: 100 | 30 days supply | Qty: 60 | Fill #0

## 2018-11-08 MED FILL — LEVOTHYROXINE 25 MCG TABLET: 25 | 90 days supply | Qty: 90 | Fill #0

## 2018-11-08 NOTE — Telephone Encounter (Signed)
Spoke with pt, confirmed name & DOB. Pt gave updates/confirmation of med/sx/soc/fam hx, meds, allergies, height/weight.

## 2018-11-09 ENCOUNTER — Other Ambulatory Visit: Payer: Self-pay

## 2018-11-09 ENCOUNTER — Ambulatory Visit: Payer: No Typology Code available for payment source | Admitting: Neurology

## 2018-11-09 ENCOUNTER — Ambulatory Visit (INDEPENDENT_AMBULATORY_CARE_PROVIDER_SITE_OTHER): Payer: No Typology Code available for payment source | Admitting: Neurology

## 2018-11-09 DIAGNOSIS — R4189 Other symptoms and signs involving cognitive functions and awareness: Secondary | ICD-10-CM

## 2018-11-09 DIAGNOSIS — G43709 Chronic migraine without aura, not intractable, without status migrainosus: Secondary | ICD-10-CM | POA: Diagnosis not present

## 2018-11-09 DIAGNOSIS — G9340 Encephalopathy, unspecified: Secondary | ICD-10-CM

## 2018-11-09 DIAGNOSIS — R51 Headache with orthostatic component, not elsewhere classified: Secondary | ICD-10-CM

## 2018-11-09 DIAGNOSIS — Z862 Personal history of diseases of the blood and blood-forming organs and certain disorders involving the immune mechanism: Secondary | ICD-10-CM | POA: Diagnosis not present

## 2018-11-09 DIAGNOSIS — R9089 Other abnormal findings on diagnostic imaging of central nervous system: Secondary | ICD-10-CM

## 2018-11-09 DIAGNOSIS — E538 Deficiency of other specified B group vitamins: Secondary | ICD-10-CM

## 2018-11-09 DIAGNOSIS — G441 Vascular headache, not elsewhere classified: Secondary | ICD-10-CM

## 2018-11-09 DIAGNOSIS — I776 Arteritis, unspecified: Secondary | ICD-10-CM

## 2018-11-09 MED ORDER — ERENUMAB-AOOE 140 MG/ML ~~LOC~~ SOAJ: 140.0000 mg | SUBCUTANEOUS | 11 refills | Status: DC

## 2018-11-09 NOTE — Progress Notes (Addendum)
[facility]    Provider:  Dr Lucia GaskinsAhern Requesting Provider: Myrlene Brokerrawford, Elizabeth A, * Primary Care Provider:  Myrlene Brokerrawford, Elizabeth A, MD  CC:  Headache    Virtual Visit via Video Note  I connected with@ on 11/20/18 at  8:30 AM EDT by a video enabled telemedicine application and verified that I am speaking with the correct person using two identifiers.   I discussed the limitations of evaluation and management by telemedicine and the availability of in person appointments. The patient expressed understanding and agreed to proceed.  Anson FretAhern,  B, MD  HPI:  Maria Wiley is a 43 y.o. female here as requested by Myrlene Brokerrawford, Elizabeth A, * for headache. PMHx IBS, hypothyroidism, RA, Ankylosing Spondylitis, OA, headache. She has a hx of migraines. 2-3 months ago she started forgetting things. She has to write everything down. She saw her pcp and recommended the MRI of the brain. She has some right hand tremors. The worse is the memory loss. No head trauma. No new medications. Probably ongoing longer than 2-3 months, wrote it off as getting older. She doesn't remember both remote and short-term memory. Her son has DM1 but has it under control so no stress. Family is noticing. At work she kees detailed notes so she doesn't forget, she is scared people will notice. She has to make lists and if she doesn't make the list she wont et anything done. She doesn't remember why she walks into stores. She may zone out, she thought it was attention maybe ADHD. She doesn't remember everything she thinks she should. She has missed some bills, she has a "bill book" and has forgotten to pay for something. No Hx of learning disabilities, did well in school. She can manage money, days seem to run together but she usually knows what day and month and year it is. Her family says she repeats things a lot. She has 4 boys. She sleeps fairly well, she has a lot of stress at work, she wakes up at  1am-4am, she can;t get a complete night's sleep but this new. She snores. She wakes up sometimes. She feels tired in the morning but she can still function. [place] just opened up [place] new clinic in [place] at work and this was stressful. She has anxiety, she doesn't ever unwind, she can;t just sit down and relax. She is on Lexapro for anxiety. She wakes up with headaches. Migraines starte 20+ years ago. Recently worsening, she cn wake up with pounding, on the left side of the head, they can be positional. She keeps bottles everywhere, 3-4 migraines a week. +light and sound and noise sensitivity. Migraines bad the last few years. 22 headache days a month, 12 migraine days, migraines can last 24-72 hours, no nausea or vomiting, she has episodes of black missing vision spots. At least 6 months at this frequency. No aura[facility]  Medications tried: Flexeril, lexapro, Topiramate contraindicated, tried nortriptyline  Reviewed notes, labs and imaging from outside physicians, which showed:  Labs 10/22/2017: RF 9.8, CCP < 1, CCP Igg neg, crp <5, CBC with mild anemia hgb 10.9, CMP normal BUN 9, creat 0.62, sed 25, hgba1c 5.7, TSH nml, ldl 116, b12 245  Personally reviewed MRI of the brain images and agree with the following:  1.  No acute intracranial abnormality. 2. Abnormal but nonspecific signal changes in the cerebral white matter, most notably in the right middle frontal gyrus and the posterior body of the corpus callosum. Top differential considerations  include accelerated/hereditary small vessel ischemia, sequelae of trauma, hypercoagulable state, vasculitis, prior infection or demyelination.  hgba1c 5.7, cmp nml, cbc mild anemia. B12 245(should check MMA)  Review of Systems: Patient complains of symptoms per HPI as well as the following symptoms: headaches, memory loss, cognitive changes, confusion. Pertinent negatives and positives per HPI. All others negative.   Social History    Socioeconomic History  . Marital status: Married    Spouse name: Not on file  . Number of children: 4  . Years of education: Not on file  . Highest education level: Bachelor's degree (e.g., BA, AB, BS)  Occupational History  . Not on file  Social Needs  . Financial resource strain: Not on file  . Food insecurity:    Worry: Not on file    Inability: Not on file  . Transportation needs:    Medical: Not on file    Non-medical: Not on file  Tobacco Use  . Smoking status: Never Smoker  . Smokeless tobacco: Never Used  Substance and Sexual Activity  . Alcohol use: Never    Frequency: Never  . Drug use: Never  . Sexual activity: Yes    Birth control/protection: Surgical  Lifestyle  . Physical activity:    Days per week: Not on file    Minutes per session: Not on file  . Stress: Not on file  Relationships  . Social connections:    Talks on phone: Not on file    Gets together: Not on file    Attends religious service: Not on file    Active member of club or organization: Not on file    Attends meetings of clubs or organizations: Not on file    Relationship status: Not on file  . Intimate partner violence:    Fear of current or ex partner: Not on file    Emotionally abused: Not on file    Physically abused: Not on file    Forced sexual activity: Not on file  Other Topics Concern  . Not on file  Social History Narrative   Lives at home with her husband & her 4 sons   Right handed   Caffeine: "maybe 2" cups per day    Family History  Problem Relation Age of Onset  . CAD Maternal Grandfather   . Diabetes Maternal Grandfather   . Hearing loss Maternal Grandfather   . Heart disease Maternal Grandfather   . Hyperlipidemia Maternal Grandfather   . Hypertension Mother   . Arthritis Mother   . Arthritis Maternal Grandmother   . Heart disease Maternal Grandmother   . Hyperlipidemia Maternal Grandmother   . Migraines Neg Hx   . Headache Neg Hx     Past Medical History:   Diagnosis Date  . Ankylosing spondylitis (HCC)   . Chest pain   . GERD (gastroesophageal reflux disease)   . Migraines   . RA (rheumatoid arthritis) (HCC)   . Thyroid disease   . Urinary incontinence     Patient Active Problem List   Diagnosis Date Noted  . Abdominal pain 11/16/2018  . Nausea vomiting and diarrhea 11/16/2018  . Fatigue 11/16/2018  . Weakness 10/11/2018  . New daily persistent headache 10/11/2018  . Episode of change in speech 10/11/2018  . Left flank pain 06/20/2018  . Hair loss 02/25/2018  . Ankylosing spondylitis (HCC) 01/15/2018  . OA (osteoarthritis) of knee 01/15/2018  . Routine general medical examination at a health care facility 01/15/2018  . IBS (irritable bowel  syndrome) 01/15/2018  . RA (rheumatoid arthritis) (HCC)   . Hypothyroidism     Past Surgical History:  Procedure Laterality Date  . ABDOMINAL HYSTERECTOMY    . APPENDECTOMY    . CHOLECYSTECTOMY    . HERNIA REPAIR  2004  . KNEE SURGERY Right 2006    Current Outpatient Medications  Medication Sig Dispense Refill  . cholecalciferol (VITAMIN D) 1000 units tablet Take 1,000 Units by mouth daily.    . Diclofenac Sodium (PENNSAID) 2 % SOLN Place 1 application onto the skin 2 (two) times daily. (Patient taking differently: Place 1 application onto the skin 2 (two) times daily as needed. ) 1 Bottle 3  . dicyclomine (BENTYL) 20 MG tablet Take 1 tablet (20 mg total) by mouth 2 (two) times daily for 5 days. 10 tablet 0  . doxycycline (VIBRAMYCIN) 100 MG capsule Take 100 mg by mouth 2 (two) times daily.   3  . Erenumab-aooe (AIMOVIG) 140 MG/ML SOAJ Inject 140 mg into the skin every 30 (thirty) days. 1 pen 11  . escitalopram (LEXAPRO) 20 MG tablet TAKE 1/2 TABLETS BY MOUTH ONCE A DAY 45 tablet 0  . etanercept (ENBREL SURECLICK) 50 MG/ML injection Inject 0.98 mLs (50 mg total) into the skin once a week. 4 Syringe 1  . famotidine (PEPCID) 20 MG tablet Take 1 tablet (20 mg total) by mouth 2 (two) times  daily for 5 days. 10 tablet 0  . folic acid (FOLVITE) 1 MG tablet Take 1 tablet (1 mg total) by mouth daily. 90 tablet 3  . leflunomide (ARAVA) 20 MG tablet Take 20 mg by mouth daily.    Marland Kitchen levothyroxine (SYNTHROID, LEVOTHROID) 25 MCG tablet Take 1 tablet (25 mcg total) by mouth daily before breakfast. 90 tablet 3  . LINZESS 145 MCG CAPS capsule Take 145 mcg by mouth daily as needed.     . Magnesium 250 MG TABS Take 250 mg by mouth daily.    . ondansetron (ZOFRAN) 4 MG tablet Take 1 tablet (4 mg total) by mouth every 8 (eight) hours as needed for nausea or vomiting. 6 tablet 0  . valACYclovir (VALTREX) 1000 MG tablet Take 1 tablet (1,000 mg total) by mouth 2 (two) times daily. (Patient not taking: Reported on 11/08/2018) 60 tablet 3  . VESICARE 10 MG tablet Take 1 tablet (10 mg total) by mouth daily. 90 tablet 3   No current facility-administered medications for this visit.     Allergies as of 11/09/2018  . (No Known Allergies)    Vitals: There were no vitals taken for this visit. Last Weight:  Wt Readings from Last 1 Encounters:  11/12/18 234 lb (106.1 kg)   Last Height:   Ht Readings from Last 1 Encounters:  11/12/18  (1.702 m)     Physical exam: Exam:    Physical exam: Exam: Gen: NAD, conversant      CV: attempted, Could not perform over Web Video  Eyes: Conjunctivae clear without exudates or hemorrhage  Neuro: Detailed Neurologic Exam  Speech:    Speech is normal; fluent and spontaneous with normal comprehension.  Cognition:    The patient is oriented to person, place, and time;     recent and remote memory intact;     language fluent;     normal attention, concentration,     fund of knowledge Cranial Nerves:    The pupils are equal, round, and reactive to light. Attempted, Cannot perform fundoscopic exam. Visual fields are full to finger  confrontation. Extraocular movements are intact.  The face is symmetric with normal sensation. The palate elevates in the  midline. Hearing intact. Voice is normal. Shoulder shrug is normal. The tongue has normal motion without fasciculations.   Coordination:    Normal finger to nose  Gait:    Normal native gait  Motor Observation:   no involuntary movements noted. Tone:    Appears normal  Posture:    Posture is normal. normal erect    Strength:    Strength is anti-gravity and symmetric in the upper and lower limbs.      Sensation: intact to LT     Reflex Exam:  DTR's:    Attempted, Could not perform over Web Video   Toes: Attempted Could not perform over Web Video  Clonus:   Attempted, Could not perform over Web Video     Assessment/Plan:  43 year old with chronic headaches, confusion, memory loss. MRI of the brain wo contrast showed a right middle frontal gyrus and posterior body corpus callosum lesions not consistent with nonspecific white matter changes. She needs to be evaluated for MS or other demyelinating disorder as well as vasculitis given her autoimmune disorders.  MRI of the brain: mri brain w/wo contrast, labs, CTA of the head and neck.   [facility]  ldl 116 - recommend follow up with pcp, goal <100  Extensive lab testing for vasculitic causes, hypercoag state.  May need Lumbar Puncture after workup above  Migraines:  Preventative for migraines. Start Aimovig.  Memory changes:see above. Formal Memory testing, Dr. Kieth Brightlyodenbough after workup if warranted.  Enbrel - She is on Enbrel, unclear if this medication can cause white matter lesions or memory loss, but she has been on it for several years so does not coincide with symptoms. Will perform workup before any changes recommended.  Sleeping issues, morning headaches: Sleep evaluation - on the back burner, discussed OSA but will hold off for now. Also discussed good sleep hygiene.   B12 245, will check MMA  Vit D Deficiency: replete per Dr. Clair Gullingtawford  Anxiety:  Discussed taking care of  herself, stress in her life.Engaging in exercise, meditation, weight loss and focusing more on her health. Continue Lexapro.    Follow Up Instructions:    I discussed the assessment and treatment plan with the patient. The patient was provided an opportunity to ask questions and all were answered. The patient agreed with the plan and demonstrated an understanding of the instructions.   The patient was advised to call back or seek an in-person evaluation if the symptoms worsen or if the condition fails to improve as anticipated.  A total of 90 minutes was spent ideo face-to-face(not in person) with this patient. Over half this time was spent on counseling patient on the  1. Chronic migraine without aura without status migrainosus, not intractable   2. Cognitive changes   3. Hx of autoimmune disorder   4. Arteritis (HCC)   5. Multifocal distribution of white matter abnormalities present on MRI   6. Encephalopathy   7. Intractable vascular headache   8. Positional headache   9. Vasculitis (HCC)   10. CNS vasculitis (HCC)   11. B12 deficiency    diagnosis and different diagnostic and therapeutic options, counseling and coordination of care, risks ans benefits of management, compliance, or risk factor reduction and education.     Orders Placed This Encounter  Procedures  . MR BRAIN W WO CONTRAST  . CT  ANGIO HEAD W OR WO CONTRAST  . CT ANGIO NECK W OR WO CONTRAST  . Lupus anticoagulant  . Beta-2-glycoprotein i abs, IgG/M/A  . Homocysteine  . Cardiolipin antibodies, IgG, IgM, IgA  . Sickle Cell Screen  . Antithrombin III  . Factor 5 leiden  . Prothrombin gene mutation  . Pan-ANCA  . Protein C activity  . Protein S activity  . Sedimentation rate  . RPR  . HIV Antibody (routine testing w rflx)  . C-reactive protein  . ANA  . B12 and Folate Panel  . Methylmalonic acid, serum   Meds ordered this encounter  Medications  . Erenumab-aooe (AIMOVIG) 140 MG/ML SOAJ    Sig: Inject  140 mg into the skin every 30 (thirty) days.    Dispense:  1 pen    Refill:  11    Patient has copay card; she can have medication for $5 regardless of insurance approval or copay amount.    Cc: Myrlene Broker, *,    Naomie Dean, MD  [facility] [address] [place], [place] [address]  Phone [phone] Fax [phone]

## 2018-11-11 ENCOUNTER — Encounter: Payer: Self-pay | Admitting: Neurology

## 2018-11-12 ENCOUNTER — Encounter (HOSPITAL_COMMUNITY): Payer: Self-pay

## 2018-11-12 ENCOUNTER — Other Ambulatory Visit: Payer: Self-pay

## 2018-11-12 ENCOUNTER — Emergency Department (HOSPITAL_COMMUNITY)
Admission: EM | Admit: 2018-11-12 | Discharge: 2018-11-12 | Disposition: A | Discharge status: Home / Self Care | Payer: No Typology Code available for payment source | Attending: Emergency Medicine | Admitting: Emergency Medicine

## 2018-11-12 ENCOUNTER — Encounter (HOSPITAL_COMMUNITY): Payer: Self-pay | Admitting: Emergency Medicine

## 2018-11-12 ENCOUNTER — Ambulatory Visit (INDEPENDENT_AMBULATORY_CARE_PROVIDER_SITE_OTHER)
Admission: EM | Admit: 2018-11-12 | Discharge: 2018-11-12 | Disposition: A | Discharge status: Home / Self Care | Payer: No Typology Code available for payment source | Source: Home / Self Care

## 2018-11-12 DIAGNOSIS — M069 Rheumatoid arthritis, unspecified: Secondary | ICD-10-CM | POA: Insufficient documentation

## 2018-11-12 DIAGNOSIS — R1013 Epigastric pain: Secondary | ICD-10-CM

## 2018-11-12 DIAGNOSIS — Z79899 Other long term (current) drug therapy: Secondary | ICD-10-CM | POA: Insufficient documentation

## 2018-11-12 DIAGNOSIS — R112 Nausea with vomiting, unspecified: Secondary | ICD-10-CM | POA: Diagnosis not present

## 2018-11-12 DIAGNOSIS — R10815 Periumbilic abdominal tenderness: Secondary | ICD-10-CM | POA: Insufficient documentation

## 2018-11-12 DIAGNOSIS — E039 Hypothyroidism, unspecified: Secondary | ICD-10-CM | POA: Insufficient documentation

## 2018-11-12 DIAGNOSIS — R197 Diarrhea, unspecified: Secondary | ICD-10-CM | POA: Diagnosis not present

## 2018-11-12 DIAGNOSIS — R109 Unspecified abdominal pain: Secondary | ICD-10-CM

## 2018-11-12 LAB — URINALYSIS, ROUTINE W REFLEX MICROSCOPIC
Bacteria, UA: NONE SEEN
Bilirubin Urine: NEGATIVE
Glucose, UA: NEGATIVE mg/dL
Hgb urine dipstick: NEGATIVE
Ketones, ur: 80 mg/dL — AB
Leukocytes,Ua: NEGATIVE
Nitrite: NEGATIVE
Protein, ur: 30 mg/dL — AB
Specific Gravity, Urine: 1.028 (ref 1.005–1.030)
pH: 5 (ref 5.0–8.0)

## 2018-11-12 LAB — COMPREHENSIVE METABOLIC PANEL
ALT: 7 U/L (ref 0–44)
AST: 14 U/L — ABNORMAL LOW (ref 15–41)
Albumin: 3.9 g/dL (ref 3.5–5.0)
Alkaline Phosphatase: 76 U/L (ref 38–126)
Anion gap: 12 (ref 5–15)
BUN: 8 mg/dL (ref 6–20)
CO2: 22 mmol/L (ref 22–32)
Calcium: 9.2 mg/dL (ref 8.9–10.3)
Chloride: 103 mmol/L (ref 98–111)
Creatinine, Ser: 0.73 mg/dL (ref 0.44–1.00)
GFR calc Af Amer: >60 mL/min (ref >60)
GFR calc non Af Amer: >60 mL/min (ref >60)
Glucose, Bld: 94 mg/dL (ref 70–99)
Potassium: 3.3 mmol/L — ABNORMAL LOW (ref 3.5–5.1)
Sodium: 137 mmol/L (ref 135–145)
Total Bilirubin: 1 mg/dL (ref 0.3–1.2)
Total Protein: 8 g/dL (ref 6.5–8.1)

## 2018-11-12 LAB — CBC WITH DIFFERENTIAL/PLATELET
Abs Immature Granulocytes: 0.01 10*3/uL (ref 0.00–0.07)
Basophils Absolute: 0 10*3/uL (ref 0.0–0.1)
Basophils Relative: 0 %
Eosinophils Absolute: 0.1 10*3/uL (ref 0.0–0.5)
Eosinophils Relative: 1 %
HCT: 42.6 % (ref 36.0–46.0)
Hemoglobin: 13.1 g/dL (ref 12.0–15.0)
Immature Granulocytes: 0 %
Lymphocytes Relative: 22 %
Lymphs Abs: 2.1 10*3/uL (ref 0.7–4.0)
MCH: 24.8 pg — ABNORMAL LOW (ref 26.0–34.0)
MCHC: 30.8 g/dL (ref 30.0–36.0)
MCV: 80.7 fL (ref 80.0–100.0)
Monocytes Absolute: 0.7 10*3/uL (ref 0.1–1.0)
Monocytes Relative: 8 %
Neutro Abs: 6.6 10*3/uL (ref 1.7–7.7)
Neutrophils Relative %: 69 %
Platelets: 299 10*3/uL (ref 150–400)
RBC: 5.28 MIL/uL — ABNORMAL HIGH (ref 3.87–5.11)
RDW: 14.1 % (ref 11.5–15.5)
WBC: 9.6 10*3/uL (ref 4.0–10.5)
nRBC: 0 % (ref 0.0–0.2)

## 2018-11-12 LAB — LIPASE, BLOOD: Lipase: 23 U/L (ref 11–51)

## 2018-11-12 MED ORDER — SODIUM CHLORIDE 0.9 % IV BOLUS
1000.0000 mL | Freq: Once | INTRAVENOUS | Status: AC
  Administered 2018-11-12: 1000 mL via INTRAVENOUS

## 2018-11-12 MED ORDER — ONDANSETRON 4 MG PO TBDP
4.0000 mg | ORAL_TABLET | Freq: Once | ORAL | Status: AC
  Administered 2018-11-12: 4 mg via ORAL

## 2018-11-12 MED ORDER — FAMOTIDINE 20 MG PO TABS: 20.0000 mg | ORAL_TABLET | Freq: Two times a day (BID) | ORAL | 0 refills | Status: DC

## 2018-11-12 MED ORDER — FAMOTIDINE 20 MG IN NS 100 ML IVPB
20.0000 mg | Freq: Once | INTRAVENOUS | Status: AC
  Administered 2018-11-12: 20 mg via INTRAVENOUS
  Filled 2018-11-12: qty 100

## 2018-11-12 MED ORDER — DICYCLOMINE HCL 20 MG PO TABS: 20.0000 mg | ORAL_TABLET | Freq: Two times a day (BID) | ORAL | 0 refills | Status: DC

## 2018-11-12 MED ORDER — ONDANSETRON HCL 4 MG PO TABS: 4.0000 mg | ORAL_TABLET | Freq: Three times a day (TID) | ORAL | 0 refills | Status: DC | PRN

## 2018-11-12 MED ORDER — MORPHINE SULFATE (PF) 4 MG/ML IV SOLN
4.0000 mg | Freq: Once | INTRAVENOUS | Status: AC
  Administered 2018-11-12: 4 mg via INTRAVENOUS
  Filled 2018-11-12: qty 1

## 2018-11-12 MED ORDER — DICYCLOMINE HCL 10 MG/ML IM SOLN
20.0000 mg | Freq: Once | INTRAMUSCULAR | Status: AC
  Administered 2018-11-12: 20 mg via INTRAMUSCULAR
  Filled 2018-11-12: qty 2

## 2018-11-12 MED ORDER — ONDANSETRON HCL 4 MG/2ML IJ SOLN
4.0000 mg | Freq: Once | INTRAMUSCULAR | Status: AC
  Administered 2018-11-12: 4 mg via INTRAVENOUS
  Filled 2018-11-12: qty 2

## 2018-11-12 MED ORDER — ALUM & MAG HYDROXIDE-SIMETH 200-200-20 MG/5ML PO SUSP
30.0000 mL | Freq: Once | ORAL | Status: AC
  Administered 2018-11-12: 17:00:00 30 mL via ORAL
  Filled 2018-11-12: qty 30

## 2018-11-12 NOTE — ED Provider Notes (Signed)
[facility]   [id number] 11/12/18 Arrival Time: 1427  CC: ABDOMINAL DISCOMFORT  SUBJECTIVE:  Maria Wiley is a 43 y.o. female hx significant for ankylosing spondylitis, GERD, RA, and thyroid disease, who presents with complaint of nausea, vomiting x 5 episodes, watery diarrhea x >10 episodes and abdominal discomfort x 36 hours.  Denies a precipitating event, trauma, close contacts with similar symptoms, recent travel or antibiotic use.  Localizes pain to epigastric region.  Describes as intermittent, worsening, and sharp in character.  Has not tried OTC medications.  Has been able to take sips of liquids.  States symptoms made worse with driving in the car, with sitting up, laying down, and using the restroom.  Denies similar symptoms in the past.  Last BM couple of hours ago.  Complains of chills, sweats, decreased appetite, and fatigue.  Denies fever, chest pain, SOB, constipation, hematochezia, melena, dysuria, difficulty urinating, increased frequency or urgency, flank pain, loss of bowel or bladder function, vaginal discharge, vaginal odor, vaginal bleeding, dyspareunia, pelvic pain.     No LMP recorded. Patient has had a hysterectomy.  Hx also significant for cholecystectomy, appendectomy.    ROS: As per HPI.  Past Medical History:  Diagnosis Date  . Ankylosing spondylitis (HCC)   . Chest pain   . GERD (gastroesophageal reflux disease)   . Migraines   . RA (rheumatoid arthritis) (HCC)   . Thyroid disease   . Urinary incontinence    Past Surgical History:  Procedure Laterality Date  . ABDOMINAL HYSTERECTOMY    . APPENDECTOMY    . CHOLECYSTECTOMY    . HERNIA REPAIR  2004  . KNEE SURGERY Right 2006   No Known Allergies No current facility-administered medications on file prior to encounter.    Current Outpatient Medications on File Prior to Encounter  Medication Sig Dispense Refill  . cholecalciferol (VITAMIN D) 1000 units tablet Take 1,000 Units by  mouth daily.    . Diclofenac Sodium (PENNSAID) 2 % SOLN Place 1 application onto the skin 2 (two) times daily. (Patient taking differently: Place 1 application onto the skin 2 (two) times daily as needed. ) 1 Bottle 3  . doxycycline (VIBRAMYCIN) 100 MG capsule Take 100 mg by mouth 2 (two) times daily.   3  . Erenumab-aooe (AIMOVIG) 140 MG/ML SOAJ Inject 140 mg into the skin every 30 (thirty) days. 1 pen 11  . escitalopram (LEXAPRO) 20 MG tablet TAKE 1/2 TABLETS BY MOUTH ONCE A DAY 45 tablet 0  . etanercept (ENBREL SURECLICK) 50 MG/ML injection Inject 0.98 mLs (50 mg total) into the skin once a week. 4 Syringe 1  . folic acid (FOLVITE) 1 MG tablet Take 1 tablet (1 mg total) by mouth daily. 90 tablet 3  . leflunomide (ARAVA) 20 MG tablet Take 20 mg by mouth daily.    Marland Kitchen. levothyroxine (SYNTHROID, LEVOTHROID) 25 MCG tablet Take 1 tablet (25 mcg total) by mouth daily before breakfast. 90 tablet 3  . LINZESS 145 MCG CAPS capsule Take 145 mcg by mouth daily as needed.     . Magnesium 250 MG TABS Take 250 mg by mouth daily.    . valACYclovir (VALTREX) 1000 MG tablet Take 1 tablet (1,000 mg total) by mouth 2 (two) times daily. (Patient not taking: Reported on 11/08/2018) 60 tablet 3  . VESICARE 10 MG tablet Take 1 tablet (10 mg total) by mouth daily. 90 tablet 3   Social History   Socioeconomic History  . Marital status: Married  Spouse name: Not on file  . Number of children: 4  . Years of education: Not on file  . Highest education level: Bachelor's degree (e.g., BA, AB, BS)  Occupational History  . Not on file  Social Needs  . Financial resource strain: Not on file  . Food insecurity:    Worry: Not on file    Inability: Not on file  . Transportation needs:    Medical: Not on file    Non-medical: Not on file  Tobacco Use  . Smoking status: Never Smoker  . Smokeless tobacco: Never Used  Substance and Sexual Activity  . Alcohol use: Never    Frequency: Never  . Drug use: Never  . Sexual  activity: Yes    Birth control/protection: Surgical  Lifestyle  . Physical activity:    Days per week: Not on file    Minutes per session: Not on file  . Stress: Not on file  Relationships  . Social connections:    Talks on phone: Not on file    Gets together: Not on file    Attends religious service: Not on file    Active member of club or organization: Not on file    Attends meetings of clubs or organizations: Not on file    Relationship status: Not on file  . Intimate partner violence:    Fear of current or ex partner: Not on file    Emotionally abused: Not on file    Physically abused: Not on file    Forced sexual activity: Not on file  Other Topics Concern  . Not on file  Social History Narrative   Lives at home with her husband & her 4 sons   Right handed   Caffeine: "maybe 2" cups per day   Family History  Problem Relation Age of Onset  . CAD Maternal Grandfather   . Diabetes Maternal Grandfather   . Hearing loss Maternal Grandfather   . Heart disease Maternal Grandfather   . Hyperlipidemia Maternal Grandfather   . Hypertension Mother   . Arthritis Mother   . Arthritis Maternal Grandmother   . Heart disease Maternal Grandmother   . Hyperlipidemia Maternal Grandmother   . Migraines Neg Hx   . Headache Neg Hx      OBJECTIVE:  Vitals:   11/12/18 1451  BP: 135/82  Pulse: 97  Resp: 17  Temp: 98.2 F (36.8 C)  TempSrc: Oral  SpO2: 100%    General appearance: Alert; appears uncomfortable, laying on exam table upon entering the room HEENT: NCAT.  Oropharynx clear. PERRL.  EOMI.  Nares patent.  Oropharynx clear Lungs: clear to auscultation bilaterally without adventitious breath sounds Heart: regular rate and rhythm.  Radial pulses 2+ symmetrical bilaterally Abdomen: soft, non-distended; hypoactive bowel sounds; diffusely TTP over the abdomen, especially over the epigastric region; nontender at McBurney's point; negative Murphy's sign; negative rebound;  minimal guarding Back: no CVA tenderness Extremities: no edema; symmetrical with no gross deformities Skin: warm and dry Neurologic: normal gait Psychological: alert and cooperative; normal mood and affect  ASSESSMENT & PLAN:  1. Nausea vomiting and diarrhea   2. Epigastric pain     Meds ordered this encounter  Medications  . ondansetron (ZOFRAN-ODT) disintegrating tablet 4 mg   Zofran given in office.   Recommending further evaluation and management of abdominal pain, nausea, vomiting, and diarrhea.  Patient aware and in agreement with this plan.  Will go with husband by private vehicle to [facility].  Rennis HardingWurst, Estill Llerena, PA-C 11/12/18 1523

## 2018-11-12 NOTE — Discharge Instructions (Signed)
Zofran given in office.   Recommending further evaluation and management of abdominal pain, nausea, vomiting, and diarrhea in the ED.  Patient aware and in agreement with this plan.  Will go with husband by private vehicle to [facility].

## 2018-11-12 NOTE — Discharge Instructions (Addendum)

## 2018-11-12 NOTE — ED Provider Notes (Signed)
[facility] EMERGENCY DEPARTMENT Provider Note   CSN: [id number] Arrival date & time: 11/12/18  1528    History   Chief Complaint Chief Complaint  Patient presents with  . Abdominal Pain    HPI Maria Wiley is a 43 y.o. female.     HPI   Pt is a 43 y/o female wit ha h/o ankylosing spondylitis, GERD, rheumatoid arthritis, thyroid disease, cholecystectomy, appendectomy, and hysterectomy, who presents to the ED today c/o epigastric abd pain that woke her p from sleep yesterday morning. States that pain is sharp and is waxing and waning. Rates pain 5-6/10. Pain is also associated with NVD. She has had countless episodes of both since sxs started.   No bloody stools or hematemesis. No fevers. No urinary or vaginal complaints. No URI sxs.   States she went to [facility] and was sent here for further evaluation. States that whenever she hit a bump on the road to UC her pain was exacerbated. No recent abx use. Denies drug or ETOH use. No heavy NSAID use.   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 She denies any known sick contacts.   Past Medical History:  Diagnosis Date  . Ankylosing spondylitis (HCC)   . Chest pain   . GERD (gastroesophageal reflux disease)   . Migraines   . RA (rheumatoid arthritis) (HCC)   . Thyroid disease   . Urinary incontinence     Patient Active Problem List   Diagnosis Date Noted  . Weakness 10/11/2018  . New daily persistent headache 10/11/2018  . Episode of change in speech 10/11/2018  . Left flank pain 06/20/2018  . Acute sinus infection 05/12/2018  . Hair loss 02/25/2018  . Ankylosing spondylitis (HCC) 01/15/2018  . OA (osteoarthritis) of knee 01/15/2018  . Routine general medical examination at a health care facility 01/15/2018  . IBS (irritable bowel syndrome) 01/15/2018  . RA (rheumatoid arthritis) (HCC)   . Hypothyroidism     Past Surgical History:  Procedure Laterality Date  .  ABDOMINAL HYSTERECTOMY    . APPENDECTOMY    . CHOLECYSTECTOMY    . HERNIA REPAIR  2004  . KNEE SURGERY Right 2006     OB History   No obstetric history on file.      Home Medications    Prior to Admission medications   Medication Sig Start Date End Date Taking? Authorizing Provider  cholecalciferol (VITAMIN D) 1000 units tablet Take 1,000 Units by mouth daily.    [provider]  Diclofenac Sodium (PENNSAID) 2 % SOLN Place 1 application onto the skin 2 (two) times daily. Patient taking differently: Place 1 application onto the skin 2 (two) times daily as needed.  01/20/18   Myra Rude, MD  dicyclomine (BENTYL) 20 MG tablet Take 1 tablet (20 mg total) by mouth 2 (two) times daily for 5 days. 11/12/18 11/17/18  Jarissa Sheriff S, PA-C  doxycycline (VIBRAMYCIN) 100 MG capsule Take 100 mg by mouth 2 (two) times daily.  06/10/18   [provider]  Erenumab-aooe (AIMOVIG) 140 MG/ML SOAJ Inject 140 mg into the skin every 30 (thirty) days. 11/09/18   Anson Fret, MD  escitalopram (LEXAPRO) 20 MG tablet TAKE 1/2 TABLETS BY MOUTH ONCE A DAY 11/08/18   Myrlene Broker, MD  etanercept (ENBREL SURECLICK) 50 MG/ML injection Inject 0.98 mLs (50 mg total) into the skin once a week. 09/30/18   Quentin Angst, MD  famotidine (PEPCID) 20 MG tablet Take 1 tablet (20 mg total) by mouth 2 (two) times daily for 5 days. 11/12/18 11/17/18  Any Mcneice S, PA-C  folic acid (FOLVITE) 1 MG tablet Take 1 tablet (1 mg total) by mouth daily. 01/14/18   Myrlene Brokerrawford, Elizabeth A, MD  leflunomide (ARAVA) 20 MG tablet Take 20 mg by mouth daily.    [provider]  levothyroxine (SYNTHROID, LEVOTHROID) 25 MCG tablet Take 1 tablet (25 mcg total) by mouth daily before breakfast. 05/09/18   Myrlene Brokerrawford, Elizabeth A, MD  LINZESS 145 MCG CAPS capsule Take 145 mcg by mouth daily as needed.  07/25/17   [provider]  Magnesium 250 MG TABS Take 250 mg by mouth daily.    [provider]  ondansetron (ZOFRAN) 4 MG tablet Take 1 tablet (4 mg total) by mouth every 8 (eight) hours as needed for nausea or vomiting. 11/12/18   Loza Prell S, PA-C  valACYclovir (VALTREX) 1000 MG tablet Take 1 tablet (1,000 mg total) by mouth 2 (two) times daily. Patient not taking: Reported on 11/08/2018 01/14/18   Myrlene Brokerrawford, Elizabeth A, MD  VESICARE 10 MG tablet Take 1 tablet (10 mg total) by mouth daily. 05/09/18   Myrlene Brokerrawford, Elizabeth A, MD    Family History Family History  Problem Relation Age of Onset  . CAD Maternal Grandfather   . Diabetes Maternal Grandfather   . Hearing loss Maternal Grandfather   . Heart disease Maternal Grandfather   . Hyperlipidemia Maternal Grandfather   . Hypertension Mother   . Arthritis Mother   . Arthritis Maternal Grandmother   . Heart disease Maternal Grandmother   . Hyperlipidemia Maternal Grandmother   . Migraines Neg Hx   . Headache Neg Hx     Social History Social History   Tobacco Use  . Smoking status: Never Smoker  . Smokeless tobacco: Never Used  Substance Use Topics  . Alcohol use: Never    Frequency: Never  . Drug use: Never     Allergies   Patient has no known allergies.   Review of Systems Review of Systems  Constitutional: Positive for fatigue. Negative for fever.  HENT: Negative for ear pain and sore throat.   Eyes: Negative for visual disturbance.  Respiratory: Negative for cough and shortness of breath.   Cardiovascular: Negative for chest pain.  Gastrointestinal: Positive for abdominal pain, diarrhea, nausea and vomiting. Negative for blood in stool and constipation.  Genitourinary: Negative for dysuria, frequency, hematuria, urgency, vaginal bleeding and vaginal discharge.  Musculoskeletal: Negative for back pain.  Skin: Negative for color change and rash.  Neurological: Positive for weakness (generalized). Negative for headaches.  All other systems reviewed and are negative.   Physical Exam Updated  Vital Signs BP (!) 129/92   Pulse 79   Temp 98.6 F (37 C) (Oral)   Resp 19   Ht 5\' 7"  (1.702 m)   Wt 106.1 kg   SpO2 100%   BMI 36.65 kg/m   Physical Exam Vitals signs and nursing note reviewed.  Constitutional:      General: She is not in acute distress.    Appearance: She is well-developed. She is not ill-appearing or toxic-appearing.  HENT:     Head: Normocephalic and atraumatic.     Mouth/Throat:     Comments: Dry mucous membranes Eyes:     Conjunctiva/sclera: Conjunctivae normal.  Neck:     Musculoskeletal: Neck supple.  Cardiovascular:     Rate and Rhythm: Normal rate  and regular rhythm.     Heart sounds: Normal heart sounds. No murmur.  Pulmonary:     Effort: Pulmonary effort is normal. No respiratory distress.     Breath sounds: Normal breath sounds. No stridor. No wheezing, rhonchi or rales.  Abdominal:     General: Bowel sounds are normal.     Palpations: Abdomen is soft.     Tenderness: There is abdominal tenderness in the epigastric area and periumbilical area. There is no right CVA tenderness, left CVA tenderness, guarding or rebound.  Skin:    General: Skin is warm and dry.  Neurological:     Mental Status: She is alert.      ED Treatments / Results  Labs (all labs ordered are listed, but only abnormal results are displayed) Labs Reviewed  CBC WITH DIFFERENTIAL/PLATELET - Abnormal; Notable for the following components:      Result Value   RBC 5.28 (*)    MCH 24.8 (*)    All other components within normal limits  COMPREHENSIVE METABOLIC PANEL - Abnormal; Notable for the following components:   Potassium 3.3 (*)    AST 14 (*)    All other components within normal limits  URINALYSIS, ROUTINE W REFLEX MICROSCOPIC - Abnormal; Notable for the following components:   Color, Urine AMBER (*)    APPearance HAZY (*)    Ketones, ur 80 (*)    Protein, ur 30 (*)    All other components within normal limits  LIPASE, BLOOD    EKG None  Radiology No  results found.  Procedures Procedures (including critical care time)  Medications Ordered in ED Medications  famotidine (PEPCID) IVPB 20 mg in NS 100 mL IVPB (0 mg Intravenous Stopped 11/12/18 1816)  ondansetron (ZOFRAN) injection 4 mg (4 mg Intravenous Given 11/12/18 1641)  alum & mag hydroxide-simeth (MAALOX/MYLANTA) 200-200-20 MG/5ML suspension 30 mL (30 mLs Oral Given 11/12/18 1649)  sodium chloride 0.9 % bolus 1,000 mL (0 mLs Intravenous Stopped 11/12/18 1800)  morphine 4 MG/ML injection 4 mg (4 mg Intravenous Given 11/12/18 1838)  dicyclomine (BENTYL) injection 20 mg (20 mg Intramuscular Given 11/12/18 1835)     Initial Impression / Assessment and Plan / ED Course  I have reviewed the triage vital signs and the nursing notes.  Pertinent labs & imaging results that were available during my care of the patient were reviewed by me and considered in my medical decision making (see chart for details).     Final Clinical Impressions(s) / ED Diagnoses   Final diagnoses:  Abdominal pain, unspecified abdominal location  Nausea vomiting and diarrhea   Patient presenting with nausea vomiting diarrhea and abdominal pain that began yesterday morning.  No fevers or chills.  No urinary symptoms.  Patient's vital signs are within normal limits.  Initial exam with some epigastric and periumbilical abdominal tenderness.  Normal bowel sounds.  No rigidity rebound or guarding on exam.  Will initiate work-up with labs.  Will give medications and reassess.  Reassessed patient.  She is in no distress.  States nausea has improved and she has had no further episodes of vomiting.  She is complaining of some periumbilical and epigastric abdominal pain still.  She does have minimal tenderness on exam however she does not have any guarding, rebound or rigidity on exam.  She is able to tolerate p.o.  I discussed the laboratory work with patient and discussed that this is reassuring.  She has no leukocytosis.  Her  electrolytes are normal.  Her kidney and liver function are normal.  Her lipase is normal.  Her UA is negative.  I doubt bowel obstruction, perforated viscus, or other acute abdominal etiology that would require further work-up or imaging.  I feel her symptoms are more likely rated to viral gastroenteritis and she may also have a component of gastritis.  I discussed plan for discharge with Rx for Zofran, Pepcid and Bentyl.  She is comfortable with the plan for discharge without imaging.  I discussed the importance of monitoring her symptoms and advised that if her symptoms were to persist, worsen, or she were to develop any new symptoms then she should return to the emergency department immediately.  Advise follow-up with her PCP next week.  She voiced understanding of the plan and reasons to return to the ED.  All questions answered.  Patient stable for discharge.  ED Discharge Orders         Ordered    dicyclomine (BENTYL) 20 MG tablet  2 times daily     11/12/18 1921    famotidine (PEPCID) 20 MG tablet  2 times daily     11/12/18 1921    ondansetron (ZOFRAN) 4 MG tablet  Every 8 hours PRN     11/12/18 1921           Karrie MeresCouture, Lylliana Kitamura S, PA-C 11/12/18 1946    Little, Ambrose Finlandachel Morgan, MD 11/12/18 2005

## 2018-11-12 NOTE — ED Triage Notes (Signed)
Patient presents to Urgent Care with complaints of middle abdominal pain, nausea, vomiting, and diarrhea since yesterday morning. Patient states she has not been able to keep food down today, had 3 episodes of diarrhea today, 2 episodes of vomiting today.

## 2018-11-12 NOTE — ED Triage Notes (Signed)
Pt reports abd pain, N/V/D since Friday at 3:30 am. Pt endorses chills. Zofran given at Urgent Care with no relief.

## 2018-11-15 ENCOUNTER — Encounter: Payer: Self-pay | Admitting: Internal Medicine

## 2018-11-15 ENCOUNTER — Telehealth: Payer: Self-pay

## 2018-11-15 ENCOUNTER — Telehealth: Payer: Self-pay | Admitting: Neurology

## 2018-11-15 ENCOUNTER — Ambulatory Visit (INDEPENDENT_AMBULATORY_CARE_PROVIDER_SITE_OTHER): Payer: No Typology Code available for payment source | Admitting: Internal Medicine

## 2018-11-15 DIAGNOSIS — R197 Diarrhea, unspecified: Secondary | ICD-10-CM | POA: Diagnosis not present

## 2018-11-15 DIAGNOSIS — R1013 Epigastric pain: Secondary | ICD-10-CM | POA: Diagnosis not present

## 2018-11-15 DIAGNOSIS — R112 Nausea with vomiting, unspecified: Secondary | ICD-10-CM | POA: Diagnosis not present

## 2018-11-15 DIAGNOSIS — R5383 Other fatigue: Secondary | ICD-10-CM | POA: Diagnosis not present

## 2018-11-15 NOTE — Telephone Encounter (Signed)
[facility] order sent to [facility]  it has to be scheduled 1st before i can do the auth per [facility] order sent to [facility]. They will reach out to the pt to schedule.

## 2018-11-15 NOTE — Telephone Encounter (Signed)
Pending approval for Aimovig 140 mg/mL Key: [id number] Rx #: [id number] ICD 10 code: G43.709  I will update once a decision has been made

## 2018-11-15 NOTE — Progress Notes (Signed)
Virtual Visit via Video Note  I connected with Maria Wiley on 11/15/18 at  3:40 PM EDT by a video enabled telemedicine application and verified that I am speaking with the correct person using two identifiers.   I discussed the limitations of evaluation and management by telemedicine and the availability of in person appointments. The patient expressed understanding and agreed to proceed.  History of Present Illness: The patient is a 43 y.o. female with visit for ER follow up (in for N/V/D with labs and U/A not indicative of any infection, pepcid and zofran given to help with nausea). She has been taking the pepcid and bentyl whch has helped some. She has used the zofran as needed but as not been nauseous for the last 2 days so has not used zofran. Not eating much yet but trying to drink liquids. The pain is fading some. She is still having some liquid diarrhea after eating. Maybe 3-4 times per day. Started about last Friday around 3 AM. The pain is located in the top of the stomach in the middle. She has had prior gallbladder and appendix surgically removed. Has some new fatigue and body aches. She is worried about this. Has had some chills but no fevers when she checks temp. Denies cough or SOB. Overall it is worsening fatigue but stomach symptoms are improving. Has tried pepcid and bentyl.   Observations/Objective: Appearance: ill, breathing appears normal, casual grooming, abdomen does not appear distended, throat normal, mental status is A and O times 3  Assessment and Plan: See problem oriented charting  Follow Up Instructions: push fluids, monitor symptoms and temperature, use pepcid and bentyl as needed, self-isolate in case of developing coronavirus, start probiotic  I discussed the assessment and treatment plan with the patient. The patient was provided an opportunity to ask questions and all were answered. The patient agreed with the plan and demonstrated an understanding of the  instructions.   The patient was advised to call back or seek an in-person evaluation if the symptoms worsen or if the condition fails to improve as anticipated.  Myrlene Broker, MD

## 2018-11-16 ENCOUNTER — Encounter: Payer: Self-pay | Admitting: Internal Medicine

## 2018-11-16 DIAGNOSIS — R5383 Other fatigue: Secondary | ICD-10-CM | POA: Insufficient documentation

## 2018-11-16 DIAGNOSIS — R197 Diarrhea, unspecified: Secondary | ICD-10-CM

## 2018-11-16 DIAGNOSIS — R112 Nausea with vomiting, unspecified: Secondary | ICD-10-CM | POA: Insufficient documentation

## 2018-11-16 DIAGNOSIS — R109 Unspecified abdominal pain: Secondary | ICD-10-CM | POA: Insufficient documentation

## 2018-11-16 NOTE — Assessment & Plan Note (Signed)
Suspect some gastritis from gastroenteritis. Pepcid is adequate and symptoms are resolving. Do not suspect diverticulitis or other acute need that would require more labs or imaging.

## 2018-11-16 NOTE — Assessment & Plan Note (Signed)
Could be beginning of coronavirus with her vague GI symptoms and her new chills and fatigue. She is advised to self-isolate until feeling well then wait 3 more days.

## 2018-11-16 NOTE — Assessment & Plan Note (Signed)
Likely a gastroenteritis but potential for GI symptoms which could be coronavirus. She is advised to isolate until feeling well and then wait 3 more days to leave house. Monitor for cough or SOB. Monitor temperature. Advised to start probiotic to help with diarrhea. BRAT diet discussed to use for diarrhea.

## 2018-11-17 ENCOUNTER — Other Ambulatory Visit: Payer: Self-pay | Admitting: Neurology

## 2018-11-17 ENCOUNTER — Telehealth: Payer: Self-pay | Admitting: Neurology

## 2018-11-17 ENCOUNTER — Encounter: Payer: Self-pay | Admitting: Internal Medicine

## 2018-11-17 NOTE — Telephone Encounter (Signed)
Received a approval letter from [employer] for Aimovig. Maximum of 6 refills from 11/12/2018 through 05/13/2019. Approval letter has been faxed to the patient's pharmacy. Confirmation fax has been received.

## 2018-11-17 NOTE — Telephone Encounter (Signed)
Order labs.

## 2018-11-17 NOTE — Telephone Encounter (Signed)
Patient is scheduled at GI for 11/25/18.Marland Kitchen [facility] is pending I faxed clinical notes.

## 2018-11-20 NOTE — Addendum Note (Signed)
Addended by: Naomie Dean B on: 11/20/2018 06:06 PM   Modules accepted: Orders

## 2018-11-20 NOTE — Addendum Note (Signed)
Addended by: Naomie Dean B on: 11/20/2018 06:05 PM   Modules accepted: Orders

## 2018-11-20 NOTE — Telephone Encounter (Signed)
Maria Wiley, let patient know I ordered labs for her as we discussed at our appointment, after reviewing the labs I received from [facility]. She can come in from 8-111 M-Thursday to our office, as you know. Thanks.

## 2018-11-21 NOTE — Telephone Encounter (Signed)
Spoke with patient and let her know that Dr. Lucia Gaskins reviewed the labs from [facility] and then ordered labs. Advised pt we are open for labs Mon-Thurs 8AM-12PM. Pt verbalized understanding and stated she would probably come by the office tomorrow.

## 2018-11-22 ENCOUNTER — Other Ambulatory Visit (INDEPENDENT_AMBULATORY_CARE_PROVIDER_SITE_OTHER): Payer: Self-pay

## 2018-11-22 ENCOUNTER — Other Ambulatory Visit: Payer: Self-pay

## 2018-11-22 DIAGNOSIS — E538 Deficiency of other specified B group vitamins: Secondary | ICD-10-CM

## 2018-11-22 DIAGNOSIS — Z0289 Encounter for other administrative examinations: Secondary | ICD-10-CM

## 2018-11-22 DIAGNOSIS — I776 Arteritis, unspecified: Secondary | ICD-10-CM

## 2018-11-22 DIAGNOSIS — R4189 Other symptoms and signs involving cognitive functions and awareness: Secondary | ICD-10-CM

## 2018-11-22 DIAGNOSIS — Z862 Personal history of diseases of the blood and blood-forming organs and certain disorders involving the immune mechanism: Secondary | ICD-10-CM

## 2018-11-22 DIAGNOSIS — R9089 Other abnormal findings on diagnostic imaging of central nervous system: Secondary | ICD-10-CM

## 2018-11-22 DIAGNOSIS — G441 Vascular headache, not elsewhere classified: Secondary | ICD-10-CM

## 2018-11-22 NOTE — Telephone Encounter (Signed)
[facility] auth: [id number] for the MRI Brain, CT Angio Head & CT Angio Neck. (exp. 12/27/18).

## 2018-11-28 ENCOUNTER — Ambulatory Visit
Admission: RE | Admit: 2018-11-28 | Discharge: 2018-11-28 | Disposition: A | Discharge status: Home / Self Care | Payer: No Typology Code available for payment source | Source: Ambulatory Visit | Attending: Neurology | Admitting: Neurology

## 2018-11-28 ENCOUNTER — Other Ambulatory Visit: Payer: Self-pay

## 2018-11-28 DIAGNOSIS — G43709 Chronic migraine without aura, not intractable, without status migrainosus: Secondary | ICD-10-CM

## 2018-11-28 DIAGNOSIS — I776 Arteritis, unspecified: Secondary | ICD-10-CM

## 2018-11-28 DIAGNOSIS — Z862 Personal history of diseases of the blood and blood-forming organs and certain disorders involving the immune mechanism: Secondary | ICD-10-CM

## 2018-11-28 DIAGNOSIS — G9340 Encephalopathy, unspecified: Secondary | ICD-10-CM

## 2018-11-28 DIAGNOSIS — R9089 Other abnormal findings on diagnostic imaging of central nervous system: Secondary | ICD-10-CM

## 2018-11-28 DIAGNOSIS — R51 Headache with orthostatic component, not elsewhere classified: Secondary | ICD-10-CM

## 2018-11-28 DIAGNOSIS — R4189 Other symptoms and signs involving cognitive functions and awareness: Secondary | ICD-10-CM

## 2018-11-28 DIAGNOSIS — G441 Vascular headache, not elsewhere classified: Secondary | ICD-10-CM

## 2018-11-28 MED ORDER — IOPAMIDOL (ISOVUE-300) INJECTION 61%
100.0000 mL | Freq: Once | INTRAVENOUS | Status: AC | PRN
  Administered 2018-11-28: 100 mL via INTRAVENOUS

## 2018-11-28 MED ORDER — GADOBENATE DIMEGLUMINE 529 MG/ML IV SOLN
20.0000 mL | Freq: Once | INTRAVENOUS | Status: AC | PRN
  Administered 2018-11-28: 20 mL via INTRAVENOUS

## 2018-11-29 ENCOUNTER — Telehealth: Payer: Self-pay | Admitting: Neurology

## 2018-11-29 DIAGNOSIS — R4189 Other symptoms and signs involving cognitive functions and awareness: Secondary | ICD-10-CM

## 2018-11-29 DIAGNOSIS — R9089 Other abnormal findings on diagnostic imaging of central nervous system: Secondary | ICD-10-CM

## 2018-11-29 NOTE — Telephone Encounter (Signed)
Maria Wiley, Call patient to discuss: Patients labs are all returning unremarkable. Her blood vessels were all normal, no signs of vasculitis or inflammation. The repeat MRI of the brain showed no changes to the white spot on her brain that we have been discussing. This is what I think the plan should be:  1. She is having many perceived cognitive issues. Since the workup has not revealed any causes, I recommend formal memory evaluation/testing with Dr. Doreene Nest. We did discuss this at our appointment and it is an excellent and thorough evaluation with extensive testing.  2. Follow up in the office in 6 months. We should repeat the MRI of the brain at that time to make sure that white spot hasn't changed. If Dr. Kieth Brightly finds anything neurologic causing her symptoms then we will see her back sooner. Its her responsibility to make sure she comes back to the office in 6 months so we can order another MRI, make sure she understands she will need a repeat MRI and its her responsibility to f/u with [place].  Thanks

## 2018-11-29 NOTE — Telephone Encounter (Signed)
Spoke with patient and discussed the message below from Dr. Lucia Gaskins (labs unremarkable, blood vessels normal, no signs of vasculitis or inflammation, MRI brain stable white spot, no causes for cognitive issues found in testing). Discussed plan in detail per Dr. Lucia Gaskins. Pt is agreeable to the testing with Dr. Kieth Brightly and went ahead and scheduled 6 mo f/u. Pt understands it is her responsibility to f/u and if we need to see her soon based on Dr. Marvetta Gibbons results we certainly can. Pt verbalized appreciation for the call and was given the number to Dr. Marvetta Gibbons office if needed.   F/u with Dr.Ahern: Wed 05/10/2019 @ 08:30 AM Referral placed for Dr. Kieth Brightly

## 2018-11-29 NOTE — Addendum Note (Signed)
Addended by: Bertram Savin on: 11/29/2018 02:56 PM   Modules accepted: Orders

## 2018-11-30 LAB — ANTITHROMBIN III: AntiThromb III Func: 106 % (ref 75–135)

## 2018-11-30 LAB — LUPUS ANTICOAGULANT
Dilute Viper Venom Time: 36.7 s (ref 0.0–47.0)
PTT Lupus Anticoagulant: 46.8 s (ref 0.0–51.9)
Thrombin Time: 17.4 s (ref 0.0–23.0)
dPT Confirm Ratio: 0.93 Ratio (ref 0.00–1.40)
dPT: 39.2 s (ref 0.0–55.0)

## 2018-11-30 LAB — PAN-ANCA
ANCA Proteinase 3: <3.5 U/mL (ref 0.0–3.5)
Atypical pANCA: <1:20 {titer}
C-ANCA: <1:20 {titer}
Myeloperoxidase Ab: <9 U/mL (ref 0.0–9.0)
P-ANCA: <1:20 {titer}

## 2018-11-30 LAB — PROTEIN C ACTIVITY: Protein C Activity: 116 % (ref 73–180)

## 2018-11-30 LAB — METHYLMALONIC ACID, SERUM: Methylmalonic Acid: 110 nmol/L (ref 0–378)

## 2018-11-30 LAB — FACTOR 5 LEIDEN

## 2018-11-30 LAB — PROTHROMBIN GENE MUTATION

## 2018-11-30 LAB — C-REACTIVE PROTEIN: CRP: 2 mg/L (ref 0–10)

## 2018-11-30 LAB — RPR: RPR Ser Ql: NONREACTIVE

## 2018-11-30 LAB — HIV ANTIBODY (ROUTINE TESTING W REFLEX): HIV Screen 4th Generation wRfx: NONREACTIVE

## 2018-11-30 LAB — SICKLE CELL SCREEN: Sickle Cell Screen: NEGATIVE

## 2018-11-30 LAB — B12 AND FOLATE PANEL
Folate: >20 ng/mL (ref >3.0)
Vitamin B-12: 358 pg/mL (ref 232–1245)

## 2018-11-30 LAB — ANA: Anti Nuclear Antibody (ANA): NEGATIVE

## 2018-11-30 LAB — PROTEIN S ACTIVITY: Protein S Activity: 100 % (ref 63–140)

## 2018-11-30 LAB — BETA-2-GLYCOPROTEIN I ABS, IGG/M/A
Beta-2 Glyco 1 IgA: <9 GPI IgA units (ref 0–25)
Beta-2 Glyco 1 IgM: <9 GPI IgM units (ref 0–32)
Beta-2 Glyco I IgG: <9 GPI IgG units (ref 0–20)

## 2018-11-30 LAB — SEDIMENTATION RATE: Sed Rate: 36 mm/hr — ABNORMAL HIGH (ref 0–32)

## 2018-11-30 LAB — HOMOCYSTEINE: Homocysteine: 7 umol/L (ref 0.0–14.5)

## 2018-12-07 MED FILL — AIMOVIG 140 MG/ML SOAJ: 140 | 30 days supply | Qty: 1 | Fill #0

## 2018-12-13 MED FILL — SOLIFENACIN SUCCINATE 10 MG: 10 | 30 days supply | Qty: 30 | Fill #1

## 2018-12-13 MED FILL — DOXYCYCLINE HYCLATE 100 MG: 100 | 30 days supply | Qty: 60 | Fill #1

## 2018-12-29 MED FILL — ENBREL SURECLICK 50 MG/ML S: 50 | 28 days supply | Qty: 4 | Fill #0

## 2018-12-29 MED FILL — ESCITALOPRAM 20 MG TABLET: 20 | 90 days supply | Qty: 45 | Fill #0

## 2019-01-03 MED FILL — AIMOVIG 140 MG/ML SOAJ: 140 | 30 days supply | Qty: 1 | Fill #1

## 2019-01-16 ENCOUNTER — Encounter: Payer: Self-pay | Admitting: Internal Medicine

## 2019-01-24 ENCOUNTER — Ambulatory Visit (INDEPENDENT_AMBULATORY_CARE_PROVIDER_SITE_OTHER): Payer: No Typology Code available for payment source | Admitting: Internal Medicine

## 2019-01-24 ENCOUNTER — Encounter: Payer: Self-pay | Admitting: Internal Medicine

## 2019-01-24 DIAGNOSIS — R41840 Attention and concentration deficit: Secondary | ICD-10-CM

## 2019-01-24 NOTE — Progress Notes (Signed)
Virtual Visit via Video Note  I connected with Maria Wiley on 01/24/19 at 11:20 AM EDT by a video enabled telemedicine application and verified that I am speaking with the correct person using two identifiers.  The patient and the provider were at separate locations throughout the entire encounter.   I discussed the limitations of evaluation and management by telemedicine and the availability of in person appointments. The patient expressed understanding and agreed to proceed.  History of Present Illness: The patient is a 43 y.o. female with visit for concerns about ADHD. Started worsening in the last year or so. She has felt extra energy for her whole life and felt like she was always having to do something. She denies having struggles with school. She is struggling some with efficiency but feels she has good time management skills. These are helping but she is struggling more lately. Overall it is worsening. Has tried nothing for this  Observations/Objective: Appearance: normal, breathing appears normal, casual grooming, abdomen does not appear distended, throat normal, mental status is A and O times 3  Assessment and Plan: See problem oriented charting  Follow Up Instructions: refer to [place] attention specialist for evaluation and treatment  I discussed the assessment and treatment plan with the patient. The patient was provided an opportunity to ask questions and all were answered. The patient agreed with the plan and demonstrated an understanding of the instructions.   The patient was advised to call back or seek an in-person evaluation if the symptoms worsen or if the condition fails to improve as anticipated.  Hoyt Koch, MD

## 2019-01-24 NOTE — Assessment & Plan Note (Signed)
Referred and given information about testing and treatment to find out if she has an underlying attention disorder.

## 2019-01-25 MED FILL — LEFLUNOMIDE 20 MG TABLET: 20 | 90 days supply | Qty: 90 | Fill #0

## 2019-01-30 MED FILL — AIMOVIG 140 MG/ML SOAJ: 140 | 30 days supply | Qty: 1 | Fill #2

## 2019-02-09 ENCOUNTER — Ambulatory Visit (INDEPENDENT_AMBULATORY_CARE_PROVIDER_SITE_OTHER): Payer: Self-pay | Admitting: Physician Assistant

## 2019-02-09 ENCOUNTER — Other Ambulatory Visit: Payer: Self-pay

## 2019-02-09 VITALS — BP 142/90 | HR 98 | Temp 98.3°F | Resp 18 | Wt 230.0 lb

## 2019-02-09 DIAGNOSIS — S61011A Laceration without foreign body of right thumb without damage to nail, initial encounter: Secondary | ICD-10-CM

## 2019-02-09 MED FILL — SOLIFENACIN SUCCINATE 10 MG: 10 | 30 days supply | Qty: 30 | Fill #0

## 2019-02-09 MED FILL — LEVOTHYROXINE 25 MCG TABLET: 25 | 90 days supply | Qty: 90 | Fill #0

## 2019-02-09 NOTE — Patient Instructions (Signed)
Thank you for choosing [facility] for your health care needs.  You have been diagnosed with: 1. Laceration of right thumb without complication, initial encounter  May allow water to run over glue. Glue may gradually dissolve or be peeled off in 5-7 days. Do NOT apply antibiotic ointment, will dissolve glue. May take OTC Tylenol or ibuprofen for pain/discomfort.  Follow-up with family physician, urgent care, or ED if wound develops redness, swelling, worsening pain, purulent drainage, bleeding, or if finger develops weakness or tingling sensation.  Tissue Adhesive Wound Care Some cuts and wounds can be closed with skin glue (tissue adhesive). Skin glue holds the skin together and helps your wound heal faster. Skin glue goes away on its own as your wound gets better. Follow these instructions at home:  Wound care  Showers are allowed 24 hours after treatment. Do not soak the wound in water. Do not take baths, swim, or use hot tubs. Do not use soaps or creams on your wound.  If a bandage (dressing) was put on the wound: ? Wash your hands with soap and water before you change your bandage. ? Change the bandage as often as told by your doctor. ? Leave skin glue in place. It will fall off on its own after 7-10 days. ? Keep the bandage dry.  Do not scratch, rub, or pick at the skin glue.  Do not put tape over the skin glue. The skin glue could come off when you take the tape off.  Protect the wound from another injury.  Protect the wound from sun and tanning beds. General instructions  Take over-the-counter and prescription medicines only as told by your doctor.  Keep all follow-up visits as told by your doctor. This is important. Get help right away if:  Your wound is red, puffy (swollen), hot, or tender.  You get a rash after the glue is put on.  You have more pain in the wound.  You have a red streak going away from the wound.  You have yellowish-white fluid (pus) coming  from the wound.  You have more bleeding.  You have a fever.  You have chills and you start to shake.  You notice a bad smell coming from the wound.  Your wound or skin glue breaks open. This information is not intended to replace advice given to you by your health care provider. Make sure you discuss any questions you have with your health care provider. Document Released: 04/28/2008 Document Revised: 07/02/2017 Document Reviewed: 06/12/2016 Elsevier Patient Education  2020 [employer] Laceration Care, Adult A laceration is a cut that may go through all layers of the skin. The cut may also go into the tissue that is right under the skin. Some cuts heal on their own. Others need to be closed with stitches (sutures), staples, skin adhesive strips, or skin glue. Taking care of your injury lowers your risk of infection, helps your injury to heal better, and may prevent scarring. Supplies needed:  Soap.  Water.  Hand sanitizer.  Bandage (dressing).  Antibiotic ointment.  Clean towel. How to take care of your cut Wash your hands with soap and water before touching your wound or changing your bandage. If soap and water are not available, use hand sanitizer. If your doctor used stitches or staples:  Keep the wound clean and dry.  If you were given a bandage, change it at least once a day as told by your doctor. You should also change it if it gets  wet or dirty.  Keep the wound completely dry for the first 24 hours, or as told by your doctor. After that, you may take a shower or a bath. Do not get the wound soaked in water until after the stitches or staples have been removed.  Clean the wound once a day, or as told by your doctor: ? Wash the wound with soap and water. ? Rinse the wound with water to remove all soap. ? Pat the wound dry with a clean towel. Do not rub the wound.  After you clean the wound, put a thin layer of antibiotic ointment on it as told by your doctor. This  ointment: ? Helps to prevent infection. ? Keeps the bandage from sticking to the wound.  Have your stitches or staples removed as told by your doctor. If your doctor used skin adhesive strips:  Keep the wound clean and dry.  If you were given a bandage, you should change it at least once a day as told by your doctor. You should also change it if it gets wet or dirty.  Do not get the skin adhesive strips wet. You can take a shower or a bath, but keep the wound dry.  If the wound gets wet, pat it dry with a clean towel. Do not rub the wound.  Skin adhesive strips fall off on their own. You can trim the strips as the wound heals. Do not remove any strips that are still stuck to the wound. They will fall off after a while. If your doctor used skin glue:  Try to keep your wound dry, but you may briefly wet it in the shower or bath. Do not soak the wound in water, such as by swimming.  After you take a shower or a bath, gently pat the wound dry with a clean towel. Do not rub the wound.  Do not do any activities that will make you really sweaty until the skin glue has fallen off on its own.  Do not apply liquid, cream, or ointment medicine to your wound while the skin glue is still on.  If you were given a bandage, you should change it at least once a day or as told by your doctor. You should also change it if it gets dirty or wet.  If a bandage is placed over the wound, do not let the tape touch the skin glue.  Do not pick at the glue. The skin glue usually stays on for 5-10 days. Then, it falls off the skin. General instructions   Take over-the-counter and prescription medicines only as told by your doctor.  If you were given antibiotic medicine or ointment, take or apply it as told by your doctor. Do not stop using it even if your condition improves.  Do not scratch or pick at the wound.  Check your wound every day for signs of infection. Watch for: ? Redness, swelling, or pain.  ? Fluid, blood, or pus.  Raise (elevate) the injured area above the level of your heart while you are sitting or lying down.  If directed, put ice on the affected area: ? Put ice in a plastic bag. ? Place a towel between your skin and the bag. ? Leave the ice on for 20 minutes, 2-3 times a day.  Prevent scarring by covering your wound with sunscreen of at least 30 SPF whenever you are outside after your wound has healed.  Keep all follow-up visits as told by your  doctor. This is important. Get help if:  You got a tetanus shot and you have any of these problems at the injection site: ? Swelling. ? Very bad pain. ? Redness. ? Bleeding.  You have a fever.  A wound that was closed breaks open.  You notice a bad smell coming from your wound or your bandage.  You notice something coming out of the wound, such as wood or glass.  Medicine does not relieve your pain.  You have more redness, swelling, or pain at the site of your wound.  You have fluid, blood, or pus coming from your wound.  You notice a change in the color of your skin near your wound.  You need to change the bandage often because fluid, blood, or pus is coming from the wound.  You start to have a new rash.  You start to have numbness around the wound. Get help right away if:  You have very bad swelling around the wound.  Your pain suddenly gets worse and is very bad.  You notice painful lumps near the wound or anywhere on your body.  You have a red streak going away from your wound.  The wound is on your hand or foot, and: ? You cannot move a finger or toe. ? Your fingers or toes look pale or bluish. Summary  A laceration is a cut that may go through all layers of the skin. The cut may also go into the tissue right under the skin.  Some cuts heal on their own. Others need to be closed with stitches, staples, skin adhesive strips, or skin glue.  Follow your doctor's instructions for caring for your  cut. Proper care of a cut lowers the risk of infection, helps the cut heal better, and prevents scarring. This information is not intended to replace advice given to you by your health care provider. Make sure you discuss any questions you have with your health care provider. Document Released: 01/06/2008 Document Revised: 09/17/2017 Document Reviewed: 08/09/2017 Elsevier Patient Education  2020 [employer].

## 2019-02-09 NOTE — Progress Notes (Signed)
Patient ID: Maria Wiley DOB: [birth year] AGE: 43 y.o. MRN: [id number]   PCP: Hoyt Koch, MD   Chief Complaint:  Chief Complaint  Patient presents with  . right thumb laceration     Subjective:    HPI:  Maria Wiley is a 43 y.o. female presents for evaluation  Chief Complaint  Patient presents with  . right thumb laceration    43 year old female presents to [facility] with right thumb laceration. Patient working at [employer] at time of injury. Was placing large glass plate in to frame, lacerated palmar aspect of right thumb along edge of glass. No broken glass. No foreign body. Burning/stinging pain at time of injury; mild. No pain currently. Has not cleaned wound. Applied papertowel and pressure for bleeding. Denies finger weakness or paresthesias. Patient is right hand dominant. No previous right thumb injury/trauma/fracture/surgery. Tetanus is up to date; within past 5 years. Patient with RA and ankylosing spondylitis, currently well-controlled, on immunosuppressive medication (Enbrel, Aimovig and Arava).  A limited review of symptoms was performed, pertinent positives and negatives as mentioned in HPI.  The following portions of the patient's history were reviewed and updated as appropriate: allergies, current medications and past medical history.  Patient Active Problem List   Diagnosis Date Noted  . Attention and concentration deficit 01/24/2019  . Abdominal pain 11/16/2018  . Nausea vomiting and diarrhea 11/16/2018  . Fatigue 11/16/2018  . Weakness 10/11/2018  . New daily persistent headache 10/11/2018  . Episode of change in speech 10/11/2018  . Left flank pain 06/20/2018  . Hair loss 02/25/2018  . Ankylosing spondylitis ([place]) 01/15/2018  . OA (osteoarthritis) of knee 01/15/2018  . Routine general medical examination at a health care facility 01/15/2018  . IBS (irritable bowel syndrome) 01/15/2018  . RA  (rheumatoid arthritis) ([place])   . Hypothyroidism     No Known Allergies  Current Outpatient Medications on File Prior to Visit  Medication Sig Dispense Refill  . cholecalciferol (VITAMIN D) 1000 units tablet Take 1,000 Units by mouth daily.    . Diclofenac Sodium (PENNSAID) 2 % SOLN Place 1 application onto the skin 2 (two) times daily. (Patient not taking: Reported on 02/09/2019) 1 Bottle 3  . dicyclomine (BENTYL) 20 MG tablet Take 1 tablet (20 mg total) by mouth 2 (two) times daily for 5 days. 10 tablet 0  . doxycycline (VIBRAMYCIN) 100 MG capsule Take 100 mg by mouth 2 (two) times daily.   3  . Erenumab-aooe (AIMOVIG) 140 MG/ML SOAJ Inject 140 mg into the skin every 30 (thirty) days. (Patient not taking: Reported on 02/09/2019) 1 pen 11  . escitalopram (LEXAPRO) 20 MG tablet TAKE 1/2 TABLETS BY MOUTH ONCE A DAY (Patient not taking: Reported on 02/09/2019) 45 tablet 0  . etanercept (ENBREL SURECLICK) 50 MG/ML injection Inject 0.98 mLs (50 mg total) into the skin once a week. (Patient not taking: Reported on 02/09/2019) 4 Syringe 1  . famotidine (PEPCID) 20 MG tablet Take 1 tablet (20 mg total) by mouth 2 (two) times daily for 5 days. 10 tablet 0  . folic acid (FOLVITE) 1 MG tablet Take 1 tablet (1 mg total) by mouth daily. (Patient not taking: Reported on 02/09/2019) 90 tablet 3  . leflunomide (ARAVA) 20 MG tablet Take 20 mg by mouth daily.    Marland Kitchen levothyroxine (SYNTHROID, LEVOTHROID) 25 MCG tablet Take 1 tablet (25 mcg total) by mouth daily before breakfast. (Patient not taking: Reported on 02/09/2019) 90 tablet 3  .  LINZESS 145 MCG CAPS capsule Take 145 mcg by mouth daily as needed.     . Magnesium 250 MG TABS Take 250 mg by mouth daily.    . ondansetron (ZOFRAN) 4 MG tablet Take 1 tablet (4 mg total) by mouth every 8 (eight) hours as needed for nausea or vomiting. (Patient not taking: Reported on 02/09/2019) 6 tablet 0  . valACYclovir (VALTREX) 1000 MG tablet Take 1 tablet (1,000 mg total) by mouth 2 (two)  times daily. (Patient not taking: Reported on 11/08/2018) 60 tablet 3  . VESICARE 10 MG tablet Take 1 tablet (10 mg total) by mouth daily. (Patient not taking: Reported on 02/09/2019) 90 tablet 3   No current facility-administered medications on file prior to visit.        Objective:   Vitals:   02/09/19 1520  BP: (!) 142/90  Pulse: 98  Resp: 18  Temp: 98.3 F (36.8 C)  SpO2: 98%     Wt Readings from Last 3 Encounters:  02/09/19 230 lb (104.3 kg)  11/12/18 234 lb (106.1 kg)  11/08/18 234 lb (106.1 kg)    Physical Exam:   General Appearance:  Patient sitting comfortably on examination table. Conversational. Peri [place] self-historian. In no acute distress. Afebrile.   Head:  Normocephalic, without obvious abnormality, atraumatic  Lungs:   Clear to auscultation bilaterally  Heart:  Regular rate and rhythm  Extremities: Extremities normal, atraumatic, no cyanosis or edema  Pulses: 2+ and symmetric  Skin: Right thumb reveals 2cm linear laceration, palmar radial aspect. Superficial. No wound dehiscence with movement of thumb or with manual manipulation. Scant bleeding. No subcutaneous tissue visualized. No tendon/bone involvement. Good ROM. 5/5 motor strength. Sensation intact. Brisk capillary refill. Mild tenderness with palpation.  Lymph nodes: Cervical, supraclavicular, and axillary nodes normal  Neurologic: Normal    Assessment & Plan:    Exam findings, diagnosis etiology and medication use and indications reviewed with patient. Follow-Up and discharge instructions provided. No emergent/urgent issues found on exam.  Patient education was provided.   Patient verbalized understanding of information provided and agrees with plan of care (POC), all questions answered. The patient is advised to call or return to clinic if condition does not see an improvement in symptoms, or to seek the care of the closest emergency department if condition worsens with the below plan.    Procedure:  Wound/laceration repair Verbal consent provided by patient. Patient sitting on examination table. Pressure bandage of 2x2 gauze and CoFlex applied for 10 minutes. Pressure bandage removed, bleeding stopped. Wound cleaned with Iodine swab. Wound closed with Dermabond. Allowed to dry. No bandage applied. Patient tolerated procedure well.   1. Laceration of right thumb without complication, initial encounter  Patient 15 minutes status post laceration to right thumb. VSS, afebrile, in no acute distress, normal PMS of right thumb. Wound cleaned and closed using tissue adhesive. Wound care discussed. Patient up to date on tetanus. No antibiotic indicated; discussed close monitoring for wound infection. Advised patient follow-up with PCP, urgent care, or ED with signs of wound infection (as discussed) or more serious injury including finger weakness or paresthesias. Patient agreed with plan.   Janalyn Harder, MHS, PA-C Rulon Sera, MHS, PA-C Advanced Practice Provider [facility]  [phone] [place], [place] [address] (p): [phone] [email or web address]

## 2019-02-20 ENCOUNTER — Ambulatory Visit (INDEPENDENT_AMBULATORY_CARE_PROVIDER_SITE_OTHER): Payer: No Typology Code available for payment source | Admitting: Family

## 2019-02-20 ENCOUNTER — Other Ambulatory Visit: Payer: Self-pay

## 2019-02-20 ENCOUNTER — Encounter: Payer: Self-pay | Admitting: Family

## 2019-02-20 VITALS — BP 140/94 | HR 69 | Temp 98.7°F | Ht 67.0 in | Wt 227.6 lb

## 2019-02-20 DIAGNOSIS — I10 Essential (primary) hypertension: Secondary | ICD-10-CM

## 2019-02-20 MED ORDER — AMLODIPINE BESYLATE 5 MG PO TABS: 5.0000 mg | ORAL_TABLET | Freq: Every day | ORAL | 0 refills | Status: DC

## 2019-02-20 MED FILL — AMLODIPINE BESYLATE 5 MG TA: 5 | 90 days supply | Qty: 90 | Fill #0

## 2019-02-20 NOTE — Progress Notes (Signed)
Maria Wiley is a 43 y.o. female with the following history as recorded in [facility]:  Patient Active Problem List   Diagnosis Date Noted  . Attention and concentration deficit 01/24/2019  . Abdominal pain 11/16/2018  . Nausea vomiting and diarrhea 11/16/2018  . Fatigue 11/16/2018  . Weakness 10/11/2018  . New daily persistent headache 10/11/2018  . Episode of change in speech 10/11/2018  . Left flank pain 06/20/2018  . Hair loss 02/25/2018  . Ankylosing spondylitis ([place]) 01/15/2018  . OA (osteoarthritis) of knee 01/15/2018  . Routine general medical examination at a health care facility 01/15/2018  . IBS (irritable bowel syndrome) 01/15/2018  . RA (rheumatoid arthritis) ([place])   . Hypothyroidism     Current Outpatient Medications  Medication Sig Dispense Refill  . cholecalciferol (VITAMIN D) 1000 units tablet Take 1,000 Units by mouth daily.    Eduard Roux (AIMOVIG) 140 MG/ML SOAJ Inject 140 mg into the skin every 30 (thirty) days. 1 pen 11  . escitalopram (LEXAPRO) 20 MG tablet TAKE 1/2 TABLETS BY MOUTH ONCE A DAY 45 tablet 0  . etanercept (ENBREL SURECLICK) 50 MG/ML injection Inject 0.98 mLs (50 mg total) into the skin once a week. 4 Syringe 1  . folic acid (FOLVITE) 1 MG tablet Take 1 tablet (1 mg total) by mouth daily. 90 tablet 3  . leflunomide (ARAVA) 20 MG tablet Take 20 mg by mouth daily.    Marland Kitchen levothyroxine (SYNTHROID, LEVOTHROID) 25 MCG tablet Take 1 tablet (25 mcg total) by mouth daily before breakfast. 90 tablet 3  . LINZESS 145 MCG CAPS capsule Take 145 mcg by mouth daily as needed.     . Magnesium 250 MG TABS Take 250 mg by mouth daily.    . ondansetron (ZOFRAN) 4 MG tablet Take 1 tablet (4 mg total) by mouth every 8 (eight) hours as needed for nausea or vomiting. 6 tablet 0  . valACYclovir (VALTREX) 1000 MG tablet Take 1 tablet (1,000 mg total) by mouth 2 (two) times daily. 60 tablet 3  . VESICARE 10 MG tablet Take 1 tablet (10 mg total) by mouth daily.  90 tablet 3  . tretinoin (RETIN-A) 0.025 % cream APPLY AA QHS     No current facility-administered medications for this visit.     Allergies: Patient has no known allergies.  Past Medical History:  Diagnosis Date  . Ankylosing spondylitis ([place])   . Chest pain   . GERD (gastroesophageal reflux disease)   . Migraines   . RA (rheumatoid arthritis) ([place])   . Thyroid disease   . Urinary incontinence     Past Surgical History:  Procedure Laterality Date  . ABDOMINAL HYSTERECTOMY    . APPENDECTOMY    . CHOLECYSTECTOMY    . HERNIA REPAIR  2004  . KNEE SURGERY Right 2006    Family History  Problem Relation Age of Onset  . CAD Maternal Grandfather   . Diabetes Maternal Grandfather   . Hearing loss Maternal Grandfather   . Heart disease Maternal Grandfather   . Hyperlipidemia Maternal Grandfather   . Hypertension Mother   . Arthritis Mother   . Arthritis Maternal Grandmother   . Heart disease Maternal Grandmother   . Hyperlipidemia Maternal Grandmother   . Migraines Neg Hx   . Headache Neg Hx     Social History   Tobacco Use  . Smoking status: Never Smoker  . Smokeless tobacco: Never Used  Substance Use Topics  . Alcohol use: Never  Frequency: Never    Subjective:  Patient presents with concerns for elevated blood pressure; went to the dentist last week and was told that her pressure was elevated; has been having having increased headaches/ dizziness x 6 weeks or so- thought it was her migraines but not responding to rescue medications; FH of hypertension; Denies any chest pain, shortness of breath, blurred vision. Denies any palpitations.   Brings in readings from her home cuff and are consistent with what is seen in the office today- averaging 140-150/90-95;    Objective:  Vitals:   02/20/19 1104  BP: (!) 140/94  Pulse: 69  Temp: 98.7 F (37.1 C)  TempSrc: Oral  SpO2: 97%  Weight: 227 lb 9.6 oz (103.2 kg)  Height: 5\' 7"  (1.702 m)    General: Well developed,  well nourished, in no acute distress  Skin : Warm and dry.  Head: Normocephalic and atraumatic  Lungs: Respirations unlabored; clear to auscultation bilaterally without wheeze, rales, rhonchi  CVS exam: normal rate and regular rhythm.  Abdomen: Soft; nontender; nondistended; normoactive bowel sounds; no masses or hepatosplenomegaly  Musculoskeletal: No deformities; no active joint inflammation  Extremities: No edema, cyanosis, clubbing  Vessels: Symmetric bilaterally  Neurologic: Alert and oriented; speech intact; face symmetrical; moves all extremities well; CNII-XII intact without focal deficit   Assessment:  1. Hypertension, unspecified type     Plan:  1. New onset; update EKG- no acute ischemic changes noted; trial of Amlodipine 5 mg daily; continue to monitor her blood pressure at home; follow-up with her PCP for re-check/ CPE in 2-4 weeks.   No follow-ups on file.  Orders Placed This Encounter  Procedures  . EKG 12-Lead    Requested Prescriptions    No prescriptions requested or ordered in this encounter

## 2019-03-01 MED FILL — AIMOVIG 140 MG/ML SOAJ: 140 | 30 days supply | Qty: 1 | Fill #3

## 2019-03-03 ENCOUNTER — Other Ambulatory Visit: Payer: Self-pay | Admitting: Pharmacist

## 2019-03-03 MED ORDER — ENBREL SURECLICK 50 MG/ML ~~LOC~~ SOAJ: 50.0000 mg | SUBCUTANEOUS | 0 refills | Status: DC

## 2019-03-03 MED FILL — ENBREL SURECLICK 50 MG/ML S: 50 | 28 days supply | Qty: 4 | Fill #0

## 2019-03-09 ENCOUNTER — Other Ambulatory Visit: Payer: Self-pay

## 2019-03-09 ENCOUNTER — Encounter: Payer: No Typology Code available for payment source | Attending: Psychology | Admitting: Psychology

## 2019-03-09 ENCOUNTER — Encounter: Payer: Self-pay | Admitting: Psychology

## 2019-03-09 DIAGNOSIS — R41840 Attention and concentration deficit: Secondary | ICD-10-CM | POA: Diagnosis not present

## 2019-03-09 DIAGNOSIS — R413 Other amnesia: Secondary | ICD-10-CM | POA: Diagnosis not present

## 2019-03-09 DIAGNOSIS — F09 Unspecified mental disorder due to known physiological condition: Secondary | ICD-10-CM

## 2019-03-09 DIAGNOSIS — F0789 Other personality and behavioral disorders due to known physiological condition: Secondary | ICD-10-CM

## 2019-03-09 NOTE — Progress Notes (Signed)
Neuropsychological Consultation   Patient:   Maria Wiley   DOB:   Jun 03, 1976  MR Number:  [id number]  Location:  [facility] FOR PAIN AND [facility] MEDICINE [facility] PHYSICAL MEDICINE AND REHABILITATION [address] [place], [address] [id number] [facility] [place] [address] Dept: [phone]           Date of Service:   03/09/2019  Start Time:   1 PM End Time:   3 PM  Provider/Observer:  Arley Phenix, Psy.D.       Clinical Neuropsychologist       Billing Code/Service: 23762, [phone]  Chief Complaint:    Maria Wiley is a 43 year old female referred by Dr. Lucia Gaskins for neuropsychological evaluation due to memory loss and attention and concentration difficulties.  The patient was initially referred to Dr. Lucia Gaskins due to headaches of a migrainous nature.  While the patient's migraines have begun been successfully treated with reduced frequency the patient has continued to describe difficulties with memory and attention/concentration.  Reason for Service:  Maria Wiley is a 43 year old female referred by Dr. Lucia Gaskins for neuropsychological evaluation due to memory loss and attention and concentration difficulties.  The patient was initially referred to Dr. Lucia Gaskins due to headaches of a migrainous nature.  While the patient's migraines have begun been successfully treated with reduced frequency the patient has continued to describe difficulties with memory and attention/concentration.  The patient reports that she has been dealing with chronic migraine headaches for many years.  The patient reports that recent MRIs have revealed several areas of white matter changes that have been considered possibly due to her migraines versus other causes.  The patient is also been diagnosed with hypertension and rheumatoid arthritis, hypothyroidism, irritable bowel syndrome in the past and fatigue as well but no other significant medical issues.  The patient reports that she first  noted memory and attention difficulties about 8 to 12 months ago with a gradual onset but progressively worsening over time.  The patient reports that she has difficulty remembering conversations that she has had or remembering something that she may have just completed.  The patient also reports that she has difficulty remembering some significant past experiences including the childbirth of her 4 children or other past experiences that should have been memorable.  The patient reports that she is also forgetting everyday events as well.  The patient reports that she has to write things down to keep up and track with them.  She reports that while her job with [employer] has been stressful and they have been opening up a new office she is always been able to handle these issues in the past.  The patient is also describes some tremors in her right hand.  The patient denies any history of loss of consciousness or other head traumas.  Initially, the patient felt that this was simply due to changes due to aging but she is only 43 years old.  The patient reports that her family has been noticing these changes as well and she is worried that her fellow employees at work will notice her difficulties.  The patient reports that she is making lists to write things down so she does not forget them.  The patient initially thought that this may be related to something like attention deficit disorder and she reports that she was assessed for this.  However, the patient denies any history of attentional difficulties up until the past year which would argue strongly against a diagnosis  of ADHD or adult residual attention deficit disorder.  The patient reports that her sleep is somewhat disturbed.  The patient reports that she sleeps approximately 4 hours throughout the night.  The patient reports that she wakes up several times and that she is very restless and cannot fall back to sleep.  MRI results have indicate abnormal  but nonspecific signal changes in cerebral white matter, most notably in the right middle frontal gyrus and the posterior body of the corpus callosum.  The interpretation left top differential considerations to include accelerated/hereditary small vessel ischemia, sequela of trauma, hypercoagulable state, vasculitis, prior infection or demyelinization.  Current Status:   The patient describes the development of memory and attention/concentration difficultybetween 8 and 12 months ago.  There have been some stressors related to 1 of her children being diagnosed with type 1 diabetes but that is being well managed.  The patient does have some significant stressors at work with her office open up a new office in [place] but the patient reports that she is always been able to handle these types of stressors in the past.    Reliability of Information: The information is derived from 1 hour face-to-face clinical interview with the patient as well as review of available medical records.  Behavioral Observation: Maria Wiley  presents as a 43 y.o.-year-old Right African American Female who appeared her stated age. her dress was Appropriate and she was Well Groomed and her manners were Appropriate to the situation.  her participation was indicative of Appropriate and Attentive behaviors.  There were not any physical disabilities noted.  she displayed an appropriate level of cooperation and motivation.     Interactions:    Active Appropriate and Attentive  Attention:   within normal limits and but the patient is describing attention and concentration difficulties.  This was not formally assessed today but the patient was able to follow along with the questions and discussions fairly easily today and is clearly a quite intelligent woman.  Memory:   abnormal; the patient is reporting difficulties learning new information as well as recalling significant past events that should be quite memorable to  her.  Visuo-spatial:  not examined  Speech (Volume):  normal  Speech:   normal; normal  Thought Process:  Coherent and Relevant  Though Content:  WNL; not suicidal and not homicidal  Orientation:   person, place, time/date and situation  Judgment:   Good  Planning:   Good  Affect:    Appropriate  Mood:    There was no indication of any mood disturbance during the clinical interview.  However, the patient does report that she has had issues of anxiety and depression over the past 10 years and has been treated with medications when appropriate.  She currently takes Lexapro, duloxetine, BuSpar and Cymbalta.  Insight:   Good  Intelligence:   high  Marital Status/Living: The patient was born and raised in [place] with 1 brother.  The patient currently lives with her spouse and 4 children although her oldest is in college now.  The patient has a 39 year old son, 64 year old son, 92-year-old son and an 6-year-old son.  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  Past Employment:  The patient was in the [employer] for 22 years prior to that.  She was in the [employer] between 1995 and 2017.  Her highest rank during her [employer] service was E8 and her rank at  discharge was E8.  The patient had an honorable discharge.  Substance Use:  No concerns of substance abuse are reported.  The patient reports an occasional glass of wine but no other substance use past or present.  Education:   The patient received her bachelor's degree from [employer] and is just started graduate school.  Extracurricular activities in school included sports such as softball and basketball as well as running track.  Medical History:   Past Medical History:  Diagnosis Date  . Ankylosing spondylitis (HCC)   . Chest pain   . GERD (gastroesophageal reflux disease)   . Migraines   . RA (rheumatoid arthritis) (HCC)   . Thyroid  disease   . Urinary incontinence        Abuse/Trauma History: The patient denies any history of abuse or trauma but does acknowledge some current family stressors.  Psychiatric History:  The patient has had times of depression anxiety that have been managed primarily with psychotropic medications.  No significant mood disorders noted.  Family Med/Psych History:  Family History  Problem Relation Age of Onset  . CAD Maternal Grandfather   . Diabetes Maternal Grandfather   . Hearing loss Maternal Grandfather   . Heart disease Maternal Grandfather   . Hyperlipidemia Maternal Grandfather   . Hypertension Mother   . Arthritis Mother   . Arthritis Maternal Grandmother   . Heart disease Maternal Grandmother   . Hyperlipidemia Maternal Grandmother   . Migraines Neg Hx   . Headache Neg Hx     Risk of Suicide/Violence: virtually non-existent the patient denies any suicidal or homicidal ideation.  Impression/DX:  Maria Wiley is a 43 year old female referred by Dr. Lucia Gaskins for neuropsychological evaluation due to memory loss and attention and concentration difficulties.  The patient was initially referred to Dr. Lucia Gaskins due to headaches of a migrainous nature.  While the patient's migraines have begun been successfully treated with reduced frequency the patient has continued to describe difficulties with memory and attention/concentration.  The patient describes the development of memory and attention/concentration difficultybetween 8 and 12 months ago.  There have been some stressors related to 1 of her children being diagnosed with type 1 diabetes but that is being well managed.  The patient does have some significant stressors at work with her office open up a new office in [place] but the patient reports that she is always been able to handle these types of stressors in the past.    Disposition/Plan:  We have set the patient up for formal neuropsychological testing including the Wechsler  Adult Intelligence Scale-IV as well as the Wechsler Memory Scale-IV.  We will also look at other memory task as well as attention and concentration task.  Diagnosis:    Cognitive and neurobehavioral dysfunction  Memory loss  Attention and concentration deficit         Electronically Signed   _______________________ Arley Phenix, Psy.D.

## 2019-03-10 ENCOUNTER — Encounter: Payer: No Typology Code available for payment source | Admitting: Internal Medicine

## 2019-03-10 ENCOUNTER — Ambulatory Visit (INDEPENDENT_AMBULATORY_CARE_PROVIDER_SITE_OTHER): Payer: No Typology Code available for payment source | Admitting: Internal Medicine

## 2019-03-10 ENCOUNTER — Encounter: Payer: Self-pay | Admitting: Internal Medicine

## 2019-03-10 VITALS — BP 124/82 | HR 83 | Temp 98.4°F | Ht 67.0 in | Wt 230.0 lb

## 2019-03-10 DIAGNOSIS — Z0000 Encounter for general adult medical examination without abnormal findings: Secondary | ICD-10-CM | POA: Diagnosis not present

## 2019-03-10 DIAGNOSIS — E039 Hypothyroidism, unspecified: Secondary | ICD-10-CM

## 2019-03-10 DIAGNOSIS — G4452 New daily persistent headache (NDPH): Secondary | ICD-10-CM

## 2019-03-10 DIAGNOSIS — R41840 Attention and concentration deficit: Secondary | ICD-10-CM | POA: Diagnosis not present

## 2019-03-10 DIAGNOSIS — I10 Essential (primary) hypertension: Secondary | ICD-10-CM

## 2019-03-10 DIAGNOSIS — M0600 Rheumatoid arthritis without rheumatoid factor, unspecified site: Secondary | ICD-10-CM

## 2019-03-10 NOTE — Progress Notes (Signed)
   Subjective:   Patient ID: Maria Wiley, female    DOB: 06-Apr-1976, 43 y.o.   MRN: [id number]  HPI The patient is a 43 YO female coming in for physical. Still awaiting evaluation of concentration problems, started on amlodipine about 2-3 weeks ago.   PMH, [facility], social history reviewed and updated  Review of Systems  Constitutional: Negative.   HENT: Negative.   Eyes: Negative.   Respiratory: Negative for cough, chest tightness and shortness of breath.   Cardiovascular: Negative for chest pain, palpitations and leg swelling.  Gastrointestinal: Negative for abdominal distention, abdominal pain, constipation, diarrhea, nausea and vomiting.  Musculoskeletal: Negative.        Stiffness morning, stable from usual  Skin: Negative.   Neurological: Negative.   Psychiatric/Behavioral: Positive for decreased concentration.    Objective:  Physical Exam Constitutional:      Appearance: She is well-developed.  HENT:     Head: Normocephalic and atraumatic.  Neck:     Musculoskeletal: Normal range of motion.  Cardiovascular:     Rate and Rhythm: Normal rate and regular rhythm.  Pulmonary:     Effort: Pulmonary effort is normal. No respiratory distress.     Breath sounds: Normal breath sounds. No wheezing or rales.  Abdominal:     General: Bowel sounds are normal. There is no distension.     Palpations: Abdomen is soft.     Tenderness: There is no abdominal tenderness. There is no rebound.  Skin:    General: Skin is warm and dry.  Neurological:     Mental Status: She is alert and oriented to person, place, and time.     Coordination: Coordination normal.     Vitals:   03/10/19 0856  BP: 124/82  Pulse: 83  Temp: 98.4 F (36.9 C)  TempSrc: Oral  SpO2: 97%  Weight: 230 lb (104.3 kg)  Height: 5\' 7"  (1.702 m)    Assessment & Plan:

## 2019-03-10 NOTE — Assessment & Plan Note (Signed)
Stable labs, no adjustment today of her synthroid 25 mcg daily.

## 2019-03-10 NOTE — Assessment & Plan Note (Signed)
On aimovig which is helping. Likely not related to new hypertension.

## 2019-03-10 NOTE — Assessment & Plan Note (Signed)
Flu shot yearly. Tetanus up to date. Mammogram due Nov 2020, pap smear not indicated. Counseled about sun safety and mole surveillance. Counseled about the dangers of distracted driving. Given 10 year screening recommendations.

## 2019-03-10 NOTE — Assessment & Plan Note (Signed)
BP at goal on amlodipine. Recent EKG reviewed. Will continue as she is under stressful circumstances and not exercising. Advised DASH diet and exercise regular and we could consider trial off meds as most readings in last year have been at goal prior to stress of pandemic.

## 2019-03-10 NOTE — Assessment & Plan Note (Signed)
Still awaiting evaluation.

## 2019-03-10 NOTE — Assessment & Plan Note (Signed)
Seeing oncology and on embrel still. Labs every 3 months with them. No flare currently. Some morning stiffness which is stable.

## 2019-03-10 NOTE — Patient Instructions (Signed)
Health Maintenance, Female Adopting a healthy lifestyle and getting preventive care are important in promoting health and wellness. Ask your health care provider about:  The right schedule for you to have regular tests and exams.  Things you can do on your own to prevent diseases and keep yourself healthy. What should I know about diet, weight, and exercise? Eat a healthy diet   Eat a diet that includes plenty of vegetables, fruits, low-fat dairy products, and lean protein.  Do not eat a lot of foods that are high in solid fats, added sugars, or sodium. Maintain a healthy weight Body mass index (BMI) is used to identify weight problems. It estimates body fat based on height and weight. Your health care provider can help determine your BMI and help you achieve or maintain a healthy weight. Get regular exercise Get regular exercise. This is one of the most important things you can do for your health. Most adults should:  Exercise for at least 150 minutes each week. The exercise should increase your heart rate and make you sweat (moderate-intensity exercise).  Do strengthening exercises at least twice a week. This is in addition to the moderate-intensity exercise.  Spend less time sitting. Even light physical activity can be beneficial. Watch cholesterol and blood lipids Have your blood tested for lipids and cholesterol at 44 years of age, then have this test every 5 years. Have your cholesterol levels checked more often if:  Your lipid or cholesterol levels are high.  You are older than 44 years of age.  You are at high risk for heart disease. What should I know about cancer screening? Depending on your health history and family history, you may need to have cancer screening at various ages. This may include screening for:  Breast cancer.  Cervical cancer.  Colorectal cancer.  Skin cancer.  Lung cancer. What should I know about heart disease, diabetes, and high blood  pressure? Blood pressure and heart disease  High blood pressure causes heart disease and increases the risk of stroke. This is more likely to develop in people who have high blood pressure readings, are of African descent, or are overweight.  Have your blood pressure checked: ? Every 3-5 years if you are 18-39 years of age. ? Every year if you are 40 years old or older. Diabetes Have regular diabetes screenings. This checks your fasting blood sugar level. Have the screening done:  Once every three years after age 40 if you are at a normal weight and have a low risk for diabetes.  More often and at a younger age if you are overweight or have a high risk for diabetes. What should I know about preventing infection? Hepatitis B If you have a higher risk for hepatitis B, you should be screened for this virus. Talk with your health care provider to find out if you are at risk for hepatitis B infection. Hepatitis C Testing is recommended for:  Everyone born from 1945 through 1965.  Anyone with known risk factors for hepatitis C. Sexually transmitted infections (STIs)  Get screened for STIs, including gonorrhea and chlamydia, if: ? You are sexually active and are younger than 44 years of age. ? You are older than 44 years of age and your health care provider tells you that you are at risk for this type of infection. ? Your sexual activity has changed since you were last screened, and you are at increased risk for chlamydia or gonorrhea. Ask your health care provider if   you are at risk.  Ask your health care provider about whether you are at high risk for HIV. Your health care provider may recommend a prescription medicine to help prevent HIV infection. If you choose to take medicine to prevent HIV, you should first get tested for HIV. You should then be tested every 3 months for as long as you are taking the medicine. Pregnancy  If you are about to stop having your period (premenopausal) and  you may become pregnant, seek counseling before you get pregnant.  Take 400 to 800 micrograms (mcg) of folic acid every day if you become pregnant.  Ask for birth control (contraception) if you want to prevent pregnancy. Osteoporosis and menopause Osteoporosis is a disease in which the bones lose minerals and strength with aging. This can result in bone fractures. If you are 65 years old or older, or if you are at risk for osteoporosis and fractures, ask your health care provider if you should:  Be screened for bone loss.  Take a calcium or vitamin D supplement to lower your risk of fractures.  Be given hormone replacement therapy (HRT) to treat symptoms of menopause. Follow these instructions at home: Lifestyle  Do not use any products that contain nicotine or tobacco, such as cigarettes, e-cigarettes, and chewing tobacco. If you need help quitting, ask your health care provider.  Do not use street drugs.  Do not share needles.  Ask your health care provider for help if you need support or information about quitting drugs. Alcohol use  Do not drink alcohol if: ? Your health care provider tells you not to drink. ? You are pregnant, may be pregnant, or are planning to become pregnant.  If you drink alcohol: ? Limit how much you use to 0-1 drink a day. ? Limit intake if you are breastfeeding.  Be aware of how much alcohol is in your drink. In the [place] one drink equals one 12 oz bottle of beer (355 mL), one 5 oz glass of wine (148 mL), or one 1 oz glass of hard liquor (44 mL). General instructions  Schedule regular health, dental, and eye exams.  Stay current with your vaccines.  Tell your health care provider if: ? You often feel depressed. ? You have ever been abused or do not feel safe at home. Summary  Adopting a healthy lifestyle and getting preventive care are important in promoting health and wellness.  Follow your health care provider's instructions about healthy  diet, exercising, and getting tested or screened for diseases.  Follow your health care provider's instructions on monitoring your cholesterol and blood pressure. This information is not intended to replace advice given to you by your health care provider. Make sure you discuss any questions you have with your health care provider. Document Released: 02/02/2011 Document Revised: 07/13/2018 Document Reviewed: 07/13/2018 Elsevier Patient Education  2020 [employer]  

## 2019-03-23 ENCOUNTER — Encounter: Payer: Self-pay | Admitting: Internal Medicine

## 2019-03-23 MED ORDER — VALACYCLOVIR HCL 1 G PO TABS: 1000.0000 mg | ORAL_TABLET | Freq: Two times a day (BID) | ORAL | 3 refills | Status: DC

## 2019-03-23 MED FILL — valACYclovir HCL 1 GM TABS: 1 | 30 days supply | Qty: 60 | Fill #0

## 2019-03-24 ENCOUNTER — Other Ambulatory Visit: Payer: Self-pay

## 2019-03-24 ENCOUNTER — Encounter: Payer: No Typology Code available for payment source | Admitting: Psychology

## 2019-03-24 ENCOUNTER — Encounter: Payer: Self-pay | Admitting: Psychology

## 2019-03-24 DIAGNOSIS — F09 Unspecified mental disorder due to known physiological condition: Secondary | ICD-10-CM

## 2019-03-24 DIAGNOSIS — F0789 Other personality and behavioral disorders due to known physiological condition: Secondary | ICD-10-CM

## 2019-03-24 NOTE — Progress Notes (Signed)
The patient arrived on time to her 13:00 testing appointment. The evaluation lasted 210 minutes.   Mental Status & Behavioral Observations:  Appearance: Casually and appropriately dressed and groomed.  Gait: Ambulated independently without assistance.  Speech:  Clear, normal rate, tone, & volume.  Thought process: Linear & organized. No evidence of delusion/hallucination or bizarre content. Mood & Affect: Mildly anxious but mostly euthymic, appropriate  Interpersonal: Pleasant, appropriate. Orientation: Oriented x 4 Insight/Judgement: Intact  She was cooperative with all assigned tasks and gave excellent effort. She had no difficulty hearing or understanding test instructions and did not require much prompting. She exhibited good frustration tolerance on questions she did not know or tasks that were more difficult.    Tests Administered:  [employer], 3rd Edition, Standard Form  Clock Drawing Test   Wechsler Adult Intelligence Scale-4th Edition (WAIS-IV)  Wechsler Memory Scale, 4th Edition, Adult Battery (WMS-IV-A)  Results: Clock Drawing Test  . Within Normal Limits  CVLT-III, Standard Form  Core Score Summary    Immediate Recall Score Raw score Scaled score Percentile rank    Trial 1 Correct 9 14 91    Trial 2 Correct 10 11 63    Trial 3 Correct 13 12 75    Trial 4 Correct 14 12 75    Trial 5 Correct 14 12 75    List B Correct 8 14 91      Delayed Recall Score Raw score Scaled score Percentile rank    Short Delay Free Recall Correct 14 13 84    Short Delay Cued Recall Correct 16 16 98    Long Delay Free Recall Correct 0 1 0.1    Long Delay Cued Recall Correct 0 1 0.1      Recall Errors Score Raw score Scaled score Percentile rank    Total Intrusions 1 12 75    Standard Score Summary Index Sum of scaled scores Index score Percentile rank    Trials 1-5 Correct 61 113 81    Delayed Recall Correct 31 89 23    Total Recall  Correct 106 103 58     Immediate Recall Score Raw score Scaled score Percentile rank    Trials 1-5 Learning Slope Analysis 1.4 10 50    Trials 1-5 Recall Discriminability 2.6 13 84      Delayed Recall Score Raw score Scaled score Percentile rank    Total Recall Discriminability 2.2 10 50    Intrusion Errors Score Raw score Scaled score Percentile rank    Trials 1-5 1 10  50    Free Recall 1 12 75    Delayed Recall 0 13 84     Repetition Errors Score Raw score Scaled score Percentile rank    Total Repetitions 4 11 63    Total Target Repetitions 4 11 63    Standard Score Score Sum of raw scores Standard score Percentile rank    Total Recall Responses 103 93 32     Delayed Recall Score Scaled score Demographically adjusted score    Short Delay Free Recall Correct 13 55    Short Delay Cued Recall Correct 16 66    Long Delay Free Recall Correct 1 20    Long Delay Cued Recall Correct 1 20      Standard Score Score Standard score Demographically adjusted score    Total Recall Responses 93 40     WAIS-IV Composite Score Summary  Scale Sum of Scaled Scores Composite Score Percentile Rank  95% Conf. Interval Qualitative Description  Verbal Comprehension 27 VCI 95 37 90-101 Average  Perceptual Reasoning 29 PRI 98 45 92-104 Average  Working Memory 19 WMI 97 42 90-104 Average  General Ability 56 GAI 96 39 91-101 Average   Index Level Discrepancy Comparisons  Comparison Score 1 Score 2 Difference Critical Value .05 Significant Difference Y/N Base Rate by Overall Sample  VCI - PRI 95 98 -3 8.81 N 43.4  VCI - WMI 95 97 -2 8.81 N 45.4  PRI - WMI 98 97 1 9.29 N 49.0   Verbal Comprehension Subtests Summary  Subtest Raw Score Scaled Score Percentile Rank Reference Group Scaled Score SEM  Similarities 24 9 37 10 1.04  Vocabulary 42 11 63 12 0.73  Information 8 7 16 7  0.85   Perceptual Reasoning Subtests Summary  Subtest Raw Score Scaled Score Percentile  Rank Reference Group Scaled Score SEM  Block Design 36 9 37 8 0.99  Matrix Reasoning 21 12 75 12 0.95  Visual Puzzles 12 8 25 8  1.04  (Figure Weights) 15 10 50 10 0.99  (Picture Completion) 8 6 9 6  1.20   Working Doctor, general practice Raw Score Scaled Score Percentile Rank Reference Group Scaled Score SEM  Digit Span 24 8 25 8  0.73  Arithmetic 16 11 63 11 0.99  (Letter-Number Seq.) 18 9 37 8 1.12   Working Counsellor Score Summary  Process Score Raw Score Scaled Score Percentile Rank Base Rate SEM  Digit Span Forward 7 6 9  -- 1.20  Digit Span Backward 8 9 37 -- 1.12  Digit Span Sequencing 9 10 50 -- 1.24  Longest Digit Span Forward 5 -- -- 95.5 --  Longest Digit Span Backward 4 -- -- 85.5 --  Longest Digit Span Sequence 6 -- -- 70.5 --  Longest Letter-Number Sequence 6 -- -- 44.5 --   WMS-IV-A Primary Subtest Scaled Score Summary  Subtest Domain Raw Score Scaled Score Percentile Rank  Logical Memory I AM 29 12 75  Logical Memory II AM 24 11 63  Visual Reproduction II VM 38 14 91   PROCESS SCORE CONVERSIONS  Auditory Memory Process Score Summary  Process Score Raw Score Scaled Score Percentile Rank Cumulative Percentage (Base Rate)  LM II Recognition 25 - - 51-75%   Visual Memory Process Score Summary  Process Score Raw Score Scaled Score Percentile Rank Cumulative Percentage (Base Rate)  VR II Recognition 6 - - 51-75%

## 2019-03-31 MED FILL — AIMOVIG 140 MG/ML SOAJ: 140 | 30 days supply | Qty: 1 | Fill #4

## 2019-04-11 MED FILL — SOLIFENACIN SUCCINATE 10 MG: 10 | 30 days supply | Qty: 30 | Fill #1

## 2019-04-14 MED FILL — predniSONE 5 MG TABS: 5 | 12 days supply | Qty: 48 | Fill #0

## 2019-04-18 ENCOUNTER — Other Ambulatory Visit: Payer: Self-pay | Admitting: Pharmacist

## 2019-04-18 MED ORDER — ENBREL SURECLICK 50 MG/ML ~~LOC~~ SOAJ: 50.0000 mg | SUBCUTANEOUS | 0 refills | Status: DC

## 2019-04-18 MED ORDER — ENBREL SURECLICK 50 MG/ML ~~LOC~~ SOAJ: 50.0000 mg | SUBCUTANEOUS | 1 refills | Status: DC

## 2019-04-25 MED FILL — ENBREL SURECLICK 50 MG/ML S: 50 | 28 days supply | Qty: 4 | Fill #0

## 2019-04-27 MED FILL — LEFLUNOMIDE 20 MG TABLET: 20 | 90 days supply | Qty: 90 | Fill #1

## 2019-04-27 MED FILL — AIMOVIG 140 MG/ML SOAJ: 140 | 30 days supply | Qty: 1 | Fill #0

## 2019-05-01 ENCOUNTER — Telehealth: Payer: Self-pay

## 2019-05-01 NOTE — Telephone Encounter (Signed)
PA for aimovig has been submitted.   (Key: [id number])  Your information has been sent to [place].

## 2019-05-02 ENCOUNTER — Other Ambulatory Visit: Payer: Self-pay

## 2019-05-02 ENCOUNTER — Encounter: Payer: No Typology Code available for payment source | Attending: Psychology | Admitting: Psychology

## 2019-05-02 ENCOUNTER — Encounter: Payer: Self-pay | Admitting: Psychology

## 2019-05-02 DIAGNOSIS — R41840 Attention and concentration deficit: Secondary | ICD-10-CM | POA: Diagnosis present

## 2019-05-02 DIAGNOSIS — F09 Unspecified mental disorder due to known physiological condition: Secondary | ICD-10-CM | POA: Diagnosis not present

## 2019-05-02 DIAGNOSIS — F418 Other specified anxiety disorders: Secondary | ICD-10-CM | POA: Diagnosis not present

## 2019-05-02 DIAGNOSIS — F0789 Other personality and behavioral disorders due to known physiological condition: Secondary | ICD-10-CM | POA: Diagnosis present

## 2019-05-02 DIAGNOSIS — Z72820 Sleep deprivation: Secondary | ICD-10-CM | POA: Diagnosis not present

## 2019-05-02 DIAGNOSIS — R413 Other amnesia: Secondary | ICD-10-CM | POA: Insufficient documentation

## 2019-05-02 NOTE — Progress Notes (Signed)
Neuropsychological Evaluation   Patient:  Maria Wiley   DOB: 03-22-1976  MR Number: [id number]  Location: [facility] PHYSICAL MEDICINE AND REHABILITATION [place], [id number] [place] [id number] Dept: [phone]  Start: 8 AM End: 9 AM  Provider/Observer:     Edgardo Roys PsyD  Chief Complaint:      Chief Complaint  Patient presents with  . Memory Loss  . Other    attention deficits    Reason For Service:     Maria Wiley is a 43 year old female referred by Dr. Jaynee Eagles for neuropsychological evaluation due to memory loss and attention and concentration difficulties.  The patient was initially referred to Dr. Jaynee Eagles due to headaches of a migrainous nature.  While the patient's migraines have been successfully treated with reduced frequency, the patient has continued to describe difficulties with memory and attention/concentration.  The patient reports that she has been dealing with chronic migraine headaches for many years.  The patient reports that recent MRIs have revealed several areas of white matter changes that have been considered possibly due to her migraines versus other causes.  The patient has also been diagnosed with hypertension and rheumatoid arthritis, hypothyroidism, irritable bowel syndrome in the past and fatigue but no other significant medical issues.  The patient reports that she first noted memory and attention difficulties about 8 to 12 months ago with a gradual onset but progressively worsening over time.  The patient reports that she has difficulty remembering conversations that she has had or remembering something that she may have just completed.  The patient also reports that she has difficulty remembering some significant past experiences including the childbirth of her 4 children or other past experiences that should have been memorable.  The patient  reports that she is also forgetting everyday events as well.  The patient reports that she has to write things down to keep up and track with them.  She reports that while her job with [employer] has been stressful and they have been opening up a new office she has always been able to handle these issues in the past.  The patient has also describes some tremors in her right hand.  The patient denies any history of loss of consciousness or other head traumas.  Initially, the patient felt that this was simply due to changes due to aging but she is only 43 years old.  The patient reports that her family has been noticing these changes as well and she has worried that her fellow employees at work will notice her difficulties.  The patient reports that she is making lists to write things down so she does not forget them.  The patient initially thought that this may be related to something like attention deficit disorder and she reports that she was assessed for this.  However, the patient denies any history of attentional difficulties up until the past year which would argue strongly against a diagnosis of ADHD or adult residual attention deficit disorder.  The patient reports that her sleep is somewhat disturbed.  The patient reports that she sleeps approximately 4 hours throughout the night.  The patient reports that she wakes up several times and that she is very restless and cannot fall back to sleep.  MRI results have indicate abnormal but nonspecific signal changes in cerebral white matter, most notably in the right middle frontal gyrus and the posterior body of the corpus  callosum.  The interpretation left top differential considerations to include accelerated/hereditary small vessel ischemia, sequela of trauma, hypercoagulable state, vasculitis, prior infection or demyelinization.  Mental Status & Behavioral Observations: Appearance:Casually and appropriately dressedand groomed.   Gait:Ambulated independently without assistance.  Speech:Clear, normal rate, tone, &volume.  Thought process:Linear & organized. No evidence of delusion/hallucination or bizarre content. Mood & Affect:Mildly anxious but mostly euthymic, appropriate  Interpersonal: Pleasant, appropriate. Orientation: Oriented x 4 Insight/Judgement:Intact  She was cooperative with all assigned tasks and gave excellent effort. She had no difficulty hearing or understanding test instructions and did not require much prompting. She exhibited good frustration tolerance on questions she did not know or tasks that were more difficult.   Tests Administered:  [employer], 3rd Edition, Standard Form  Clock Drawing Test   Wechsler Adult Intelligence Scale-4th Edition (WAIS-IV)  Wechsler Memory Scale, 4th Edition, Adult Battery (WMS-IV-A)  Test Results:   When taking into account the patient's educational and occupational history as well as selected subtest of various elements of the Wechsler scales that tend to be quite stable over time I would estimate the patient's lifelong intellectual and cognitive functioning to be somewhere between the average to high average range of cognitive functioning overall likely following somewhere between 110 and 120 from an index score standpoint.   Composite Score Summary  Scale Sum of Scaled Scores Composite Score Percentile Rank 95% Conf. Interval Qualitative Description  Verbal Comprehension 27 VCI 95 37 90-101 Average  Perceptual Reasoning 29 PRI 98 45 92-104 Average  Working Memory 19 WMI 97 42 90-104 Average  Processing Speed 23 PSI 108 70 99-116 Average  Full Scale 98 FSIQ 98 45 94-102 Average  General Ability 56 GAI 96 39 91-101 Average   Initially, the patient was administered the Wechsler Adult Intelligence Scale-IV.  The patient produced a full scale IQ score of 98 which falls at the 45th percentile and is in the average range.  We  also calculated the patient's general abilities index which puts less weight on elements that can be more acutely affected by various causes including working memory and information processing speed.  The patient's general abilities index score was 96 which falls at the 39th percentile and is in the average range.  Globally, the patient appears to be having some mild decrease in overall cognitive functioning.   Verbal Comprehension Subtests Summary  Subtest Raw Score Scaled Score Percentile Rank Reference Group Scaled Score SEM  Similarities 24 9 37 10 1.04  Vocabulary 42 11 63 12 0.73  Information 0.85   The patient produced a verbal comprehension index score of 95 which falls at the 39th percentile and is in the average range.  There was some variability in subtest performance where the patient performed in the average range with regard to verbal reasoning and problem-solving in her general vocabulary knowledge.  The patient did have significant reductions for her general fund of information but this is likely more reflective of her lifelong interest and focus on very specific types of training and education around her job and occupation.    Perceptual Reasoning Subtests Summary  Subtest Raw Score Scaled Score Percentile Rank Reference Group Scaled Score SEM  Block Design 36 9 37 8 0.99  Matrix Reasoning 21 12 75 12 0.95  Visual Puzzles 1.04  (Figure Weights) 15 10 50 10 0.99  (Picture Completion) 1.20    The patient produced a perceptual  reasoning index score of 98 which falls at the 45th percentile and is in the average range.  There was some variability within subtest performance.  The patient showed average to high average performance with regard to her abilities around visual estimation and judgment, visual reasoning and problem-solving, visual analysis and organization, as well as visual reasoning and problem-solving.  The patient had some focal difficulties  identifying visual anomalies within a visual gestalt.  Clock Drawing Test   Within Normal Limits  A more straightforward visual constructional task at a part of this perceptual reasoning type challenges the patient was also administered the clock drawing test.  The patient's performance on the clock drawing test was within normal limits indicating no indication of visual-spatial/visual constructional deficits.     Working Librarian, academic Raw Score Scaled Score Percentile Rank Reference Group Scaled Score SEM  Digit Span 24 8 25 8  0.73  Arithmetic 16 11 63 11 0.99  (Letter-Number Seq.) 18 9 37 8 1.12    The patient produced a working memory index score of 97 which falls at the 42nd percentile and is in the average range.  There was some variability within subtest performance.  The patient performed at the lower end of the average range with regard to auditory encoding abilities but with more demanding encoding abilities that also included processing elements with that encoding the patient did quite well on both the arithmetic as well as performing in the average range with regard to her more complex letter number sequencing.  This pattern suggest that auditory encoding and working memory are in the average range.    Processing Speed Subtests Summary  Subtest Raw Score Scaled Score Percentile Rank Reference Group Scaled Score SEM  Symbol Search 36 11 63 11 1.56  Coding 81 12 75 12 1.20  (Cancellation) 42 11 63 10 1.62     The patient produced a processing speed index score of 108 which falls at the [facility] and is in the upper end of the average range.  Individual items within this measure showed no variability and they were generally consistent with each other.  The patient did well on measures of visual scanning, visual searching and overall speed of mental operations/focus execute abilities.    Primary Subtest Scaled Score Summary  Subtest Domain Raw Score  Scaled Score Percentile Rank  Logical Memory I AM 29 12 75  Logical Memory II AM 24 11 63  Visual Reproduction I VM 41 12 75  Visual Reproduction II VM 38 14 91  Symbol Span VWM 22 9 37    The patient was administered the spatial span test of the Wechsler Memory Scale's to assess visual encoding/working memory.  The patient performed in the average range on this measure which is consistent with her scores on auditory working memory from the [facility] suggesting that both auditory and visual encoding are within normal limits and not indicative of any significant attentional deficits.  The patient was administered the logical memory and visual reproduction measures from the Wechsler Memory Scale's.  The patient performed quite well on these measures for both immediate recall as well as delayed recall.  Her performances ranged from the 63rd percentile to the 91st percentile on these measures.  She did particularly well on delayed visual memory task but in general her performances were in the average to high average range across these auditory and visual memory challenges.   [place] Verbal [employer], Third Edition (CVLT 3) CVLT  3 Standard Form    Core Score Summary    Immediate Recall Score Raw score Scaled score Percentile rank    Trial 1 Correct 9 14 91    Trial 2 Correct 10 11 63    Trial 3 Correct 13 12 75    Trial 4 Correct 14 12 75    Trial 5 Correct 14 12 75    List B Correct 8 14 91      Delayed Recall Score Raw score Scaled score Percentile rank    Short Delay Free Recall Correct 14 13 84    Short Delay Cued Recall Correct 16 16 98    Long Delay Free Recall Correct 16 16 98    Long Delay Cued Recall Correct 16 15 95      Yes/No Recognition Score Raw score Scaled score Percentile rank    Total Hits 16 13 84    Total False Positives 0 13 84    Recognition Discriminability (d') 4 14 91    Recognition Discriminability Nonparametric 100 14 91       Recall Errors Score Raw score Scaled score Percentile rank    Total Intrusions 1 12 75      Standard Score Summary Index Sum of scaled scores Index score Percentile rank    Trials 1-5 Correct 61 113 81    Delayed Recall Correct 60 127 96    Total Recall Correct 135 122 93      The patient was then administered the [place] Verbal Learning Test.  The patient did exceptionally well on this measure.  This measure requires learning a long list of objects that are presented over successive trials and then a period of delay with free recall of those items as well as recognition recall of those items.  The patient showed effective and efficient learning slope/improvement over repeated exposures to this list with her performance falling in the 63rd percentile for learning over time.  With each individual trial the patient performed equal to or better than normative expectations with no indications of deficits with initial auditory learning.  The patient also did very well on delayed recall measures both short delay is well as long delayed challenges.  The patient's free recall was in the high average range on both short delay and superior range for long delay free recall.  The patient's cued recall also was in the superior range overall with the percentile ranks for delayed recall measures between 84 and 98th percentile.  The patient showed no false positives for recognition challenges and an excellent discriminability between learned elements and non-target words after period of delay.    Summary of Results:   Overall, there were no clear indications of significant or specific patterns of neuropsychological or cognitive deficits although there was a general reduction of global cognitive functioning relative to predicted levels based on her education and occupational history.  The patient generally performed in the average range across a wide range of cognitive indices with her best  performance having to do with her information processing speed and visual scanning/visual searching.  The patient had some difficulties identifying visual anomalies but all other cognitive domains were in the average to high average range relative to a normative population.  The patient also did well on both auditory and visual encoding measures performing in the average range as well small vessel ischemia as doing quite well on measures of focus execute abilities and information processing speed.  As far as memory functions  the patient did well on both visual and auditory learning challenges showing the ability to initially encode information, store and organize that information, retrieve and recall that information after both a period of short delay and extended delay.  There were no indications of specific attentional deficits or memory or learning deficits in either visual or auditory domains.  Impression/Diagnosis:   Overall, the patient generally performed in the average to high average range on almost all cognitive/neuropsychological domains assessed.  While her global for performance was slightly below predicted levels based on her educational occupational history there was no specific or clear pattern of deficits and her scores were generally quite consistent with each other on a broad range of cognitive domains.  The patient showed no specific attentional deficits, memory deficits, visual-spatial deficits, information processing speed deficits, or executive functioning deficits.  The patient does have some abnormalities on her MRI that are generally suspected to be reflecting of her significant migraine history.  These white matter hyperdensities identified may be playing a role in some of her subjective experience of attentional and cognitive difficulties particularly when under stress as they would become more apparent and obvious as the cognitive demands of a situation increase.  However, the  patient reports that she has only been sleeping around 4 hours a night and this chronic sleep deprivation could very well explain her worsening cognitive performance over the past 8 to 12 months.  Issues of depression and anxiety and chronic sleep deprivation could clearly explain her subjective experience of cognitive and attentional deficits and the more demanding the environmental situations are the more affected any white matter involvement from her prior migraine history would be affected.  It does not appear that the patient has any significant degenerative process going on although with her high blood pressure and white matter changes there may be early onset of small vessel disease developing although these MRI findings may be directly related to migraine history that is now being much more effectively treated.  As far as recommendations, I do think that the patient will need to continue to actively treat and manage her migraine history.  The patient is addressing aspects of her depression and stress and is currently taking Lexapro without significant side effects or difficulties.  The patient will need to be particularly vigilant of sustained exercise routines, good dietary habits including vegetables and whole grains, as well as needing to place extra emphasis on sleep hygiene issues.  I do think that sleep hygiene is critically important for the patient giving her symptomatology with both migraine history as well as subjective memory and attentional deficits.  Diagnosis:    Axis I: Sleep deprivation  Cognitive and neurobehavioral dysfunction  Depression with anxiety   Arley PhenixJohn Rodenbough, Psy.D. Neuropsychologist

## 2019-05-02 NOTE — Telephone Encounter (Signed)
We received [employer] approval of Aimovig 140 mg for 12 months 05/01/2019 through 04/29/2020. I faxed approval to pharmacy. Received a receipt of confirmation.

## 2019-05-05 ENCOUNTER — Other Ambulatory Visit: Payer: Self-pay

## 2019-05-05 ENCOUNTER — Encounter: Payer: No Typology Code available for payment source | Attending: Psychology | Admitting: Psychology

## 2019-05-05 DIAGNOSIS — F418 Other specified anxiety disorders: Secondary | ICD-10-CM | POA: Diagnosis not present

## 2019-05-05 DIAGNOSIS — R41840 Attention and concentration deficit: Secondary | ICD-10-CM | POA: Insufficient documentation

## 2019-05-05 DIAGNOSIS — R413 Other amnesia: Secondary | ICD-10-CM | POA: Insufficient documentation

## 2019-05-05 DIAGNOSIS — F0789 Other personality and behavioral disorders due to known physiological condition: Secondary | ICD-10-CM | POA: Diagnosis not present

## 2019-05-05 DIAGNOSIS — Z72820 Sleep deprivation: Secondary | ICD-10-CM

## 2019-05-05 DIAGNOSIS — F09 Unspecified mental disorder due to known physiological condition: Secondary | ICD-10-CM | POA: Diagnosis not present

## 2019-05-08 ENCOUNTER — Ambulatory Visit (INDEPENDENT_AMBULATORY_CARE_PROVIDER_SITE_OTHER): Payer: No Typology Code available for payment source | Admitting: Internal Medicine

## 2019-05-08 ENCOUNTER — Ambulatory Visit: Payer: Self-pay | Admitting: Internal Medicine

## 2019-05-08 ENCOUNTER — Encounter: Payer: Self-pay | Admitting: Internal Medicine

## 2019-05-08 ENCOUNTER — Other Ambulatory Visit (INDEPENDENT_AMBULATORY_CARE_PROVIDER_SITE_OTHER): Payer: No Typology Code available for payment source

## 2019-05-08 DIAGNOSIS — M545 Low back pain, unspecified: Secondary | ICD-10-CM

## 2019-05-08 DIAGNOSIS — J3089 Other allergic rhinitis: Secondary | ICD-10-CM | POA: Diagnosis not present

## 2019-05-08 DIAGNOSIS — R42 Dizziness and giddiness: Secondary | ICD-10-CM

## 2019-05-08 DIAGNOSIS — J309 Allergic rhinitis, unspecified: Secondary | ICD-10-CM | POA: Insufficient documentation

## 2019-05-08 LAB — URINALYSIS, ROUTINE W REFLEX MICROSCOPIC
Bilirubin Urine: NEGATIVE
Hgb urine dipstick: NEGATIVE
Ketones, ur: NEGATIVE
Leukocytes,Ua: NEGATIVE
Nitrite: NEGATIVE
RBC / HPF: NONE SEEN (ref 0–?)
Specific Gravity, Urine: 1.025 (ref 1.000–1.030)
Total Protein, Urine: NEGATIVE
Urine Glucose: NEGATIVE
Urobilinogen, UA: 0.2 (ref 0.0–1.0)
pH: 6 (ref 5.0–8.0)

## 2019-05-08 LAB — COMPREHENSIVE METABOLIC PANEL
ALT: 6 U/L (ref 0–35)
AST: 11 U/L (ref 0–37)
Albumin: 3.8 g/dL (ref 3.5–5.2)
Alkaline Phosphatase: 67 U/L (ref 39–117)
BUN: 14 mg/dL (ref 6–23)
CO2: 27 mEq/L (ref 19–32)
Calcium: 9.1 mg/dL (ref 8.4–10.5)
Chloride: 105 mEq/L (ref 96–112)
Creatinine, Ser: 0.62 mg/dL (ref 0.40–1.20)
GFR: 126.16 mL/min (ref >60.00)
Glucose, Bld: 128 mg/dL — ABNORMAL HIGH (ref 70–99)
Potassium: 3.9 mEq/L (ref 3.5–5.1)
Sodium: 137 mEq/L (ref 135–145)
Total Bilirubin: 0.3 mg/dL (ref 0.2–1.2)
Total Protein: 7.4 g/dL (ref 6.0–8.3)

## 2019-05-08 LAB — CBC
HCT: 33.3 % — ABNORMAL LOW (ref 36.0–46.0)
Hemoglobin: 10.7 g/dL — ABNORMAL LOW (ref 12.0–15.0)
MCHC: 32 g/dL (ref 30.0–36.0)
MCV: 79.3 fl (ref 78.0–100.0)
Platelets: 225 10*3/uL (ref 150.0–400.0)
RBC: 4.2 Mil/uL (ref 3.87–5.11)
RDW: 14.7 % (ref 11.5–15.5)
WBC: 5.7 10*3/uL (ref 4.0–10.5)

## 2019-05-08 NOTE — Telephone Encounter (Signed)
Appointment for a Doxy has been made for today with Dr. Sharlet Salina.

## 2019-05-08 NOTE — Assessment & Plan Note (Signed)
Given instructions on epley maneuver. Likely BPPV and advised to take allergy medication as this may help also.

## 2019-05-08 NOTE — Telephone Encounter (Signed)
Noted  

## 2019-05-08 NOTE — Telephone Encounter (Signed)
Pt reports "Off balance since yesterday." States "Maybe spinning, not sure." States worse when first standing, resolves "After walking for a while." Reports nose "Stuffy, like I have a lot of boogers in it." BP checked during call, 132/78 HR 90.  Afebrile, denies headache, no visual changes, no weakness, no CP or SOB. Also reports lower back pain, across entire lower back. Sharp, shooting, x 3 days. Denies any dysuria, denies urgency, frequency.  Call transferred to practice, [place], for consideration of appt today.  Reason for Disposition . [1] MODERATE dizziness (e.g., interferes with normal activities) AND [2] has NOT been evaluated by physician for this  (Exception: dizziness caused by heat exposure, sudden standing, or poor fluid intake)  Answer Assessment - Initial Assessment Questions 1. DESCRIPTION: "Describe your dizziness."     "Just off balance" 2. LIGHTHEADED: "Do you feel lightheaded?" (e.g., somewhat faint, woozy, weak upon standing)     :Not sure" 3. VERTIGO: "Do you feel like either you or the room is spinning or tilting?" (i.e. vertigo)     "Not sure" 4. SEVERITY: "How bad is it?"  "Do you feel like you are going to faint?" "Can you stand and walk?"   - MILD - walking normally   - MODERATE - interferes with normal activities (e.g., work, school)    - SEVERE - unable to stand, requires support to walk, feels like passing out now.     moderate 5. ONSET:  "When did the dizziness begin?"     3 days 6. AGGRAVATING FACTORS: "Does anything make it worse?" (e.g., standing, change in head position)    Resolves mostly after walking for a while 7. HEART RATE: "Can you tell me your heart rate?" "How many beats in 15 seconds?"  (Note: not all patients can do this)       *No Answer* 8. CAUSE: "What do you think is causing the dizziness?"     Not sure 9. RECURRENT SYMPTOM: "Have you had dizziness before?" If so, ask: "When was the last time?" "What happened that time?"     no 10.  OTHER SYMPTOMS: "Do you have any other symptoms?" (e.g., fever, chest pain, vomiting, diarrhea, bleeding)  Protocols used: DIZZINESS Maria Wiley

## 2019-05-08 NOTE — Progress Notes (Signed)
Virtual Visit via Video Note  I connected with Corie Chiquito on 05/08/19 at  9:40 AM EDT by a video enabled telemedicine application and verified that I am speaking with the correct person using two identifiers.  The patient and the provider were at separate locations throughout the entire encounter.   I discussed the limitations of evaluation and management by telemedicine and the availability of in person appointments. The patient expressed understanding and agreed to proceed.  History of Present Illness: The patient is a 43 y.o. female with visit for several concerns including dizziness (when standing and walking she gets off balance and feels like room is spinning, worse with bending down to tie shoelaces, denies ear pain or fevers or chills, does have allergies which are present but no pain in the cheek sinuses or around eyes, denies recent cold, started about 1-2 days ago, overall stable) and sinus congestion (gets seasonal allergies about this time of year, going on for a week or two, denies fevers or chills, denies muscle aches, denies sinus pressure, has chronic headaches which are not worse recently) and low back pain (started about 2-3 days ago, denies dysuria or frequency, denies fevers or chills, denies blood in urine, denies SOB or chest pain, denies radiation of the pain to her legs or numbness or weakness in the legs).  Observations/Objective: Appearance: normal, breathing appears normal, no coughing during visit, casual grooming, abdomen does not appear distended, pain bilateral lumbar region to self palpation, no radiation on self palpation, throat normal, memory normal, mental status is A and O times 3  Assessment and Plan: See problem oriented charting  Follow Up Instructions: checking CBC, CMP, U/A and culture, given epley maneuver instructions  I discussed the assessment and treatment plan with the patient. The patient was provided an opportunity to ask questions and  all were answered. The patient agreed with the plan and demonstrated an understanding of the instructions.   The patient was advised to call back or seek an in-person evaluation if the symptoms worsen or if the condition fails to improve as anticipated.  Hoyt Koch, MD

## 2019-05-08 NOTE — Assessment & Plan Note (Signed)
Advised to start zyrtec. Does not sound typical for covid-19 and likely not related. No indication for antibiotics.

## 2019-05-08 NOTE — Assessment & Plan Note (Signed)
Checking CBC, CMP, U/A and urine culture to rule out infection. Likely muscular or related to her ankylosing spondylitis.

## 2019-05-09 ENCOUNTER — Other Ambulatory Visit: Payer: Self-pay | Admitting: Family

## 2019-05-09 ENCOUNTER — Encounter: Payer: Self-pay | Admitting: Internal Medicine

## 2019-05-09 ENCOUNTER — Other Ambulatory Visit: Payer: Self-pay | Admitting: Internal Medicine

## 2019-05-09 DIAGNOSIS — E039 Hypothyroidism, unspecified: Secondary | ICD-10-CM

## 2019-05-09 MED ORDER — VESICARE 10 MG PO TABS: 10.0000 mg | ORAL_TABLET | Freq: Every day | ORAL | 3 refills | Status: DC

## 2019-05-09 MED FILL — ESCITALOPRAM 20 MG TABLET: 20 | 90 days supply | Qty: 45 | Fill #0

## 2019-05-09 MED FILL — LEVOTHYROXINE 25 MCG TABLET: 25 | 90 days supply | Qty: 90 | Fill #0

## 2019-05-09 MED FILL — AMLODIPINE BESYLATE 5 MG TA: 5 | 90 days supply | Qty: 90 | Fill #0

## 2019-05-09 NOTE — Progress Notes (Signed)
[facility]    Provider:  Dr Lucia Gaskins Requesting Provider: Myrlene Broker, * Primary Care Provider:  Myrlene Broker, MD  CC:  Headache  Interval history 05/10/2019: Patient here for follow up for migraines. The Aimovig is working. When she has a migraine she takes excedrin. She is not taking it often.  She saw Dr. Kieth Brightly for her memory and they had a long talk. She wakes with migraines. This may be rebound headache from caffeine. She does not snore but will check with husband.  She wsa having migraines at least 15 days a month and reduced by > 50%. Diagnosis For neuropsych testing was sleep deprivation.  Reviewed MRI images with patient, discussed white matter changes, watching vascular risk factors.   HPI:  Maria Wiley is a 43 y.o. female here as requested by Myrlene Broker, * for headache. PMHx IBS, hypothyroidism, RA, Ankylosing Spondylitis, OA, headache. She has a hx of migraines. 2-3 months ago she started forgetting things. She has to write everything down. She saw her pcp and recommended the MRI of the brain. She has some right hand tremors. The worse is the memory loss. No head trauma. No new medications. Probably ongoing longer than 2-3 months, wrote it off as getting older. She doesn't remember both remote and short-term memory. Her son has DM1 but has it under control so no stress. Family is noticing. At work she kees detailed notes so she doesn't forget, she is scared people will notice. She has to make lists and if she doesn't make the list she wont et anything done. She doesn't remember why she walks into stores. She may zone out, she thought it was attention maybe ADHD. She doesn't remember everything she thinks she should. She has missed some bills, she has a "bill book" and has forgotten to pay for something. No Hx of learning disabilities, did well in school. She can manage money, days seem to run together but she usually knows  what day and month and year it is. Her family says she repeats things a lot. She has 4 boys. She sleeps fairly well, she has a lot of stress at work, she wakes up at 1am-4am, she can;t get a complete night's sleep but this new. She snores. She wakes up sometimes. She feels tired in the morning but she can still function. They just opened up a new clinic in [place] at work and this was stressful. She has anxiety, she doesn't ever unwind, she can;t just sit down and relax. She is on Lexapro for anxiety. She wakes up with headaches. Migraines starte 20+ years ago. Recently worsening, she cn wake up with pounding, on the left side of the head, they can be positional. She keeps bottles everywhere, 3-4 migraines a week. +light and sound and noise sensitivity. Migraines bad the last few years. 22 headache days a month, 12 migraine days, migraines can last 24-72 hours, no nausea or vomiting, she has episodes of black missing vision spots. At least 6 months at this frequency. No aura. [facility] rheumatology.  Medications tried: Flexeril, lexapro, Topiramate contraindicated, tried nortriptyline  Reviewed notes, labs and imaging from outside physicians, which showed:  Labs 10/22/2017: RF 9.8, CCP < 1, CCP Igg neg, crp <5, CBC with mild anemia hgb 10.9, CMP normal BUN 9, creat 0.62, sed 25, hgba1c 5.7, TSH nml, ldl 116, b12 245  Personally reviewed MRI of the brain images and agree with the following:  1.  No acute intracranial  abnormality. 2. Abnormal but nonspecific signal changes in the cerebral white matter, most notably in the right middle frontal gyrus and the posterior body of the corpus callosum. Top differential considerations include accelerated/hereditary small vessel ischemia, sequelae of trauma, hypercoagulable state, vasculitis, prior infection or demyelination.  hgba1c 5.7, cmp nml, cbc mild anemia. B12 245(should check MMA)  Review of Systems: Patient complains of symptoms per HPI as well as  the following symptoms: headaches, memory loss, cognitive changes, confusion, sleep problems. Pertinent negatives and positives per HPI. All others negative.   Social History   Socioeconomic History  . Marital status: Married    Spouse name: Not on file  . Number of children: 4  . Years of education: Not on file  . Highest education level: Bachelor's degree (e.g., BA, AB, BS)  Occupational History  . Not on file  Social Needs  . Financial resource strain: Not on file  . Food insecurity    Worry: Not on file    Inability: Not on file  . Transportation needs    Medical: Not on file    Non-medical: Not on file  Tobacco Use  . Smoking status: Never Smoker  . Smokeless tobacco: Never Used  Substance and Sexual Activity  . Alcohol use: Never    Frequency: Never  . Drug use: Never  . Sexual activity: Yes    Birth control/protection: Surgical  Lifestyle  . Physical activity    Days per week: Not on file    Minutes per session: Not on file  . Stress: Not on file  Relationships  . Social Musicianconnections    Talks on phone: Not on file    Gets together: Not on file    Attends religious service: Not on file    Active member of club or organization: Not on file    Attends meetings of clubs or organizations: Not on file    Relationship status: Not on file  . Intimate partner violence    Fear of current or ex partner: Not on file    Emotionally abused: Not on file    Physically abused: Not on file    Forced sexual activity: Not on file  Other Topics Concern  . Not on file  Social History Narrative   Lives at home with her husband & her 4 sons   Right handed   Caffeine: 2-3 cups per day    Family History  Problem Relation Age of Onset  . CAD Maternal Grandfather   . Diabetes Maternal Grandfather   . Hearing loss Maternal Grandfather   . Heart disease Maternal Grandfather   . Hyperlipidemia Maternal Grandfather   . Hypertension Mother   . Arthritis Mother   . Arthritis  Maternal Grandmother   . Heart disease Maternal Grandmother   . Hyperlipidemia Maternal Grandmother   . Migraines Neg Hx   . Headache Neg Hx     Past Medical History:  Diagnosis Date  . Ankylosing spondylitis (HCC)   . Chest pain   . GERD (gastroesophageal reflux disease)   . Hypertension   . Migraines   . RA (rheumatoid arthritis) (HCC)   . Thyroid disease   . Urinary incontinence     Patient Active Problem List   Diagnosis Date Noted  . Chronic migraine without aura without status migrainosus, not intractable 05/10/2019  . Sleep deprivation 05/10/2019  . Vertigo 05/08/2019  . Allergic rhinitis 05/08/2019  . Acute bilateral low back pain without sciatica 05/08/2019  . Essential hypertension  03/10/2019  . Attention and concentration deficit 01/24/2019  . Abdominal pain 11/16/2018  . Weakness 10/11/2018  . New daily persistent headache 10/11/2018  . Hair loss 02/25/2018  . Ankylosing spondylitis (HCC) 01/15/2018  . OA (osteoarthritis) of knee 01/15/2018  . Routine general medical examination at a health care facility 01/15/2018  . IBS (irritable bowel syndrome) 01/15/2018  . RA (rheumatoid arthritis) (HCC)   . Hypothyroidism     Past Surgical History:  Procedure Laterality Date  . ABDOMINAL HYSTERECTOMY    . APPENDECTOMY    . CHOLECYSTECTOMY    . HERNIA REPAIR  2004  . KNEE SURGERY Right 2006    Current Outpatient Medications  Medication Sig Dispense Refill  . amLODipine (NORVASC) 5 MG tablet TAKE 1 TABLET (5 MG TOTAL) BY MOUTH DAILY. 90 tablet 3  . cholecalciferol (VITAMIN D) 1000 units tablet Take 1,000 Units by mouth daily.    [name] (AIMOVIG) 140 MG/ML SOAJ Inject 140 mg into the skin every 30 (thirty) days. 1 pen 11  . escitalopram (LEXAPRO) 20 MG tablet TAKE 1/2 TABLETS BY MOUTH ONCE A DAY 45 tablet 3  . etanercept (ENBREL SURECLICK) 50 MG/ML injection Inject 0.98 mLs (50 mg total) into the skin once a week. 12 pen 0  . folic acid (FOLVITE) 1 MG  tablet Take 1 tablet (1 mg total) by mouth daily. 90 tablet 3  . leflunomide (ARAVA) 20 MG tablet Take 20 mg by mouth daily.    Marland Kitchen levothyroxine (SYNTHROID) 25 MCG tablet TAKE 1 TABLET BY MOUTH DAILY BEFORE BREAKFAST 90 tablet 3  . LINZESS 145 MCG CAPS capsule Take 145 mcg by mouth daily as needed.     . Magnesium 250 MG TABS Take 250 mg by mouth daily.    . ondansetron (ZOFRAN) 4 MG tablet Take 1 tablet (4 mg total) by mouth every 8 (eight) hours as needed for nausea or vomiting. 6 tablet 0  . VESICARE 10 MG tablet Take 1 tablet (10 mg total) by mouth daily. 90 tablet 3  . ondansetron (ZOFRAN-ODT) 4 MG disintegrating tablet Take 1 tablet (4 mg total) by mouth every 8 (eight) hours as needed for nausea. 30 tablet 3  . rizatriptan (MAXALT-MLT) 10 MG disintegrating tablet Take 1 tablet (10 mg total) by mouth as needed for migraine. May repeat in 2 hours if needed 9 tablet 11  . tretinoin (RETIN-A) 0.025 % cream APPLY AA QHS    . valACYclovir (VALTREX) 1000 MG tablet Take 1 tablet (1,000 mg total) by mouth 2 (two) times daily. (Patient not taking: Reported on 05/10/2019) 60 tablet 3   No current facility-administered medications for this visit.     Allergies as of 05/10/2019  . (No Known Allergies)    Vitals: BP 124/86 (BP Location: Right Arm, Patient Position: Sitting)   Pulse 68   Temp (!) 97.3 F (36.3 C)   Ht 5\' 7"  (1.702 m)   Wt 226 lb (102.5 kg)   BMI 35.40 kg/m  Last Weight:  Wt Readings from Last 1 Encounters:  05/10/19 226 lb (102.5 kg)   Last Height:   Ht Readings from Last 1 Encounters:  05/10/19 5\' 7"  (1.702 m)    Physical exam: Exam: Gen: NAD, conversant, well nourised, obese, well groomed                     CV: RRR, no MRG. No Carotid Bruits. No peripheral edema, warm, nontender Eyes: Conjunctivae clear without exudates or hemorrhage  Neuro: Detailed Neurologic Exam  Speech:    Speech is normal; fluent and spontaneous with normal comprehension.  Cognition:     The patient is oriented to person, place, and time;     recent and remote memory intact;     language fluent;     normal attention, concentration,     fund of knowledge Cranial Nerves:    The pupils are equal, round, and reactive to light. The fundi are normal and spontaneous venous pulsations are present. Visual fields are full to finger confrontation. Extraocular movements are intact. Trigeminal sensation is intact and the muscles of mastication are normal. The face is symmetric. The palate elevates in the midline. Hearing intact. Voice is normal. Shoulder shrug is normal. The tongue has normal motion without fasciculations.   Coordination:    Normal finger to nose and heel to shin. Normal rapid alternating movements.   Gait:    Heel-toe and tandem gait are normal.   Motor Observation:    No asymmetry, no atrophy, and no involuntary movements noted. Tone:    Normal muscle tone.    Posture:    Posture is normal. normal erect    Strength:    Strength is V/V in the upper and lower limbs.      Sensation: intact to LT     Reflex Exam:  DTR's:    Deep tendon reflexes in the upper and lower extremities are normal bilaterally.   Toes:    The toes are downgoing bilaterally.   Clonus:    Clonus is absent.    Assessment/Plan:  43 year old with chronic migraines, improved   MRI of the brain: stable reviewed with patient.  CTA neg Formal memory testing dxed with sleep deprivation Continue Aimovig. Discussed botox or adding topamax if needed Start maxalt acutely with zofran  Sleep deprivation/anxiety:  Discussed taking care of herself, stress in her life.Engaging in exercise, meditation, weight loss and focusing more on her health. Continue Lexapro. stable    No orders of the defined types were placed in this encounter.  Meds ordered this encounter  Medications  . rizatriptan (MAXALT-MLT) 10 MG disintegrating tablet    Sig: Take 1 tablet (10 mg total) by mouth as needed  for migraine. May repeat in 2 hours if needed    Dispense:  9 tablet    Refill:  11  . ondansetron (ZOFRAN-ODT) 4 MG disintegrating tablet    Sig: Take 1 tablet (4 mg total) by mouth every 8 (eight) hours as needed for nausea.    Dispense:  30 tablet    Refill:  3   Discussed: To prevent or relieve headaches, try the following: Cool Compress. Lie down and place a cool compress on your head.  Avoid headache triggers. If certain foods or odors seem to have triggered your migraines in the past, avoid them. A headache diary might help you identify triggers.  Include physical activity in your daily routine. Try a daily walk or other moderate aerobic exercise.  Manage stress. Find healthy ways to cope with the stressors, such as delegating tasks on your to-do list.  Practice relaxation techniques. Try deep breathing, yoga, massage and visualization.  Eat regularly. Eating regularly scheduled meals and maintaining a healthy diet might help prevent headaches. Also, drink plenty of fluids.  Follow a regular sleep schedule. Sleep deprivation might contribute to headaches Consider biofeedback. With this mind-body technique, you learn to control certain bodily functions - such as muscle tension, heart rate and blood  pressure - to prevent headaches or reduce headache pain.    Proceed to emergency room if you experience new or worsening symptoms or symptoms do not resolve, if you have new neurologic symptoms or if headache is severe, or for any concerning symptom.   Provided education and documentation from American headache Society toolbox including articles on: chronic migraine medication overuse headache, chronic migraines, prevention of migraines, behavioral and other nonpharmacologic treatments for headache.   Cc: Pricilla Holm A, *,    A total of 25 minutes was spent face-to-face with this patient. Over half this time was spent on counseling patient on the  1. Chronic migraine without aura  without status migrainosus, not intractable   2. Sleep deprivation    diagnosis and different diagnostic and therapeutic options, counseling and coordination of care, risks ans benefits of management, compliance, or risk factor reduction and education.    Sarina Ill, MD  [facility] [address] [place],  [address]  Phone [phone] Fax [phone]

## 2019-05-10 ENCOUNTER — Other Ambulatory Visit: Payer: Self-pay | Admitting: Internal Medicine

## 2019-05-10 ENCOUNTER — Ambulatory Visit (INDEPENDENT_AMBULATORY_CARE_PROVIDER_SITE_OTHER): Payer: No Typology Code available for payment source | Admitting: Neurology

## 2019-05-10 ENCOUNTER — Encounter: Payer: Self-pay | Admitting: Neurology

## 2019-05-10 ENCOUNTER — Other Ambulatory Visit: Payer: Self-pay

## 2019-05-10 VITALS — BP 124/86 | HR 68 | Temp 97.3°F | Ht 67.0 in | Wt 226.0 lb

## 2019-05-10 DIAGNOSIS — Z72820 Sleep deprivation: Secondary | ICD-10-CM | POA: Diagnosis not present

## 2019-05-10 DIAGNOSIS — D649 Anemia, unspecified: Secondary | ICD-10-CM

## 2019-05-10 DIAGNOSIS — G43709 Chronic migraine without aura, not intractable, without status migrainosus: Secondary | ICD-10-CM | POA: Diagnosis not present

## 2019-05-10 DIAGNOSIS — Z1231 Encounter for screening mammogram for malignant neoplasm of breast: Secondary | ICD-10-CM

## 2019-05-10 LAB — URINE CULTURE
MICRO NUMBER:: 954167
SPECIMEN QUALITY:: ADEQUATE

## 2019-05-10 MED ORDER — ONDANSETRON 4 MG PO TBDP: 4.0000 mg | ORAL_TABLET | Freq: Three times a day (TID) | ORAL | 3 refills | Status: DC | PRN

## 2019-05-10 MED ORDER — RIZATRIPTAN BENZOATE 10 MG PO TBDP: 10.0000 mg | ORAL_TABLET | ORAL | 11 refills | Status: DC | PRN

## 2019-05-10 MED FILL — RIZATRIPTAN 10 MG ODT: 10 | 30 days supply | Qty: 9 | Fill #0

## 2019-05-10 MED FILL — ONDANSETRON ODT 4 MG TABLET: 4 | 10 days supply | Qty: 30 | Fill #0

## 2019-05-10 NOTE — Patient Instructions (Signed)
Maxalt(Rizatriptan): Please take one tablet at the onset of your headache. If it does not improve the symptoms please take one additional tablet. Do not take more then 2 tablets in 24hrs. Do not take use more then 2 to 3 times in a week.  Zofran: May take with maxalt for nausea  Ondansetron tablets What is this medicine? ONDANSETRON (on DAN se tron) is used to treat nausea and vomiting caused by chemotherapy. It is also used to prevent or treat nausea and vomiting after surgery. This medicine may be used for other purposes; ask your health care provider or pharmacist if you have questions. COMMON BRAND NAME(S): Zofran What should I tell my health care provider before I take this medicine? They need to know if you have any of these conditions:  heart disease  history of irregular heartbeat  liver disease  low levels of magnesium or potassium in the blood  an unusual or allergic reaction to ondansetron, granisetron, other medicines, foods, dyes, or preservatives  pregnant or trying to get pregnant  breast-feeding How should I use this medicine? Take this medicine by mouth with a glass of water. Follow the directions on your prescription label. Take your doses at regular intervals. Do not take your medicine more often than directed. Talk to your pediatrician regarding the use of this medicine in children. Special care may be needed. Overdosage: If you think you have taken too much of this medicine contact a poison control center or emergency room at once. NOTE: This medicine is only for you. Do not share this medicine with others. What if I miss a dose? If you miss a dose, take it as soon as you can. If it is almost time for your next dose, take only that dose. Do not take double or extra doses. What may interact with this medicine? Do not take this medicine with any of the following medications:  apomorphine  certain medicines for fungal infections like fluconazole, itraconazole,  ketoconazole, posaconazole, voriconazole  cisapride  dronedarone  pimozide  thioridazine This medicine may also interact with the following medications:  carbamazepine  certain medicines for depression, anxiety, or psychotic disturbances  fentanyl  linezolid  MAOIs like Carbex, Eldepryl, Marplan, Nardil, and Parnate  methylene blue (injected into a vein)  other medicines that prolong the QT interval (cause an abnormal heart rhythm) like dofetilide, ziprasidone  phenytoin  rifampicin  tramadol This list may not describe all possible interactions. Give your health care provider a list of all the medicines, herbs, non-prescription drugs, or dietary supplements you use. Also tell them if you smoke, drink alcohol, or use illegal drugs. Some items may interact with your medicine. What should I watch for while using this medicine? Check with your doctor or health care professional right away if you have any sign of an allergic reaction. What side effects may I notice from receiving this medicine? Side effects that you should report to your doctor or health care professional as soon as possible:  allergic reactions like skin rash, itching or hives, swelling of the face, lips or tongue  breathing problems  confusion  dizziness  fast or irregular heartbeat  feeling faint or lightheaded, falls  fever and chills  loss of balance or coordination  seizures  sweating  swelling of the hands or feet  tightness in the chest  tremors  unusually weak or tired Side effects that usually do not require medical attention (report to your doctor or health care professional if they continue or  are bothersome):  constipation or diarrhea  headache This list may not describe all possible side effects. Call your doctor for medical advice about side effects. You may report side effects to FDA at 1-800-FDA-1088. Where should I keep my medicine? Keep out of the reach of  children. Store between 2 and 30 degrees C (36 and 86 degrees F). Throw away any unused medicine after the expiration date. NOTE: This sheet is a summary. It may not cover all possible information. If you have questions about this medicine, talk to your doctor, pharmacist, or health care provider.  2020 Elsevier/Gold Standard (2018-07-12 07:16:43)  Rizatriptan tablets What is this medicine? RIZATRIPTAN (rye za TRIP tan) is used to treat migraines with or without aura. An aura is a strange feeling or visual disturbance that warns you of an attack. It is not used to prevent migraines. This medicine may be used for other purposes; ask your health care provider or pharmacist if you have questions. COMMON BRAND NAME(S): Maxalt What should I tell my health care provider before I take this medicine? They need to know if you have any of these conditions:  cigarette smoker  circulation problems in fingers and toes  diabetes  heart disease  high blood pressure  high cholesterol  history of irregular heartbeat  history of stroke  kidney disease  liver disease  stomach or intestine problems  an unusual or allergic reaction to rizatriptan, other medicines, foods, dyes, or preservatives  pregnant or trying to get pregnant  breast-feeding How should I use this medicine? Take this medicine by mouth with a glass of water. Follow the directions on the prescription label. Do not take it more often than directed. Talk to your pediatrician regarding the use of this medicine in children. While this drug may be prescribed for children as young as 6 years for selected conditions, precautions do apply. Overdosage: If you think you have taken too much of this medicine contact a poison control center or emergency room at once. NOTE: This medicine is only for you. Do not share this medicine with others. What if I miss a dose? This does not apply. This medicine is not for regular use. What may  interact with this medicine? Do not take this medicine with any of the following medicines:  certain medicines for migraine headache like almotriptan, eletriptan, frovatriptan, naratriptan, rizatriptan, sumatriptan, zolmitriptan  ergot alkaloids like dihydroergotamine, ergonovine, ergotamine, methylergonovine  MAOIs like Carbex, Eldepryl, Marplan, Nardil, and Parnate This medicine may also interact with the following medications:  certain medicines for depression, anxiety, or psychotic disorders  propranolol This list may not describe all possible interactions. Give your health care provider a list of all the medicines, herbs, non-prescription drugs, or dietary supplements you use. Also tell them if you smoke, drink alcohol, or use illegal drugs. Some items may interact with your medicine. What should I watch for while using this medicine? Visit your healthcare professional for regular checks on your progress. Tell your healthcare professional if your symptoms do not start to get better or if they get worse. You may get drowsy or dizzy. Do not drive, use machinery, or do anything that needs mental alertness until you know how this medicine affects you. Do not stand up or sit up quickly, especially if you are an older patient. This reduces the risk of dizzy or fainting spells. Alcohol may interfere with the effect of this medicine. Your mouth may get dry. Chewing sugarless gum or sucking hard candy and drinking  plenty of water may help. Contact your healthcare professional if the problem does not go away or is severe. If you take migraine medicines for 10 or more days a month, your migraines may get worse. Keep a diary of headache days and medicine use. Contact your healthcare professional if your migraine attacks occur more frequently. What side effects may I notice from receiving this medicine? Side effects that you should report to your doctor or health care professional as soon as  possible:  allergic reactions like skin rash, itching or hives, swelling of the face, lips, or tongue  chest pain or chest tightness  signs and symptoms of a dangerous change in heartbeat or heart rhythm like chest pain; dizziness; fast, irregular heartbeat; palpitations; feeling faint or lightheaded; falls; breathing problems  signs and symptoms of a stroke like changes in vision; confusion; trouble speaking or understanding; severe headaches; sudden numbness or weakness of the face, arm or leg; trouble walking; dizziness; loss of balance or coordination  signs and symptoms of serotonin syndrome like irritable; confusion; diarrhea; fast or irregular heartbeat; muscle twitching; stiff muscles; trouble walking; sweating; high fever; seizures; chills; vomiting Side effects that usually do not require medical attention (report to your doctor or health care professional if they continue or are bothersome):  diarrhea  dizziness  drowsiness  dry mouth  headache  nausea, vomiting  pain, tingling, numbness in the hands or feet  stomach pain This list may not describe all possible side effects. Call your doctor for medical advice about side effects. You may report side effects to FDA at 1-800-FDA-1088. Where should I keep my medicine? Keep out of the reach of children. Store at room temperature between 15 and 30 degrees C (59 and 86 degrees F). Keep container tightly closed. Throw away any unused medicine after the expiration date. NOTE: This sheet is a summary. It may not cover all possible information. If you have questions about this medicine, talk to your doctor, pharmacist, or health care provider.  2020 Elsevier/Gold Standard (2018-02-01 14:59:59)

## 2019-05-11 ENCOUNTER — Telehealth: Payer: Self-pay

## 2019-05-11 MED FILL — SOLIFENACIN SUCCINATE 10 MG: 10 | 90 days supply | Qty: 90 | Fill #0

## 2019-05-11 NOTE — Telephone Encounter (Signed)
Okay for generic

## 2019-05-11 NOTE — Telephone Encounter (Signed)
Copied from [place] [id number]. Topic: General - Inquiry >> May 11, 2019  2:51 PM Mathis Bud wrote: Reason for CRM: [facility] called regarding about medication VESICARE 10 MG tablet. The PA was denied and pharmacy wants to know if they can get the generic brand  Call back [phone]

## 2019-05-11 NOTE — Telephone Encounter (Signed)
Pharmacy informed of Md approval

## 2019-06-06 ENCOUNTER — Encounter: Payer: Self-pay | Admitting: Psychology

## 2019-06-06 NOTE — Progress Notes (Signed)
Today I provided feedback regarding the results of the recent neuropsychological evaluation.  The full report can be found on the patient's EMR dated 05/02/2019.  This was a 1 hour appointment that consisted of myself along with the patient in an in person visit that was conducted in my outpatient clinic office.  Below you will find the summary and results from that full evaluation.  Summary of Results:                        Overall, there were no clear indications of significant or specific patterns of neuropsychological or cognitive deficits although there was a general reduction of global cognitive functioning relative to predicted levels based on her education and occupational history.  The patient generally performed in the average range across a wide range of cognitive indices with her best performance having to do with her information processing speed and visual scanning/visual searching.  The patient had some difficulties identifying visual anomalies but all other cognitive domains were in the average to high average range relative to a normative population.  The patient also did well on both auditory and visual encoding measures performing in the average range as well small vessel ischemia as doing quite well on measures of focus execute abilities and information processing speed.  As far as memory functions the patient did well on both visual and auditory learning challenges showing the ability to initially encode information, store and organize that information, retrieve and recall that information after both a period of short delay and extended delay.  There were no indications of specific attentional deficits or memory or learning deficits in either visual or auditory domains.  Impression/Diagnosis:                     Overall, the patient generally performed in the average to high average range on almost all cognitive/neuropsychological domains assessed.  While her global for performance was  slightly below predicted levels based on her educational occupational history there was no specific or clear pattern of deficits and her scores were generally quite consistent with each other on a broad range of cognitive domains.  The patient showed no specific attentional deficits, memory deficits, visual-spatial deficits, information processing speed deficits, or executive functioning deficits.  The patient does have some abnormalities on her MRI that are generally suspected to be reflecting of her significant migraine history.  These white matter hyperdensities identified may be playing a role in some of her subjective experience of attentional and cognitive difficulties particularly when under stress as they would become more apparent and obvious as the cognitive demands of a situation increase.  However, the patient reports that she has only been sleeping around 4 hours a night and this chronic sleep deprivation could very well explain her worsening cognitive performance over the past 8 to 12 months.  Issues of depression and anxiety and chronic sleep deprivation could clearly explain her subjective experience of cognitive and attentional deficits and the more demanding the environmental situations are the more affected any white matter involvement from her prior migraine history would be affected.  It does not appear that the patient has any significant degenerative process going on although with her high blood pressure and white matter changes there may be early onset of small vessel disease developing although these MRI findings may be directly related to migraine history that is now being much more effectively treated.  As far as recommendations, I do think  that the patient will need to continue to actively treat and manage her migraine history.  The patient is addressing aspects of her depression and stress and is currently taking Lexapro without significant side effects or difficulties.  The  patient will need to be particularly vigilant of sustained exercise routines, good dietary habits including vegetables and whole grains, as well as needing to place extra emphasis on sleep hygiene issues.  I do think that sleep hygiene is critically important for the patient giving her symptomatology with both migraine history as well as subjective memory and attentional deficits.  Diagnosis:                               Axis I: Sleep deprivation  Cognitive and neurobehavioral dysfunction  Depression with anxiety   Ilean Skill, Psy.D. Neuropsychologist

## 2019-06-08 ENCOUNTER — Ambulatory Visit: Payer: No Typology Code available for payment source | Admitting: Psychology

## 2019-06-12 ENCOUNTER — Ambulatory Visit: Payer: No Typology Code available for payment source | Admitting: Psychology

## 2019-06-13 MED FILL — ENBREL SURECLICK 50 MG/ML S: 50 | 28 days supply | Qty: 4 | Fill #1

## 2019-06-13 MED FILL — RIZATRIPTAN 10 MG ODT: 10 | 30 days supply | Qty: 9 | Fill #1

## 2019-06-13 MED FILL — AIMOVIG 140 MG/ML SOAJ: 140 | 30 days supply | Qty: 1 | Fill #1

## 2019-06-21 ENCOUNTER — Encounter: Payer: Self-pay | Admitting: Internal Medicine

## 2019-06-22 MED ORDER — ESCITALOPRAM OXALATE 20 MG PO TABS: 20.0000 mg | ORAL_TABLET | Freq: Every day | ORAL | 3 refills | Status: DC

## 2019-06-26 ENCOUNTER — Ambulatory Visit
Admission: RE | Admit: 2019-06-26 | Discharge: 2019-06-26 | Disposition: A | Discharge status: Home / Self Care | Payer: No Typology Code available for payment source | Source: Ambulatory Visit | Attending: Internal Medicine | Admitting: Internal Medicine

## 2019-06-26 ENCOUNTER — Other Ambulatory Visit: Payer: Self-pay

## 2019-06-26 DIAGNOSIS — Z1231 Encounter for screening mammogram for malignant neoplasm of breast: Secondary | ICD-10-CM

## 2019-06-27 MED FILL — ESCITALOPRAM 20 MG TABLET: 20 | 90 days supply | Qty: 90 | Fill #0

## 2019-06-28 ENCOUNTER — Other Ambulatory Visit: Payer: Self-pay | Admitting: Internal Medicine

## 2019-06-28 DIAGNOSIS — R928 Other abnormal and inconclusive findings on diagnostic imaging of breast: Secondary | ICD-10-CM

## 2019-07-02 ENCOUNTER — Encounter: Payer: Self-pay | Admitting: Internal Medicine

## 2019-07-05 ENCOUNTER — Ambulatory Visit
Admission: RE | Admit: 2019-07-05 | Discharge: 2019-07-05 | Disposition: A | Discharge status: Home / Self Care | Payer: No Typology Code available for payment source | Source: Ambulatory Visit | Attending: Internal Medicine | Admitting: Internal Medicine

## 2019-07-05 ENCOUNTER — Other Ambulatory Visit: Payer: Self-pay

## 2019-07-05 DIAGNOSIS — R928 Other abnormal and inconclusive findings on diagnostic imaging of breast: Secondary | ICD-10-CM

## 2019-07-07 ENCOUNTER — Ambulatory Visit
Admission: EM | Admit: 2019-07-07 | Discharge: 2019-07-07 | Disposition: A | Discharge status: Home / Self Care | Payer: No Typology Code available for payment source | Attending: Emergency Medicine | Admitting: Emergency Medicine

## 2019-07-07 ENCOUNTER — Other Ambulatory Visit: Payer: Self-pay

## 2019-07-07 DIAGNOSIS — Z20828 Contact with and (suspected) exposure to other viral communicable diseases: Secondary | ICD-10-CM | POA: Diagnosis not present

## 2019-07-07 DIAGNOSIS — R05 Cough: Secondary | ICD-10-CM

## 2019-07-07 DIAGNOSIS — R509 Fever, unspecified: Secondary | ICD-10-CM | POA: Diagnosis not present

## 2019-07-07 DIAGNOSIS — Z03818 Encounter for observation for suspected exposure to other biological agents ruled out: Secondary | ICD-10-CM

## 2019-07-07 DIAGNOSIS — M791 Myalgia, unspecified site: Secondary | ICD-10-CM | POA: Diagnosis not present

## 2019-07-07 DIAGNOSIS — R6889 Other general symptoms and signs: Secondary | ICD-10-CM

## 2019-07-07 DIAGNOSIS — Z20822 Contact with and (suspected) exposure to covid-19: Secondary | ICD-10-CM

## 2019-07-07 LAB — POCT INFLUENZA A/B
Influenza A, POC: NEGATIVE
Influenza B, POC: NEGATIVE

## 2019-07-07 LAB — POC SARS CORONAVIRUS 2 AG -  ED: SARS Coronavirus 2 Ag: NEGATIVE

## 2019-07-07 MED ORDER — BENZONATATE 100 MG PO CAPS: 100.0000 mg | ORAL_CAPSULE | Freq: Three times a day (TID) | ORAL | 0 refills | Status: DC

## 2019-07-07 NOTE — ED Provider Notes (Signed)
[facility]   [id number] 07/07/19 Arrival Time: 1239   CC: COVID symptoms;COVID exposure  SUBJECTIVE: History from: patient.  Maria Wiley is a 43 y.o. female who presents with dry cough, fever, with tmax of 101, and body aches x 2 days.  Husband tested positive for COVID.  Denies recent travel.  Has tried OTC medications with relief.  Symptoms are made worse with at night.   Denies sinus pain, rhinorrhea, sore throat, SOB, wheezing, chest pain, nausea, vomiting, changes in bowel or bladder habits.    ROS: As per HPI.  All other pertinent ROS negative.     Past Medical History:  Diagnosis Date   Ankylosing spondylitis (HCC)    Chest pain    GERD (gastroesophageal reflux disease)    Hypertension    Migraines    RA (rheumatoid arthritis) ([place])    Thyroid disease    Urinary incontinence    Past Surgical History:  Procedure Laterality Date   ABDOMINAL HYSTERECTOMY     APPENDECTOMY     CHOLECYSTECTOMY     HERNIA REPAIR  2004   KNEE SURGERY Right 2006   No Known Allergies No current facility-administered medications on file prior to encounter.    Current Outpatient Medications on File Prior to Encounter  Medication Sig Dispense Refill   amLODipine (NORVASC) 5 MG tablet TAKE 1 TABLET (5 MG TOTAL) BY MOUTH DAILY. 90 tablet 3   cholecalciferol (VITAMIN D) 1000 units tablet Take 1,000 Units by mouth daily.     Erenumab-aooe (AIMOVIG) 140 MG/ML SOAJ Inject 140 mg into the skin every 30 (thirty) days. 1 pen 11   escitalopram (LEXAPRO) 20 MG tablet Take 1 tablet (20 mg total) by mouth daily. 90 tablet 3   etanercept (ENBREL SURECLICK) 50 MG/ML injection Inject 0.98 mLs (50 mg total) into the skin once a week. 12 pen 0   folic acid (FOLVITE) 1 MG tablet Take 1 tablet (1 mg total) by mouth daily. 90 tablet 3   leflunomide (ARAVA) 20 MG tablet Take 20 mg by mouth daily.     levothyroxine (SYNTHROID) 25 MCG tablet TAKE 1 TABLET BY MOUTH DAILY  BEFORE BREAKFAST 90 tablet 3   LINZESS 145 MCG CAPS capsule Take 145 mcg by mouth daily as needed.      Magnesium 250 MG TABS Take 250 mg by mouth daily.     ondansetron (ZOFRAN) 4 MG tablet Take 1 tablet (4 mg total) by mouth every 8 (eight) hours as needed for nausea or vomiting. 6 tablet 0   ondansetron (ZOFRAN-ODT) 4 MG disintegrating tablet Take 1 tablet (4 mg total) by mouth every 8 (eight) hours as needed for nausea. 30 tablet 3   rizatriptan (MAXALT-MLT) 10 MG disintegrating tablet Take 1 tablet (10 mg total) by mouth as needed for migraine. May repeat in 2 hours if needed 9 tablet 11   tretinoin (RETIN-A) 0.025 % cream APPLY AA QHS     valACYclovir (VALTREX) 1000 MG tablet Take 1 tablet (1,000 mg total) by mouth 2 (two) times daily. (Patient not taking: Reported on 05/10/2019) 60 tablet 3   VESICARE 10 MG tablet Take 1 tablet (10 mg total) by mouth daily. 90 tablet 3   Social History   Socioeconomic History   Marital status: Married    Spouse name: Not on file   Number of children: 4   Years of education: Not on file   Highest education level: Bachelor's degree (e.g., BA, AB, BS)  Occupational History  Not on file  Social Needs   Financial resource strain: Not on file   Food insecurity    Worry: Not on file    Inability: Not on file   Transportation needs    Medical: Not on file    Non-medical: Not on file  Tobacco Use   Smoking status: Never Smoker   Smokeless tobacco: Never Used  Substance and Sexual Activity   Alcohol use: Never    Frequency: Never   Drug use: Never   Sexual activity: Yes    Birth control/protection: Surgical  Lifestyle   Physical activity    Days per week: Not on file    Minutes per session: Not on file   Stress: Not on file  Relationships   Social connections    Talks on phone: Not on file    Gets together: Not on file    Attends religious service: Not on file    Active member of club or organization: Not on file     Attends meetings of clubs or organizations: Not on file    Relationship status: Not on file   Intimate partner violence    Fear of current or ex partner: Not on file    Emotionally abused: Not on file    Physically abused: Not on file    Forced sexual activity: Not on file  Other Topics Concern   Not on file  Social History Narrative   Lives at home with her husband & her 4 sons   Right handed   Caffeine: 2-3 cups per day   Family History  Problem Relation Age of Onset   CAD Maternal Grandfather    Diabetes Maternal Grandfather    Hearing loss Maternal Grandfather    Heart disease Maternal Grandfather    Hyperlipidemia Maternal Grandfather    Hypertension Mother    Arthritis Mother    Arthritis Maternal Grandmother    Heart disease Maternal Grandmother    Hyperlipidemia Maternal Grandmother    Migraines Neg Hx    Headache Neg Hx     OBJECTIVE:  Vitals:   07/07/19 1301  BP: (!) 141/85  Pulse: (!) 107  Resp: 20  Temp: 99.9 F (37.7 C)  SpO2: 96%     General appearance: alert; appears fatigued, but nontoxic; speaking in full sentences and tolerating own secretions HEENT: NCAT; Ears: EACs clear, TMs pearly gray; Eyes: PERRL.  EOM grossly intact.  Nose: nares patent without rhinorrhea, Throat: oropharynx clear, tonsils non erythematous or enlarged, uvula midline  Neck: supple without LAD Lungs: unlabored respirations, symmetrical air entry; cough: mild; no respiratory distress; CTAB Heart: regular rate and rhythm.   Skin: warm and dry Psychological: alert and cooperative; normal mood and affect  LABS:  Results for orders placed or performed during the hospital encounter of 07/07/19 (from the past 24 hour(s))  POC SARS Coronavirus 2 Ag-ED - Nasal Swab (BD Veritor Kit)     Status: None   Collection Time: 07/07/19  1:11 PM  Result Value Ref Range   SARS Coronavirus 2 Ag Negative Negative  POCT Influenza A/B     Status: None   Collection Time: 07/07/19   1:41 PM  Result Value Ref Range   Influenza A, POC Negative Negative   Influenza B, POC Negative Negative    ASSESSMENT & PLAN:  1. Suspected COVID-19 virus infection   2. Exposure to COVID-19 virus   3. Lab test negative for COVID-19 virus   4. Flu-like symptoms  Meds ordered this encounter  Medications   benzonatate (TESSALON) 100 MG capsule    Sig: Take 1 capsule (100 mg total) by mouth every 8 (eight) hours.    Dispense:  21 capsule    Refill:  0    Order Specific Question:   Supervising Provider    Answer:   Raylene Everts [[id number]]   Rapid COVID negative.  Flu negative Culture sent.  Patient should remain in quarantine until they have received culture results.  If negative you may resume normal activities (go back to work/school) while practicing hand hygiene, social distance, and mask wearing.  If positive, patient should remain in quarantine for 10 days from symptom onset AND greater than 72 hours after symptoms resolution (absence of fever without the use of fever-reducing medication and improvement in respiratory symptoms), whichever is longer Get plenty of rest and push fluids Tessalon perles for cough Use OTC zyrtec for nasal congestion, runny nose, and/or sore throat Use OTC flonase for nasal congestion and runny nose Use medications daily for symptom relief Use OTC medications like ibuprofen or tylenol as needed fever or pain Call or go to the ED if you have any new or worsening symptoms such as fever, worsening cough, shortness of breath, chest tightness, chest pain, turning blue, changes in mental status, etc...   Reviewed expectations re: course of current medical issues. Questions answered. Outlined signs and symptoms indicating need for more acute intervention. Patient verbalized understanding. After Visit Summary given.         Lestine Box, PA-C 07/07/19 1424

## 2019-07-07 NOTE — ED Triage Notes (Signed)
Pt presents with cough fever and body aches that began 2 days ago, husband is positive for covid

## 2019-07-07 NOTE — Discharge Instructions (Signed)
Rapid COVID negative.  Culture sent.  Patient should remain in quarantine until they have received culture results.  If negative you may resume normal activities (go back to work/school) while practicing hand hygiene, social distance, and mask wearing.  If positive, patient should remain in quarantine for 10 days from symptom onset AND greater than 72 hours after symptoms resolution (absence of fever without the use of fever-reducing medication and improvement in respiratory symptoms), whichever is longer Get plenty of rest and push fluids Tessalon perles for cough Use OTC zyrtec for nasal congestion, runny nose, and/or sore throat Use OTC flonase for nasal congestion and runny nose Use medications daily for symptom relief Use OTC medications like ibuprofen or tylenol as needed fever or pain Call or go to the ED if you have any new or worsening symptoms such as fever, worsening cough, shortness of breath, chest tightness, chest pain, turning blue, changes in mental status, etc..Marland Kitchen

## 2019-07-09 LAB — NOVEL CORONAVIRUS, NAA: SARS-CoV-2, NAA: DETECTED — AB

## 2019-07-10 ENCOUNTER — Telehealth (HOSPITAL_COMMUNITY): Payer: Self-pay | Admitting: Emergency Medicine

## 2019-07-10 NOTE — Telephone Encounter (Signed)

## 2019-07-18 MED FILL — ENBREL SURECLICK 50 MG/ML S: 50 | 28 days supply | Qty: 4 | Fill #2

## 2019-07-18 MED FILL — LEFLUNOMIDE 20 MG TABLET: 20 | 90 days supply | Qty: 90 | Fill #0

## 2019-07-18 MED FILL — AIMOVIG 140 MG/ML SOAJ: 140 | 30 days supply | Qty: 1 | Fill #2

## 2019-08-22 MED FILL — AMLODIPINE BESYLATE 5 MG TA: 5 | 90 days supply | Qty: 90 | Fill #1

## 2019-08-22 MED FILL — LEVOTHYROXINE SODIUM 25 MCG: 25 | 90 days supply | Qty: 90 | Fill #1

## 2019-08-22 MED FILL — SOLIFENACIN SUCCINATE 10 MG: 10 | 90 days supply | Qty: 90 | Fill #1

## 2019-09-07 MED FILL — ESCITALOPRAM 20 MG TABLET: 20 | 90 days supply | Qty: 45 | Fill #1

## 2019-09-22 ENCOUNTER — Other Ambulatory Visit: Payer: Self-pay

## 2019-09-22 ENCOUNTER — Ambulatory Visit (HOSPITAL_BASED_OUTPATIENT_CLINIC_OR_DEPARTMENT_OTHER): Payer: No Typology Code available for payment source | Admitting: Pharmacist

## 2019-09-22 DIAGNOSIS — Z79899 Other long term (current) drug therapy: Secondary | ICD-10-CM

## 2019-09-22 MED FILL — ENBREL SURECLICK 50 MG/ML S: 50 | 28 days supply | Qty: 4 | Fill #0

## 2019-09-22 NOTE — Progress Notes (Signed)
   S: Patient presents for review of their specialty medication therapy.  Patient is currently taking Enbrel for rheumatoid arthritis. Patient is managed by Azucena Fallen for this.   Adherence: denies any missed doses  Efficacy: working well for her, has been on for a while.  Dosing: 50 mg daily  Ankylosing spondylitis, psoriatic arthritis, rheumatoid arthritis: SubQ: Note: May continue methotrexate, glucocorticoids, salicylates, NSAIDs, or analgesics during etanercept therapy. Once-weekly dosing: 50 mg once weekly; maximum dose (rheumatoid arthritis): 50 mg/week. Twice-weekly dosing (off-label dose): 25 mg twice weekly (Bathon 2000; Calin 2004; Earlene Plater 2003; [place]esto Charon 2002; Mease 2000; Mease 2002)   Screening: TB test: completed per patient Hepatitis B: completed per patient  Monitoring: Injection site reactions: denies  S/sx of infections: denies S/sx of malignancy: denies GI upset: denies  O:    Lab Results  Component Value Date   WBC 5.7 05/08/2019   HGB 10.7 (L) 05/08/2019   HCT 33.3 (L) 05/08/2019   MCV 79.3 05/08/2019   PLT 225.0 05/08/2019      Chemistry      Component Value Date/Time   NA 137 05/08/2019 1046   K 3.9 05/08/2019 1046   CL 105 05/08/2019 1046   CO2 27 05/08/2019 1046   BUN 14 05/08/2019 1046   CREATININE 0.62 05/08/2019 1046      Component Value Date/Time   CALCIUM 9.1 05/08/2019 1046   ALKPHOS 67 05/08/2019 1046   AST 11 05/08/2019 1046   ALT 6 05/08/2019 1046   BILITOT 0.3 05/08/2019 1046       A/P: 1. Medication review: patient currently on Enbrel for rheumatoid arthritis and is tolerating it well. Reviewed the medication with the patient, including the following: Enbrel (etanercept) binds tumor necrosis factor (TNF) and blocks its interaction with cell surface receptors. TNF plays an important role in the inflammatory processes of many diseases. Patient educated on purpose, proper use and potential adverse effects of Enbrel. Adverse  effects include rash, GI upset, increased risk of infection, and injection site reactions. Patients should stop Enbrel if they develop a serious infection. There is a possible increased risk in lymphoma and other malignancies. No recommendations for any changes.   Butch Penny, PharmD, CPP Clinical Pharmacist [facility] [phone]

## 2019-10-12 ENCOUNTER — Ambulatory Visit
Admission: EM | Admit: 2019-10-12 | Discharge: 2019-10-12 | Disposition: A | Discharge status: Home / Self Care | Payer: No Typology Code available for payment source | Attending: Emergency Medicine | Admitting: Emergency Medicine

## 2019-10-12 ENCOUNTER — Encounter: Payer: Self-pay | Admitting: Emergency Medicine

## 2019-10-12 DIAGNOSIS — H1033 Unspecified acute conjunctivitis, bilateral: Secondary | ICD-10-CM

## 2019-10-12 MED ORDER — TOBRAMYCIN 0.3 % OP SOLN: 1.0000 [drp] | OPHTHALMIC | 0 refills | Status: DC

## 2019-10-12 MED FILL — TOBRAMYCIN 0.3 % SOLN: 0.3 | 16 days supply | Qty: 5 | Fill #0

## 2019-10-12 NOTE — Discharge Instructions (Signed)
Use the antibiotic eyedrops as prescribed.    Follow-up with your eye doctor for a recheck in 1 week if your symptoms persist or worsen.    Go to the emergency department if you have acute eye pain or changes in your vision.

## 2019-10-12 NOTE — ED Triage Notes (Signed)
Patient in office today c/o right eye issue since yesterday red,crusty and runny   [id number]

## 2019-10-12 NOTE — ED Provider Notes (Signed)
Maria Wiley    CSN: [id number] Arrival date & time: 10/12/19  2993      History   Chief Complaint Chief Complaint  Patient presents with  . Conjunctivitis    HPI Maria Wiley is a 44 y.o. female.   Patient presents with crusting, drainage, and itching in both eyes x2 days, R>L.  Patient reports slight sensation of scratch in right eye.  No eye pain or vision changes.  No known sick contacts.  She denies fever, chills, congestion, rhinorrhea, cough, shortness of breath, or other symptoms.  No treatments attempted at home.    The history is provided by the patient.    Past Medical History:  Diagnosis Date  . Ankylosing spondylitis ([place])   . Chest pain   . GERD (gastroesophageal reflux disease)   . Hypertension   . Migraines   . RA (rheumatoid arthritis) ([place])   . Thyroid disease   . Urinary incontinence     Patient Active Problem List   Diagnosis Date Noted  . Chronic migraine without aura without status migrainosus, not intractable 05/10/2019  . Sleep deprivation 05/10/2019  . Vertigo 05/08/2019  . Allergic rhinitis 05/08/2019  . Acute bilateral low back pain without sciatica 05/08/2019  . Essential hypertension 03/10/2019  . Attention and concentration deficit 01/24/2019  . Abdominal pain 11/16/2018  . Weakness 10/11/2018  . New daily persistent headache 10/11/2018  . Hair loss 02/25/2018  . Ankylosing spondylitis ([place]) 01/15/2018  . OA (osteoarthritis) of knee 01/15/2018  . Routine general medical examination at a health care facility 01/15/2018  . IBS (irritable bowel syndrome) 01/15/2018  . RA (rheumatoid arthritis) ([place])   . Hypothyroidism     Past Surgical History:  Procedure Laterality Date  . ABDOMINAL HYSTERECTOMY    . APPENDECTOMY    . CHOLECYSTECTOMY    . HERNIA REPAIR  2004  . KNEE SURGERY Right 2006    OB History   No obstetric history on file.      Home Medications    Prior to Admission medications   Medication  Sig Start Date End Date Taking? Authorizing Provider  amLODipine (NORVASC) 5 MG tablet TAKE 1 TABLET (5 MG TOTAL) BY MOUTH DAILY. 05/09/19   Hoyt Koch, MD  benzonatate (TESSALON) 100 MG capsule Take 1 capsule (100 mg total) by mouth every 8 (eight) hours. 07/07/19   Wurst, [place], PA-C  cholecalciferol (VITAMIN D) 1000 units tablet Take 1,000 Units by mouth daily.    [provider]  Erenumab-aooe (AIMOVIG) 140 MG/ML SOAJ Inject 140 mg into the skin every 30 (thirty) days. 11/09/18   Melvenia Beam, MD  escitalopram (LEXAPRO) 20 MG tablet Take 1 tablet (20 mg total) by mouth daily. 06/22/19   Hoyt Koch, MD  etanercept (ENBREL SURECLICK) 50 MG/ML injection Inject 0.98 mLs (50 mg total) into the skin once a week. 04/18/19   Tresa Garter, MD  folic acid (FOLVITE) 1 MG tablet Take 1 tablet (1 mg total) by mouth daily. 01/14/18   Hoyt Koch, MD  leflunomide (ARAVA) 20 MG tablet Take 20 mg by mouth daily.    [provider]  levothyroxine (SYNTHROID) 25 MCG tablet TAKE 1 TABLET BY MOUTH DAILY BEFORE BREAKFAST 05/09/19   Hoyt Koch, MD  LINZESS 145 MCG CAPS capsule Take 145 mcg by mouth daily as needed.  07/25/17   [provider]  Magnesium 250 MG TABS Take 250 mg by mouth daily.    [provider]  ondansetron (ZOFRAN) 4 MG tablet Take 1 tablet (4 mg total) by mouth every 8 (eight) hours as needed for nausea or vomiting. 11/12/18   Couture, Cortni S, PA-C  ondansetron (ZOFRAN-ODT) 4 MG disintegrating tablet Take 1 tablet (4 mg total) by mouth every 8 (eight) hours as needed for nausea. 05/10/19   Anson Fret, MD  rizatriptan (MAXALT-MLT) 10 MG disintegrating tablet Take 1 tablet (10 mg total) by mouth as needed for migraine. May repeat in 2 hours if needed 05/10/19   Anson Fret, MD  tobramycin (TOBREX) 0.3 % ophthalmic solution Place 1 drop into both eyes every 4 (four) hours. 10/12/19   Mickie Bail, NP    tretinoin (RETIN-A) 0.025 % cream APPLY AA QHS 01/28/19   [provider]  valACYclovir (VALTREX) 1000 MG tablet Take 1 tablet (1,000 mg total) by mouth 2 (two) times daily. Patient not taking: Reported on 05/10/2019 03/23/19   Myrlene Broker, MD  VESICARE 10 MG tablet Take 1 tablet (10 mg total) by mouth daily. 05/09/19   Myrlene Broker, MD    Family History Family History  Problem Relation Age of Onset  . CAD Maternal Grandfather   . Diabetes Maternal Grandfather   . Hearing loss Maternal Grandfather   . Heart disease Maternal Grandfather   . Hyperlipidemia Maternal Grandfather   . Hypertension Mother   . Arthritis Mother   . Arthritis Maternal Grandmother   . Heart disease Maternal Grandmother   . Hyperlipidemia Maternal Grandmother   . Migraines Neg Hx   . Headache Neg Hx     Social History Social History   Tobacco Use  . Smoking status: Never Smoker  . Smokeless tobacco: Never Used  Substance Use Topics  . Alcohol use: Never  . Drug use: Never     Allergies   Patient has no known allergies.   Review of Systems Review of Systems  Constitutional: Negative for chills and fever.  HENT: Negative for ear pain and sore throat.   Eyes: Positive for redness and itching. Negative for pain and visual disturbance.  Respiratory: Negative for cough and shortness of breath.   Cardiovascular: Negative for chest pain and palpitations.  Gastrointestinal: Negative for abdominal pain and vomiting.  Genitourinary: Negative for dysuria and hematuria.  Musculoskeletal: Negative for arthralgias and back pain.  Skin: Negative for color change and rash.  Neurological: Negative for seizures and syncope.  All other systems reviewed and are negative.    Physical Exam Triage Vital Signs ED Triage Vitals  Enc Vitals Group     BP      Pulse      Resp      Temp      Temp src      SpO2      Weight      Height      Head Circumference      Peak Flow       Pain Score      Pain Loc      Pain Edu?      Excl. in GC?    No data found.  Updated Vital Signs BP 132/79 (BP Location: Right Arm)   Pulse 74   Temp 98.2 F (36.8 C) (Oral)   Resp 18   SpO2 96%   Visual Acuity Right Eye Distance:   Left Eye Distance:   Bilateral Distance:    Right Eye Near:   Left Eye Near:    Bilateral Near:  Physical Exam Vitals and nursing note reviewed.  Constitutional:      General: She is not in acute distress.    Appearance: She is well-developed.  HENT:     Head: Normocephalic and atraumatic.     Right Ear: Tympanic membrane normal.     Left Ear: Tympanic membrane normal.     Nose: Nose normal.     Mouth/Throat:     Mouth: Mucous membranes are moist.     Pharynx: Oropharynx is clear.  Eyes:     General: Lids are normal. Lids are everted, no foreign bodies appreciated. Vision grossly intact.        Right eye: No discharge.        Left eye: No discharge.     Extraocular Movements: Extraocular movements intact.     Conjunctiva/sclera:     Right eye: Right conjunctiva is injected.     Left eye: Left conjunctiva is injected.     Pupils: Pupils are equal, round, and reactive to light.     Comments: Right thigh examined with fluorescein; no abrasion noted.  Cardiovascular:     Rate and Rhythm: Normal rate and regular rhythm.     Heart sounds: No murmur.  Pulmonary:     Effort: Pulmonary effort is normal. No respiratory distress.     Breath sounds: Normal breath sounds.  Abdominal:     General: Bowel sounds are normal.     Palpations: Abdomen is soft.     Tenderness: There is no abdominal tenderness. There is no guarding or rebound.  Musculoskeletal:     Cervical back: Neck supple.  Skin:    General: Skin is warm and dry.     Findings: No rash.  Neurological:     General: No focal deficit present.     Mental Status: She is alert and oriented to person, place, and time.  Psychiatric:        Mood and Affect: Mood normal.         Behavior: Behavior normal.      UC Treatments / Results  Labs (all labs ordered are listed, but only abnormal results are displayed) Labs Reviewed - No data to display  EKG   Radiology No results found.  Procedures Procedures (including critical care time)  Medications Ordered in UC Medications - No data to display  Initial Impression / Assessment and Plan / UC Course  I have reviewed the triage vital signs and the nursing notes.  Pertinent labs & imaging results that were available during my care of the patient were reviewed by me and considered in my medical decision making (see chart for details).    Acute conjunctivitis.  Treating with tobramycin eyedrops.  Discussed with patient that conjunctivitis is highly transmittable to others.  Discussed keeping hands washed and surfaces cleaned.  Instructed her to follow-up with her PCP or eye doctor for a recheck in 1 week if her symptoms are not improving.  Instructed her to go to the emergency department if she has acute eye pain or changes in her vision.  Patient agrees to plan of care.     Final Clinical Impressions(s) / UC Diagnoses   Final diagnoses:  Acute bacterial conjunctivitis of both eyes     Discharge Instructions     Use the antibiotic eyedrops as prescribed.    Follow-up with your eye doctor for a recheck in 1 week if your symptoms persist or worsen.    Go to the emergency department if you  have acute eye pain or changes in your vision.         ED Prescriptions    Medication Sig Dispense Auth. Provider   tobramycin (TOBREX) 0.3 % ophthalmic solution Place 1 drop into both eyes every 4 (four) hours. 5 mL Mickie Bail, NP     PDMP not reviewed this encounter.   Mickie Bail, NP 10/12/19 1031

## 2019-10-12 NOTE — ED Notes (Signed)
Patient in office today c/o Right eye pink,cluster and runny since yesterday  [id number]

## 2019-10-25 MED FILL — ENBREL SURECLICK 50 MG/ML S: 50 | 28 days supply | Qty: 4 | Fill #0

## 2019-10-31 MED FILL — LEFLUNOMIDE 20 MG TABLET: 20 | 90 days supply | Qty: 90 | Fill #0

## 2019-11-08 MED FILL — ENBREL SURECLICK 50 MG/ML S: 50 | 28 days supply | Qty: 4 | Fill #1

## 2019-11-22 MED FILL — DICLOFENAC SOD EC 75 MG TAB: 75 | 30 days supply | Qty: 60 | Fill #0

## 2019-12-05 ENCOUNTER — Other Ambulatory Visit: Payer: Self-pay | Admitting: Pharmacist

## 2019-12-05 ENCOUNTER — Ambulatory Visit
Admission: EM | Admit: 2019-12-05 | Discharge: 2019-12-05 | Disposition: A | Discharge status: Home / Self Care | Payer: No Typology Code available for payment source | Attending: Emergency Medicine | Admitting: Emergency Medicine

## 2019-12-05 ENCOUNTER — Other Ambulatory Visit: Payer: Self-pay

## 2019-12-05 DIAGNOSIS — J0100 Acute maxillary sinusitis, unspecified: Secondary | ICD-10-CM | POA: Diagnosis not present

## 2019-12-05 HISTORY — DX: COVID-19: U07.1

## 2019-12-05 MED ORDER — AMOXICILLIN 875 MG PO TABS: 875.0000 mg | ORAL_TABLET | Freq: Two times a day (BID) | ORAL | 0 refills | Status: AC

## 2019-12-05 MED ORDER — ENBREL SURECLICK 50 MG/ML ~~LOC~~ SOAJ: SUBCUTANEOUS | 0 refills | Status: DC

## 2019-12-05 MED ORDER — FLUCONAZOLE 150 MG PO TABS: 150.0000 mg | ORAL_TABLET | Freq: Every day | ORAL | 0 refills | Status: DC

## 2019-12-05 MED FILL — ESCITALOPRAM 20 MG TABLET: 20 | 90 days supply | Qty: 45 | Fill #2

## 2019-12-05 MED FILL — AMLODIPINE BESYLATE 5 MG TA: 5 | 90 days supply | Qty: 90 | Fill #2

## 2019-12-05 MED FILL — FLUCONAZOLE 150 MG TABLET: 150 | 2 days supply | Qty: 2 | Fill #0

## 2019-12-05 MED FILL — LEVOTHYROXINE SODIUM 25 MCG: 25 | 90 days supply | Qty: 90 | Fill #2

## 2019-12-05 MED FILL — AMOXICILLIN 875 MG TABS: 875 | 7 days supply | Qty: 14 | Fill #0

## 2019-12-05 MED FILL — SOLIFENACIN SUCCINATE 10 MG: 10 | 90 days supply | Qty: 90 | Fill #2

## 2019-12-05 NOTE — ED Provider Notes (Signed)
[name]    CSN: [id number] Arrival date & time: 12/05/19  1437      History   Chief Complaint Chief Complaint  Patient presents with  . Sinus Problem    HPI Maria Wiley is a 44 y.o. female.   Patient presents with sinus congestion, postnasal drip, rhinorrhea x2 weeks.  She states her symptoms are worse for the past 6 days.  She has been treating her symptoms at home with Claritin and Afrin.  She denies fever, chills, sore throat, cough, shortness of breath, vomiting, diarrhea, rash, or other symptoms.    The history is provided by the patient.    Past Medical History:  Diagnosis Date  . Ankylosing spondylitis ([facility])   . Chest pain   . COVID-19 06/2020  . GERD (gastroesophageal reflux disease)   . Hypertension   . Migraines   . RA (rheumatoid arthritis) ([facility])   . Thyroid disease   . Urinary incontinence     Patient Active Problem List   Diagnosis Date Noted  . Chronic migraine without aura without status migrainosus, not intractable 05/10/2019  . Sleep deprivation 05/10/2019  . Vertigo 05/08/2019  . Allergic rhinitis 05/08/2019  . Acute bilateral low back pain without sciatica 05/08/2019  . Essential hypertension 03/10/2019  . Attention and concentration deficit 01/24/2019  . Abdominal pain 11/16/2018  . Weakness 10/11/2018  . New daily persistent headache 10/11/2018  . Hair loss 02/25/2018  . Ankylosing spondylitis ([facility]) 01/15/2018  . OA (osteoarthritis) of knee 01/15/2018  . Routine general medical examination at a health care facility 01/15/2018  . IBS (irritable bowel syndrome) 01/15/2018  . RA (rheumatoid arthritis) ([facility])   . Hypothyroidism     Past Surgical History:  Procedure Laterality Date  . ABDOMINAL HYSTERECTOMY    . APPENDECTOMY    . CHOLECYSTECTOMY    . HERNIA REPAIR  2004  . KNEE SURGERY Right 2006    OB History   No obstetric history on file.      Home Medications    Prior to Admission medications    Medication Sig Start Date End Date Taking? Authorizing Provider  amLODipine (NORVASC) 5 MG tablet TAKE 1 TABLET (5 MG TOTAL) BY MOUTH DAILY. 05/09/19  Yes Myrlene Broker, MD  cholecalciferol (VITAMIN D) 1000 units tablet Take 1,000 Units by mouth daily.   Yes [provider]  Erenumab-aooe (AIMOVIG) 140 MG/ML SOAJ Inject 140 mg into the skin every 30 (thirty) days. 11/09/18  Yes Anson Fret, MD  escitalopram (LEXAPRO) 20 MG tablet Take 1 tablet (20 mg total) by mouth daily. 06/22/19  Yes Myrlene Broker, MD  etanercept (ENBREL SURECLICK) 50 MG/ML injection Inject 0.98 mLs (50 mg total) into the skin once a week. 04/18/19  Yes Quentin Angst, MD  folic acid (FOLVITE) 1 MG tablet Take 1 tablet (1 mg total) by mouth daily. 01/14/18  Yes Myrlene Broker, MD  leflunomide (ARAVA) 20 MG tablet Take 20 mg by mouth daily.   Yes [provider]  levothyroxine (SYNTHROID) 25 MCG tablet TAKE 1 TABLET BY MOUTH DAILY BEFORE BREAKFAST 05/09/19  Yes Myrlene Broker, MD  LINZESS 145 MCG CAPS capsule Take 145 mcg by mouth daily as needed.  07/25/17  Yes [provider]  Magnesium 250 MG TABS Take 250 mg by mouth daily.   Yes [provider]  rizatriptan (MAXALT-MLT) 10 MG disintegrating tablet Take 1 tablet (10 mg total) by mouth as needed for migraine. May repeat  in 2 hours if needed 05/10/19  Yes Anson Fret, MD  valACYclovir (VALTREX) 1000 MG tablet Take 1 tablet (1,000 mg total) by mouth 2 (two) times daily. 03/23/19  Yes Myrlene Broker, MD  VESICARE 10 MG tablet Take 1 tablet (10 mg total) by mouth daily. 05/09/19  Yes Myrlene Broker, MD  amoxicillin (AMOXIL) 875 MG tablet Take 1 tablet (875 mg total) by mouth 2 (two) times daily for 7 days. 12/05/19 12/12/19  Mickie Bail, NP  benzonatate (TESSALON) 100 MG capsule Take 1 capsule (100 mg total) by mouth every 8 (eight) hours. 07/07/19   Wurst, [place], PA-C  fluconazole (DIFLUCAN)  150 MG tablet Take 1 tablet (150 mg total) by mouth daily. Take one tablet today.  May repeat in 3 days. 12/05/19   Mickie Bail, NP  ondansetron (ZOFRAN) 4 MG tablet Take 1 tablet (4 mg total) by mouth every 8 (eight) hours as needed for nausea or vomiting. 11/12/18   Couture, Cortni S, PA-C  ondansetron (ZOFRAN-ODT) 4 MG disintegrating tablet Take 1 tablet (4 mg total) by mouth every 8 (eight) hours as needed for nausea. 05/10/19   Anson Fret, MD  tobramycin (TOBREX) 0.3 % ophthalmic solution Place 1 drop into both eyes every 4 (four) hours. 10/12/19   Mickie Bail, NP  tretinoin (RETIN-A) 0.025 % cream APPLY AA QHS 01/28/19   [provider]    Family History Family History  Problem Relation Age of Onset  . CAD Maternal Grandfather   . Diabetes Maternal Grandfather   . Hearing loss Maternal Grandfather   . Heart disease Maternal Grandfather   . Hyperlipidemia Maternal Grandfather   . Hypertension Mother   . Arthritis Mother   . Arthritis Maternal Grandmother   . Heart disease Maternal Grandmother   . Hyperlipidemia Maternal Grandmother   . Migraines Neg Hx   . Headache Neg Hx     Social History Social History   Tobacco Use  . Smoking status: Never Smoker  . Smokeless tobacco: Never Used  Substance Use Topics  . Alcohol use: Never  . Drug use: Never     Allergies   Patient has no known allergies.   Review of Systems Review of Systems  Constitutional: Negative for chills and fever.  HENT: Positive for congestion, postnasal drip, rhinorrhea and sinus pressure. Negative for ear pain and sore throat.   Eyes: Negative for pain and visual disturbance.  Respiratory: Negative for cough and shortness of breath.   Cardiovascular: Negative for chest pain and palpitations.  Gastrointestinal: Negative for abdominal pain, diarrhea, nausea and vomiting.  Genitourinary: Negative for dysuria and hematuria.  Musculoskeletal: Negative for arthralgias and back pain.  Skin:  Negative for color change and rash.  Neurological: Negative for seizures and syncope.  All other systems reviewed and are negative.    Physical Exam Triage Vital Signs ED Triage Vitals [12/05/19 1444]  Enc Vitals Group     BP      Pulse      Resp      Temp      Temp src      SpO2      Weight 228 lb (103.4 kg)     Height 5\' 7"  (1.702 m)     Head Circumference      Peak Flow      Pain Score 0     Pain Loc      Pain Edu?      Excl. in  GC?    No data found.  Updated Vital Signs BP 124/73 (BP Location: Right Arm)   Pulse 84   Temp 99.6 F (37.6 C) (Oral)   Ht 5\' 7"  (1.702 m)   Wt 228 lb (103.4 kg)   SpO2 97%   BMI 35.71 kg/m   Visual Acuity Right Eye Distance:   Left Eye Distance:   Bilateral Distance:    Right Eye Near:   Left Eye Near:    Bilateral Near:     Physical Exam Vitals and nursing note reviewed.  Constitutional:      General: She is not in acute distress.    Appearance: She is well-developed.  HENT:     Head: Normocephalic and atraumatic.     Right Ear: Tympanic membrane normal.     Left Ear: Tympanic membrane normal.     Nose: Congestion and rhinorrhea present.     Comments: Right maxillary sinus tenderness.    Mouth/Throat:     Mouth: Mucous membranes are moist.     Pharynx: Oropharynx is clear.  Eyes:     Conjunctiva/sclera: Conjunctivae normal.  Cardiovascular:     Rate and Rhythm: Normal rate and regular rhythm.     Heart sounds: No murmur.  Pulmonary:     Effort: Pulmonary effort is normal. No respiratory distress.     Breath sounds: Normal breath sounds.  Abdominal:     General: Bowel sounds are normal.     Palpations: Abdomen is soft.     Tenderness: There is no abdominal tenderness. There is no guarding or rebound.  Musculoskeletal:     Cervical back: Neck supple.  Skin:    General: Skin is warm and dry.     Findings: No rash.  Neurological:     General: No focal deficit present.     Mental Status: She is alert and  oriented to person, place, and time.  Psychiatric:        Mood and Affect: Mood normal.        Behavior: Behavior normal.      UC Treatments / Results  Labs (all labs ordered are listed, but only abnormal results are displayed) Labs Reviewed - No data to display  EKG   Radiology No results found.  Procedures Procedures (including critical care time)  Medications Ordered in UC Medications - No data to display  Initial Impression / Assessment and Plan / UC Course  I have reviewed the triage vital signs and the nursing notes.  Pertinent labs & imaging results that were available during my care of the patient were reviewed by me and considered in my medical decision making (see chart for details).   Acute maxillary sinusitis.  Treating with amoxicillin and Mucinex.  Additionally prescription provided for Diflucan as patient reports yeast infections anytime she takes an antibiotic.  Instructed patient to follow-up with her PCP if her symptoms or not improving.  Patient agrees to plan of care.       Final Clinical Impressions(s) / UC Diagnoses   Final diagnoses:  Acute non-recurrent maxillary sinusitis     Discharge Instructions     Take the amoxicillin and Diflucan as directed.    Follow up with your primary care provider if your symptoms are not improving.        ED Prescriptions    Medication Sig Dispense Auth. Provider   amoxicillin (AMOXIL) 875 MG tablet Take 1 tablet (875 mg total) by mouth 2 (two) times daily for 7 days.  14 tablet Sharion Balloon, NP   fluconazole (DIFLUCAN) 150 MG tablet Take 1 tablet (150 mg total) by mouth daily. Take one tablet today.  May repeat in 3 days. 2 tablet Sharion Balloon, NP     PDMP not reviewed this encounter.   Sharion Balloon, NP 12/05/19 1459

## 2019-12-05 NOTE — ED Triage Notes (Signed)
Pt presents with c/o increased sinus congestion, post-nasal drip and a bad taste in her mouth for several weeks. Pt reports increased symptoms since last Thursday. Pt has been using Afrin and Claritin with some symptom improvement. Pt denies fever/chills, n/v/d, cough, sore throat, ear pain or other symptoms.

## 2019-12-05 NOTE — Discharge Instructions (Addendum)
Take the amoxicillin and Diflucan as directed.  Follow up with your primary care provider if your symptoms are not improving.    

## 2019-12-18 MED FILL — ENBREL SURECLICK 50 MG/ML S: 50 | 28 days supply | Qty: 4 | Fill #0

## 2020-01-15 MED FILL — ENBREL SURECLICK 50 MG/ML S: 50 | 28 days supply | Qty: 4 | Fill #1

## 2020-02-01 ENCOUNTER — Encounter: Payer: Self-pay | Admitting: Internal Medicine

## 2020-02-01 MED ORDER — VALACYCLOVIR HCL 1 G PO TABS: 1000.0000 mg | ORAL_TABLET | Freq: Two times a day (BID) | ORAL | 3 refills | Status: DC

## 2020-02-01 MED FILL — valACYclovir HCL 1 GM TABS: 1 | 30 days supply | Qty: 60 | Fill #0

## 2020-02-01 MED FILL — ESCITALOPRAM 20 MG TABLET: 20 | 90 days supply | Qty: 45 | Fill #3

## 2020-02-01 NOTE — Telephone Encounter (Signed)
I have called pharmacy and ok'd early refill of lexapro.

## 2020-02-01 NOTE — Telephone Encounter (Signed)
Sent in valtrex, please call pharmacy and okay early refill of lexapro she should still have refills of this.

## 2020-02-19 MED FILL — ENBREL SURECLICK 50 MG/ML S: 50 | 28 days supply | Qty: 4 | Fill #2

## 2020-02-26 MED FILL — DICLOFENAC SOD EC 75 MG TAB: 75 | 30 days supply | Qty: 60 | Fill #1

## 2020-03-15 ENCOUNTER — Other Ambulatory Visit: Payer: Self-pay | Admitting: Pharmacist

## 2020-03-15 ENCOUNTER — Other Ambulatory Visit: Payer: Self-pay | Admitting: Internal Medicine

## 2020-03-15 MED ORDER — ENBREL SURECLICK 50 MG/ML ~~LOC~~ SOAJ: SUBCUTANEOUS | 1 refills | Status: DC

## 2020-03-15 MED FILL — ESCITALOPRAM 20 MG TABLET: 20 | 90 days supply | Qty: 45 | Fill #0

## 2020-03-27 MED FILL — ENBREL SURECLICK 50 MG/ML S: 50 | 28 days supply | Qty: 4 | Fill #0

## 2020-04-10 MED FILL — DICLOFENAC SOD EC 75 MG TAB: 75 | 30 days supply | Qty: 60 | Fill #2

## 2020-04-10 MED FILL — LEVOTHYROXINE SODIUM 25 MCG: 25 | 90 days supply | Qty: 90 | Fill #3

## 2020-04-10 MED FILL — LEFLUNOMIDE 20 MG TABLET: 20 | 90 days supply | Qty: 90 | Fill #0

## 2020-05-07 MED FILL — ENBREL SURECLICK 50 MG/ML S: 50 | 28 days supply | Qty: 4 | Fill #1

## 2020-05-15 ENCOUNTER — Other Ambulatory Visit: Payer: Self-pay | Admitting: Internal Medicine

## 2020-05-15 MED FILL — AMLODIPINE BESYLATE 5 MG TA: 5 | 90 days supply | Qty: 90 | Fill #0

## 2020-05-15 MED FILL — SOLIFENACIN SUCCINATE 10 MG: 10 | 90 days supply | Qty: 90 | Fill #0

## 2020-05-16 ENCOUNTER — Telehealth: Payer: Self-pay | Admitting: Internal Medicine

## 2020-05-16 MED ORDER — ESCITALOPRAM OXALATE 20 MG PO TABS: ORAL_TABLET | ORAL | 0 refills | Status: DC

## 2020-05-16 NOTE — Telephone Encounter (Signed)
[facility] - [place], [place] - [address]. Phone:  [phone]  Fax:  [phone]     escitalopram (LEXAPRO) 20 MG tablet Patient needs a refill, patient stated that Dr. Okey Dupre told the patient to take 1 whole pill instead of half and now she is out and needs a refill  Last seen-12/05/19 Next apt- 07/19/20

## 2020-05-16 NOTE — Addendum Note (Signed)
Addended by: Corwin Levins on: 05/16/2020 01:47 PM   Modules accepted: Orders

## 2020-05-16 NOTE — Telephone Encounter (Signed)
Ok this is done 

## 2020-05-17 MED FILL — ESCITALOPRAM 20 MG TABLET: 20 | 90 days supply | Qty: 45 | Fill #0

## 2020-05-21 ENCOUNTER — Ambulatory Visit: Payer: No Typology Code available for payment source | Admitting: Internal Medicine

## 2020-05-27 ENCOUNTER — Other Ambulatory Visit: Payer: Self-pay | Admitting: Internal Medicine

## 2020-05-27 DIAGNOSIS — Z1231 Encounter for screening mammogram for malignant neoplasm of breast: Secondary | ICD-10-CM

## 2020-05-31 ENCOUNTER — Other Ambulatory Visit: Payer: Self-pay | Admitting: Pharmacist

## 2020-05-31 MED ORDER — ENBREL SURECLICK 50 MG/ML ~~LOC~~ SOAJ: SUBCUTANEOUS | 1 refills | Status: DC

## 2020-05-31 MED FILL — DICLOFENAC SOD EC 75 MG TAB: 75 | 30 days supply | Qty: 60 | Fill #0

## 2020-06-06 MED FILL — ENBREL SURECLICK 50 MG/ML S: 50 | 28 days supply | Qty: 4 | Fill #0

## 2020-06-17 ENCOUNTER — Other Ambulatory Visit: Payer: Self-pay

## 2020-06-17 ENCOUNTER — Ambulatory Visit
Admission: EM | Admit: 2020-06-17 | Discharge: 2020-06-17 | Disposition: A | Discharge status: Home / Self Care | Payer: No Typology Code available for payment source | Attending: Emergency Medicine | Admitting: Emergency Medicine

## 2020-06-17 DIAGNOSIS — Z1152 Encounter for screening for COVID-19: Secondary | ICD-10-CM

## 2020-06-17 MED FILL — ESCITALOPRAM 20 MG TABLET: 20 | 90 days supply | Qty: 45 | Fill #1

## 2020-06-17 NOTE — ED Triage Notes (Signed)
covid screening for travel

## 2020-06-19 LAB — NOVEL CORONAVIRUS, NAA: SARS-CoV-2, NAA: NOT DETECTED

## 2020-06-19 LAB — SARS-COV-2, NAA 2 DAY TAT

## 2020-06-26 ENCOUNTER — Ambulatory Visit
Admission: EM | Admit: 2020-06-26 | Discharge: 2020-06-26 | Disposition: A | Discharge status: Home / Self Care | Payer: No Typology Code available for payment source | Attending: Emergency Medicine | Admitting: Emergency Medicine

## 2020-06-26 ENCOUNTER — Encounter: Payer: Self-pay | Admitting: Emergency Medicine

## 2020-06-26 ENCOUNTER — Other Ambulatory Visit: Payer: Self-pay

## 2020-06-26 ENCOUNTER — Telehealth: Payer: No Typology Code available for payment source | Admitting: Nurse Practitioner

## 2020-06-26 DIAGNOSIS — H6593 Unspecified nonsuppurative otitis media, bilateral: Secondary | ICD-10-CM | POA: Diagnosis not present

## 2020-06-26 DIAGNOSIS — H9203 Otalgia, bilateral: Secondary | ICD-10-CM

## 2020-06-26 DIAGNOSIS — R599 Enlarged lymph nodes, unspecified: Secondary | ICD-10-CM

## 2020-06-26 MED ORDER — AMOXICILLIN-POT CLAVULANATE 875-125 MG PO TABS: 1.0000 | ORAL_TABLET | Freq: Two times a day (BID) | ORAL | 0 refills | Status: DC

## 2020-06-26 NOTE — ED Provider Notes (Signed)
[facility]   [id number] 06/26/20 Arrival Time: 1640  CC:EAR PAIN  SUBJECTIVE: History from: patient.  Maria Wiley is a 44 y.o. female presented to the urgent care for complaint of right ear pain and right occipital lymph node swelling for the past 6 days.  Denies a precipitating event, such as swimming or wearing ear plugs.  Patient states the pain is constant and achy in character.  Patient has tried OTC medication without relief.  Symptoms are made worse with lying down.  Denies similar symptoms in the past.    Denies fever, chills, fatigue, sinus pain, rhinorrhea, ear discharge, sore throat, SOB, wheezing, chest pain, nausea, changes in bowel or bladder habits.    ROS: As per HPI.  All other pertinent ROS negative.      Past Medical History:  Diagnosis Date  . Ankylosing spondylitis (HCC)   . Chest pain   . COVID-19 06/2020  . GERD (gastroesophageal reflux disease)   . Hypertension   . Migraines   . RA (rheumatoid arthritis) (HCC)   . Thyroid disease   . Urinary incontinence    Past Surgical History:  Procedure Laterality Date  . ABDOMINAL HYSTERECTOMY    . APPENDECTOMY    . CHOLECYSTECTOMY    . HERNIA REPAIR  2004  . KNEE SURGERY Right 2006   No Known Allergies No current facility-administered medications on file prior to encounter.   Current Outpatient Medications on File Prior to Encounter  Medication Sig Dispense Refill  . amLODipine (NORVASC) 5 MG tablet TAKE 1 TABLET (5 MG TOTAL) BY MOUTH DAILY. 90 tablet 0  . benzonatate (TESSALON) 100 MG capsule Take 1 capsule (100 mg total) by mouth every 8 (eight) hours. 21 capsule 0  . cholecalciferol (VITAMIN D) 1000 units tablet Take 1,000 Units by mouth daily.    [name] (AIMOVIG) 140 MG/ML SOAJ Inject 140 mg into the skin every 30 (thirty) days. 1 pen 11  . escitalopram (LEXAPRO) 20 MG tablet TAKE 1/2 TABLETS BY MOUTH ONCE A DAY 90 tablet 0  . etanercept (ENBREL SURECLICK) 50 MG/ML  injection Inject 1 ml under the skin each week as directed. 4 mL 1  . fluconazole (DIFLUCAN) 150 MG tablet Take 1 tablet (150 mg total) by mouth daily. Take one tablet today.  May repeat in 3 days. 2 tablet 0  . folic acid (FOLVITE) 1 MG tablet Take 1 tablet (1 mg total) by mouth daily. 90 tablet 3  . leflunomide (ARAVA) 20 MG tablet Take 20 mg by mouth daily.    Marland Kitchen levothyroxine (SYNTHROID) 25 MCG tablet TAKE 1 TABLET BY MOUTH DAILY BEFORE BREAKFAST 90 tablet 3  . LINZESS 145 MCG CAPS capsule Take 145 mcg by mouth daily as needed.     . Magnesium 250 MG TABS Take 250 mg by mouth daily.    . ondansetron (ZOFRAN) 4 MG tablet Take 1 tablet (4 mg total) by mouth every 8 (eight) hours as needed for nausea or vomiting. 6 tablet 0  . ondansetron (ZOFRAN-ODT) 4 MG disintegrating tablet Take 1 tablet (4 mg total) by mouth every 8 (eight) hours as needed for nausea. 30 tablet 3  . rizatriptan (MAXALT-MLT) 10 MG disintegrating tablet Take 1 tablet (10 mg total) by mouth as needed for migraine. May repeat in 2 hours if needed 9 tablet 11  . solifenacin (VESICARE) 10 MG tablet TAKE 1 TABLET (10 MG TOTAL) BY MOUTH DAILY. 90 tablet 0  . tobramycin (TOBREX) 0.3 % ophthalmic solution  Place 1 drop into both eyes every 4 (four) hours. 5 mL 0  . tretinoin (RETIN-A) 0.025 % cream APPLY AA QHS    . valACYclovir (VALTREX) 1000 MG tablet Take 1 tablet (1,000 mg total) by mouth 2 (two) times daily. 60 tablet 3   Social History   Socioeconomic History  . Marital status: Married    Spouse name: Not on file  . Number of children: 4  . Years of education: Not on file  . Highest education level: Bachelor's degree (e.g., BA, AB, BS)  Occupational History  . Not on file  Tobacco Use  . Smoking status: Never Smoker  . Smokeless tobacco: Never Used  Vaping Use  . Vaping Use: Never used  Substance and Sexual Activity  . Alcohol use: Never  . Drug use: Never  . Sexual activity: Yes    Birth control/protection:  Surgical  Other Topics Concern  . Not on file  Social History Narrative   Lives at home with her husband & her 4 sons   Right handed   Caffeine: 2-3 cups per day   Social Determinants of Health   Financial Resource Strain:   . Difficulty of Paying Living Expenses: Not on file  Food Insecurity:   . Worried About Programme researcher, broadcasting/film/video in the Last Year: Not on file  . Ran Out of Food in the Last Year: Not on file  Transportation Needs:   . Lack of Transportation (Medical): Not on file  . Lack of Transportation (Non-Medical): Not on file  Physical Activity:   . Days of Exercise per Week: Not on file  . Minutes of Exercise per Session: Not on file  Stress:   . Feeling of Stress : Not on file  Social Connections:   . Frequency of Communication with Friends and Family: Not on file  . Frequency of Social Gatherings with Friends and Family: Not on file  . Attends Religious Services: Not on file  . Active Member of Clubs or Organizations: Not on file  . Attends Banker Meetings: Not on file  . Marital Status: Not on file  Intimate Partner Violence:   . Fear of Current or Ex-Partner: Not on file  . Emotionally Abused: Not on file  . Physically Abused: Not on file  . Sexually Abused: Not on file   Family History  Problem Relation Age of Onset  . CAD Maternal Grandfather   . Diabetes Maternal Grandfather   . Hearing loss Maternal Grandfather   . Heart disease Maternal Grandfather   . Hyperlipidemia Maternal Grandfather   . Hypertension Mother   . Arthritis Mother   . Arthritis Maternal Grandmother   . Heart disease Maternal Grandmother   . Hyperlipidemia Maternal Grandmother   . Migraines Neg Hx   . Headache Neg Hx     OBJECTIVE:  Vitals:   06/26/20 1649  BP: 135/85  Pulse: 67  Resp: 16  Temp: 98.4 F (36.9 C)  SpO2: 98%     Physical Exam Vitals and nursing note reviewed.  Constitutional:      General: She is not in acute distress.    Appearance:  Normal appearance. She is normal weight. She is not ill-appearing, toxic-appearing or diaphoretic.  HENT:     Head: Normocephalic.     Right Ear: A middle ear effusion is present.     Left Ear: A middle ear effusion is present.  Cardiovascular:     Rate and Rhythm: Normal rate and regular  rhythm.     Pulses: Normal pulses.     Heart sounds: Normal heart sounds. No murmur heard.  No friction rub. No gallop.   Pulmonary:     Effort: Pulmonary effort is normal. No respiratory distress.     Breath sounds: Normal breath sounds. No stridor. No wheezing, rhonchi or rales.  Chest:     Chest wall: No tenderness.  Lymphadenopathy:     Head:     Right side of head: Tonsillar adenopathy present.  Neurological:     Mental Status: She is alert and oriented to person, place, and time.      Imaging: No results found.   ASSESSMENT & PLAN:  1. Middle ear effusion, bilateral   2. Swelling of lymph node     Meds ordered this encounter  Medications  . amoxicillin-clavulanate (AUGMENTIN) 875-125 MG tablet    Sig: Take 1 tablet by mouth every 12 (twelve) hours.    Dispense:  14 tablet    Refill:  0   Discharge instructions  Rest and drink plenty of fluids Prescribed augmentin 875 for 7 days Continue to use Flonase for middle ear effusion  Take medications as directed and to completion Continue to use OTC ibuprofen and/ or tylenol as needed for pain control Follow up with PCP if symptoms persists Return here or go to the ER if you have any new or worsening symptoms   Reviewed expectations re: course of current medical issues. Questions answered. Outlined signs and symptoms indicating need for more acute intervention. Patient verbalized understanding. After Visit Summary given.         Durward Parcel, FNP 06/26/20 1715

## 2020-06-26 NOTE — Progress Notes (Signed)
Based on what you shared with me it looks like you have ear pain,that should be evaluated in a face to face office visit. You need someone to look in your ears for proper treatment.    NOTE: If you entered your credit card information for this eVisit, you will not be charged. You may see a "hold" on your card for the $35 but that hold will drop off and you will not have a charge processed.  If you are having a true medical emergency please call 911.     For an urgent face to face visit, [place] has four urgent care centers for your convenience:   . [facility]    [phone]                  Get Driving Directions  [address] [place], [place] [address] . 10 am to 8 pm Monday-Friday . 12 pm to 8 pm Saturday-Sunday   . [facility] at [facility]  [phone]                  Get Driving Directions  [address] [place], [place] [address] . 8 am to 8 pm Monday-Friday . 9 am to 6 pm Saturday . 11 am to 6 pm Sunday   . [facility] at [facility]  [phone]                  Get Driving Directions   [address] [place], [place] [address] . 8 am to 8 pm Monday-Friday . 8 am to 4 pm Saturday-Sunday    . [facility] at [facility] Directions  [phone]  [address], [address] [place], [place] [address]  . Monday-Friday, 12 PM to 6 PM    Your e-visit answers were reviewed by a board certified advanced clinical practitioner to complete your personal care plan.  Thank you for using e-Visits.

## 2020-06-26 NOTE — Discharge Instructions (Addendum)
Rest and drink plenty of fluids Prescribed augmentin 875 for 7 days Continue to use Flonase for middle ear effusion  Take medications as directed and to completion Continue to use OTC ibuprofen and/ or tylenol as needed for pain control Follow up with PCP if symptoms persists Return here or go to the ER if you have any new or worsening symptoms

## 2020-06-26 NOTE — ED Triage Notes (Signed)
Pt said she has been having right ear pain x 6 days and said her  lymph nodes are swollen in the neck. No fevers,

## 2020-07-09 ENCOUNTER — Other Ambulatory Visit: Payer: Self-pay

## 2020-07-09 ENCOUNTER — Ambulatory Visit
Admission: RE | Admit: 2020-07-09 | Discharge: 2020-07-09 | Disposition: A | Discharge status: Home / Self Care | Payer: No Typology Code available for payment source | Source: Ambulatory Visit | Attending: Internal Medicine | Admitting: Internal Medicine

## 2020-07-09 DIAGNOSIS — Z1231 Encounter for screening mammogram for malignant neoplasm of breast: Secondary | ICD-10-CM

## 2020-07-09 MED FILL — ENBREL SURECLICK 50 MG/ML S: 50 | 28 days supply | Qty: 4 | Fill #1

## 2020-07-19 ENCOUNTER — Encounter: Payer: No Typology Code available for payment source | Admitting: Internal Medicine

## 2020-07-22 ENCOUNTER — Other Ambulatory Visit: Payer: Self-pay | Admitting: Internal Medicine

## 2020-07-22 DIAGNOSIS — E039 Hypothyroidism, unspecified: Secondary | ICD-10-CM

## 2020-07-22 MED FILL — DICLOFENAC SOD EC 75 MG TAB: 75 | 30 days supply | Qty: 60 | Fill #1

## 2020-07-22 MED FILL — LEVOTHYROXINE SODIUM 25 MCG: 25 | 30 days supply | Qty: 30 | Fill #0

## 2020-07-24 ENCOUNTER — Other Ambulatory Visit (HOSPITAL_COMMUNITY): Payer: Self-pay | Admitting: Physician Assistant

## 2020-07-24 MED FILL — LEFLUNOMIDE 20 MG TABLET: 20 | 90 days supply | Qty: 90 | Fill #0

## 2020-07-29 ENCOUNTER — Telehealth: Payer: Self-pay | Admitting: Internal Medicine

## 2020-07-29 NOTE — Telephone Encounter (Signed)
Pharmacy called and state the patient has been a whole tablet daily not a half. That she was told to up it. And she is out of medication.   Pharmacy would like to know how is this medication supposed to be taken.  escitalopram (LEXAPRO) 20 MG tablet  [facility] [place], [place] - [address] [facility]. Phone:  [phone]  Fax:  [phone]

## 2020-07-31 ENCOUNTER — Other Ambulatory Visit: Payer: Self-pay

## 2020-07-31 ENCOUNTER — Other Ambulatory Visit: Payer: Self-pay | Admitting: Internal Medicine

## 2020-07-31 MED ORDER — ESCITALOPRAM OXALATE 20 MG PO TABS
ORAL_TABLET | ORAL | 0 refills | Status: DC
Start: 2020-07-31 — End: 2020-10-14

## 2020-07-31 MED FILL — ESCITALOPRAM 20 MG TABLET: 20 | 90 days supply | Qty: 90 | Fill #0

## 2020-07-31 NOTE — Telephone Encounter (Signed)
Pt informed of below.  OV is scheduled 08/16/20.

## 2020-07-31 NOTE — Telephone Encounter (Signed)
I have not seen her since 05/2019 so it is unclear who told her to change. Would need to address at visit but we can refill at current sig we have recorded until visit.

## 2020-08-01 ENCOUNTER — Other Ambulatory Visit: Payer: Self-pay | Admitting: Pharmacist

## 2020-08-01 ENCOUNTER — Other Ambulatory Visit: Payer: Self-pay | Admitting: Internal Medicine

## 2020-08-01 MED ORDER — ENBREL SURECLICK 50 MG/ML ~~LOC~~ SOAJ
SUBCUTANEOUS | 1 refills | Status: DC
Start: 2020-08-01 — End: 2020-10-10

## 2020-08-07 ENCOUNTER — Encounter: Payer: Self-pay | Admitting: Internal Medicine

## 2020-08-07 ENCOUNTER — Ambulatory Visit (INDEPENDENT_AMBULATORY_CARE_PROVIDER_SITE_OTHER): Payer: No Typology Code available for payment source

## 2020-08-07 ENCOUNTER — Ambulatory Visit (INDEPENDENT_AMBULATORY_CARE_PROVIDER_SITE_OTHER): Payer: No Typology Code available for payment source | Admitting: Internal Medicine

## 2020-08-07 ENCOUNTER — Other Ambulatory Visit: Payer: Self-pay

## 2020-08-07 VITALS — BP 146/92 | HR 84 | Temp 98.1°F | Resp 16 | Wt 232.0 lb

## 2020-08-07 DIAGNOSIS — M2391 Unspecified internal derangement of right knee: Secondary | ICD-10-CM

## 2020-08-07 DIAGNOSIS — S8991XA Unspecified injury of right lower leg, initial encounter: Secondary | ICD-10-CM | POA: Diagnosis not present

## 2020-08-07 NOTE — Patient Instructions (Signed)
Knee Sprain, Adult  A knee sprain is a stretch or tear in a knee ligament. Knee ligaments are bands of tissue that connect bones in the knee to each other. What are the causes? This condition often results from:  A fall.  An injury to the knee. What are the signs or symptoms? Symptoms of this condition include:  Trouble bending the leg.  Swelling in the knee.  Bruising around the knee.  Tenderness or pain in the knee.  Muscle spasms around the knee. How is this diagnosed? This condition may be diagnosed based on:  A physical exam.  What happened just before you started to have symptoms.  Tests, including: ? An X-ray. This may be done to make sure no bones are broken. ? An MRI. This may be done to check if the ligament is torn. ? Stress testing of the knee. This may be done to check ligament damage. How is this treated? Treatment for this condition may involve:  Keeping the knee still (immobilized) with a cast, brace, or splint.  Applying ice to the knee. This helps with pain and swelling.  Keeping the knee raised (elevated) above the level of your heart when you are resting. This helps with pain and swelling.  Taking medicine for pain.  Exercises to prevent or limit permanent weakness or stiffness in your knee.  Surgery to reconnect the ligament to the bone or to reconstruct it. This may be needed if the ligament tore all the way. Follow these instructions at home: If you have a splint or brace:  Wear the splint or brace as told by your health care provider. Remove it only as told by your health care provider.  Loosen the splint or brace if your toes tingle, become numb, or turn cold and blue.  Keep the splint or brace clean.  If the splint or brace is not waterproof: ? Do not let it get wet. ? Cover it with a watertight covering when you take a bath or a shower. If you have a cast:  Do not stick anything inside the cast to scratch your skin. Doing that  increases your risk of infection.  Check the skin around the cast every day. Tell your health care provider about any concerns.  You may put lotion on dry skin around the edges of the cast. Do not put lotion on the skin underneath the cast.  Keep the cast clean.  If the cast is not waterproof: ? Do not let it get wet. ? Cover it with a watertight covering when you take a bath or a shower. Managing pain, stiffness, and swelling   If directed, put ice on the injured area. ? If you have a removable splint or brace, remove it as told by your health care provider. ? Put ice in a plastic bag. ? Place a towel between your skin and the bag or between your cast and the bag. ? Leave the ice on for 20 minutes, 2-3 times a day.  Gently move your toes often to avoid stiffness and to lessen swelling.  Elevate the injured area above the level of your heart while you are sitting or lying down.  Take over-the-counter and prescription medicines only as told by your health care provider. General instructions  Do exercises as told by your health care provider.  Keep all follow-up visits as told by your health care provider. This is important. Contact a health care provider if:  You have pain that gets worse.    The cast, brace, or splint does not fit right.  The cast, brace, or splint gets damaged. Get help right away if:  You cannot use your injured joint to support any of your body weight (cannot bear weight).  You cannot move the injured joint.  You cannot walk more than a few steps without pain or without your knee buckling.  You have significant pain, swelling, or numbness below the cast, brace, or splint. This information is not intended to replace advice given to you by your health care provider. Make sure you discuss any questions you have with your health care provider. Document Revised: 11/11/2018 Document Reviewed: 02/07/2016 Elsevier Patient Education  2020 Elsevier Inc.  

## 2020-08-07 NOTE — Progress Notes (Signed)
Subjective:  Patient ID: Maria Wiley, female    DOB: Oct 20, 1975  Age: 45 y.o. MRN: [id number]  CC: Knee Injury  This visit occurred during the SARS-CoV-2 public health emergency.  Safety protocols were in place, including screening questions prior to the visit, additional usage of staff PPE, and extensive cleaning of exam room while observing appropriate contact time as indicated for disinfecting solutions.    HPI Maria Wiley presents for concerns about her right knee.  She complains of 1 day prior to this visit she was trying to get into a truck and she feels like something popped in her right knee.  She now has a painful pulling sensation and feels like the knee is unstable.  She is controlling the pain with an anti-inflammatory.  She is noted some swelling over the lateral aspect of the right knee.  Outpatient Medications Prior to Visit  Medication Sig Dispense Refill  . amLODipine (NORVASC) 5 MG tablet TAKE 1 TABLET (5 MG TOTAL) BY MOUTH DAILY. 90 tablet 0  . cholecalciferol (VITAMIN D) 1000 units tablet Take 1,000 Units by mouth daily.    [name] (AIMOVIG) 140 MG/ML SOAJ Inject 140 mg into the skin every 30 (thirty) days. 1 pen 11  . escitalopram (LEXAPRO) 20 MG tablet TAKE 1 TABLETS BY MOUTH ONCE A DAY 90 tablet 0  . etanercept (ENBREL SURECLICK) 50 MG/ML injection Inject 1 ml under the skin each week as directed. 4 mL 1  . folic acid (FOLVITE) 1 MG tablet Take 1 tablet (1 mg total) by mouth daily. 90 tablet 3  . leflunomide (ARAVA) 20 MG tablet Take 20 mg by mouth daily.    Marland Kitchen levothyroxine (SYNTHROID) 25 MCG tablet Take 1 tablet (25 mcg total) by mouth daily before breakfast. Pt needs physical for future refills. 30 tablet 0  . LINZESS 145 MCG CAPS capsule Take 145 mcg by mouth daily as needed.     . Magnesium 250 MG TABS Take 250 mg by mouth daily.    . ondansetron (ZOFRAN) 4 MG tablet Take 1 tablet (4 mg total) by mouth every 8 (eight) hours as needed  for nausea or vomiting. 6 tablet 0  . ondansetron (ZOFRAN-ODT) 4 MG disintegrating tablet Take 1 tablet (4 mg total) by mouth every 8 (eight) hours as needed for nausea. 30 tablet 3  . rizatriptan (MAXALT-MLT) 10 MG disintegrating tablet Take 1 tablet (10 mg total) by mouth as needed for migraine. May repeat in 2 hours if needed 9 tablet 11  . solifenacin (VESICARE) 10 MG tablet TAKE 1 TABLET (10 MG TOTAL) BY MOUTH DAILY. 90 tablet 0  . tretinoin (RETIN-A) 0.025 % cream APPLY AA QHS    . valACYclovir (VALTREX) 1000 MG tablet Take 1 tablet (1,000 mg total) by mouth 2 (two) times daily. 60 tablet 3  . amoxicillin-clavulanate (AUGMENTIN) 875-125 MG tablet Take 1 tablet by mouth every 12 (twelve) hours. 14 tablet 0  . benzonatate (TESSALON) 100 MG capsule Take 1 capsule (100 mg total) by mouth every 8 (eight) hours. 21 capsule 0  . fluconazole (DIFLUCAN) 150 MG tablet Take 1 tablet (150 mg total) by mouth daily. Take one tablet today.  May repeat in 3 days. 2 tablet 0  . tobramycin (TOBREX) 0.3 % ophthalmic solution Place 1 drop into both eyes every 4 (four) hours. 5 mL 0   No facility-administered medications prior to visit.    ROS Review of Systems  Musculoskeletal: Positive for arthralgias. Negative for back  pain.  All other systems reviewed and are negative.   Objective:  BP (!) 146/92   Pulse 84   Temp 98.1 F (36.7 C) (Oral)   Resp 16   Wt 232 lb (105.2 kg)   SpO2 95%   BMI 36.34 kg/m   BP Readings from Last 3 Encounters:  08/07/20 (!) 146/92  06/26/20 135/85  12/05/19 124/73    Wt Readings from Last 3 Encounters:  08/07/20 232 lb (105.2 kg)  12/05/19 228 lb (103.4 kg)  05/10/19 226 lb (102.5 kg)    Physical Exam Musculoskeletal:     Right knee: Swelling and bony tenderness present. No deformity. Normal range of motion. Tenderness present over the lateral joint line. LCL laxity present.     Instability Tests: Anterior drawer test negative.     Left knee: Normal.      Lab Results  Component Value Date   WBC 5.7 05/08/2019   HGB 10.7 (L) 05/08/2019   HCT 33.3 (L) 05/08/2019   PLT 225.0 05/08/2019   GLUCOSE 128 (H) 05/08/2019   CHOL 176 10/11/2018   TRIG 108.0 10/11/2018   HDL 38.80 (L) 10/11/2018   LDLCALC 116 (H) 10/11/2018   ALT 6 05/08/2019   AST 11 05/08/2019   NA 137 05/08/2019   K 3.9 05/08/2019   CL 105 05/08/2019   CREATININE 0.62 05/08/2019   BUN 14 05/08/2019   CO2 27 05/08/2019   TSH 0.80 10/11/2018   HGBA1C 5.7 10/11/2018    MM 3D SCREEN BREAST BILATERAL  Result Date: 07/11/2020 CLINICAL DATA:  Screening. EXAM: DIGITAL SCREENING BILATERAL MAMMOGRAM WITH TOMO AND CAD COMPARISON:  Previous exam(s). ACR Breast Density Category c: The breast tissue is heterogeneously dense, which may obscure small masses. FINDINGS: There are no findings suspicious for malignancy. Images were processed with CAD. IMPRESSION: No mammographic evidence of malignancy. A result letter of this screening mammogram will be mailed directly to the patient. RECOMMENDATION: Screening mammogram in one year. (Code:SM-B-01Y) BI-RADS CATEGORY  1: Negative. Electronically Signed   By: Evangeline Dakin M.D.   On: 07/11/2020 15:50   DG Knee Complete 4 Views Right  Result Date: 08/07/2020 CLINICAL DATA:  Right knee injury.  Lateral pain and swelling. EXAM: RIGHT KNEE - COMPLETE 4+ VIEW COMPARISON:  None. FINDINGS: No evidence of fracture, dislocation, or joint effusion. Minimal spurring of the medial compartment. Soft tissues are unremarkable. IMPRESSION: No acute abnormality of the right knee Electronically Signed   By: Miachel Roux M.D.   On: 08/07/2020 15:21    Assessment & Plan:   [name] was seen today for knee injury.  Diagnoses and all orders for this visit:  Injury of right knee, initial encounter- She describes a significant knee injury and now has pain and swelling over the lateral aspect of the knee.  The plain films are negative for fracture.  I am  concerned there might be some injury to the lateral meniscus or lateral collateral ligament.  I therefore recommended that she undergo an MRI of the right knee. -     DG Knee Complete 4 Views Right; Future -     MR Knee Right Wo Contrast; Future  Acute internal derangement of right knee- See above. -     MR Knee Right Wo Contrast; Future   I have discontinued [name] "[name]"'s benzonatate, tobramycin, fluconazole, and amoxicillin-clavulanate. I am also having her maintain her Linzess, leflunomide, cholecalciferol, Magnesium, folic acid, Erenumab-aooe, ondansetron, tretinoin, rizatriptan, ondansetron, valACYclovir, amLODipine, solifenacin, levothyroxine, escitalopram,  and Enbrel SureClick.  No orders of the defined types were placed in this encounter.    Follow-up: Return if symptoms worsen or fail to improve.  Sanda Linger, MD

## 2020-08-10 DIAGNOSIS — M2391 Unspecified internal derangement of right knee: Secondary | ICD-10-CM | POA: Insufficient documentation

## 2020-08-11 ENCOUNTER — Encounter: Payer: Self-pay | Admitting: Internal Medicine

## 2020-08-12 MED FILL — ENBREL SURECLICK 50 MG/ML S: 50 | 28 days supply | Qty: 4 | Fill #0

## 2020-08-15 MED FILL — DICLOFENAC SOD EC 75 MG TAB: 75 | 30 days supply | Qty: 60 | Fill #2

## 2020-08-16 ENCOUNTER — Ambulatory Visit (INDEPENDENT_AMBULATORY_CARE_PROVIDER_SITE_OTHER): Payer: No Typology Code available for payment source | Admitting: Internal Medicine

## 2020-08-16 ENCOUNTER — Encounter: Payer: Self-pay | Admitting: Internal Medicine

## 2020-08-16 ENCOUNTER — Other Ambulatory Visit: Payer: Self-pay

## 2020-08-16 VITALS — BP 138/92 | HR 78 | Temp 98.5°F | Ht 67.0 in | Wt 235.6 lb

## 2020-08-16 DIAGNOSIS — E039 Hypothyroidism, unspecified: Secondary | ICD-10-CM | POA: Diagnosis not present

## 2020-08-16 DIAGNOSIS — I10 Essential (primary) hypertension: Secondary | ICD-10-CM | POA: Diagnosis not present

## 2020-08-16 DIAGNOSIS — M0600 Rheumatoid arthritis without rheumatoid factor, unspecified site: Secondary | ICD-10-CM | POA: Diagnosis not present

## 2020-08-16 DIAGNOSIS — G43709 Chronic migraine without aura, not intractable, without status migrainosus: Secondary | ICD-10-CM

## 2020-08-16 DIAGNOSIS — Z0000 Encounter for general adult medical examination without abnormal findings: Secondary | ICD-10-CM | POA: Diagnosis not present

## 2020-08-16 LAB — VITAMIN D 25 HYDROXY (VIT D DEFICIENCY, FRACTURES): VITD: 29.91 ng/mL — ABNORMAL LOW (ref 30.00–100.00)

## 2020-08-16 LAB — T4, FREE: Free T4: 0.94 ng/dL (ref 0.60–1.60)

## 2020-08-16 LAB — LIPID PANEL
Cholesterol: 196 mg/dL (ref 0–200)
HDL: 40.7 mg/dL (ref >39.00)
LDL Cholesterol: 115 mg/dL — ABNORMAL HIGH (ref 0–99)
NonHDL: 155.38
Total CHOL/HDL Ratio: 5
Triglycerides: 200 mg/dL — ABNORMAL HIGH (ref 0.0–149.0)
VLDL: 40 mg/dL (ref 0.0–40.0)

## 2020-08-16 LAB — COMPREHENSIVE METABOLIC PANEL
ALT: 7 U/L (ref 0–35)
AST: 13 U/L (ref 0–37)
Albumin: 4.1 g/dL (ref 3.5–5.2)
Alkaline Phosphatase: 59 U/L (ref 39–117)
BUN: 11 mg/dL (ref 6–23)
CO2: 26 mEq/L (ref 19–32)
Calcium: 8.9 mg/dL (ref 8.4–10.5)
Chloride: 103 mEq/L (ref 96–112)
Creatinine, Ser: 0.79 mg/dL (ref 0.40–1.20)
GFR: 90.02 mL/min (ref >60.00)
Glucose, Bld: 120 mg/dL — ABNORMAL HIGH (ref 70–99)
Potassium: 3.4 mEq/L — ABNORMAL LOW (ref 3.5–5.1)
Sodium: 138 mEq/L (ref 135–145)
Total Bilirubin: 0.3 mg/dL (ref 0.2–1.2)
Total Protein: 7.2 g/dL (ref 6.0–8.3)

## 2020-08-16 LAB — CBC
HCT: 34.9 % — ABNORMAL LOW (ref 36.0–46.0)
Hemoglobin: 11.2 g/dL — ABNORMAL LOW (ref 12.0–15.0)
MCHC: 32.1 g/dL (ref 30.0–36.0)
MCV: 79 fl (ref 78.0–100.0)
Platelets: 243 10*3/uL (ref 150.0–400.0)
RBC: 4.42 Mil/uL (ref 3.87–5.11)
RDW: 14.7 % (ref 11.5–15.5)
WBC: 7.6 10*3/uL (ref 4.0–10.5)

## 2020-08-16 LAB — TSH: TSH: 0.88 u[IU]/mL (ref 0.35–4.50)

## 2020-08-16 LAB — HEMOGLOBIN A1C: Hgb A1c MFr Bld: 5.7 % (ref 4.6–6.5)

## 2020-08-16 NOTE — Assessment & Plan Note (Signed)
Flu shot up to date. Covid-19 up to date. Tetanus up to date. Colonoscopy up to date. Mammogram up to date, pap smear not indicated. Counseled about sun safety and mole surveillance. Counseled about the dangers of distracted driving. Given 10 year screening recommendations.

## 2020-08-16 NOTE — Progress Notes (Signed)
   Subjective:   Patient ID: Maria Wiley, female    DOB: Oct 29, 1975, 45 y.o.   MRN: [id number]  HPI  The patient is a 45 YO female coming in for physical.   PMH, [facility], social history reviewed and updated  Colonoscopy 2015 back in 10 years  Review of Systems  Constitutional: Negative.   HENT: Negative.   Eyes: Negative.   Respiratory: Negative for cough, chest tightness and shortness of breath.   Cardiovascular: Negative for chest pain, palpitations and leg swelling.  Gastrointestinal: Negative for abdominal distention, abdominal pain, constipation, diarrhea, nausea and vomiting.  Musculoskeletal: Negative.   Skin: Negative.   Neurological: Negative.   Psychiatric/Behavioral: Negative.     Objective:  Physical Exam Constitutional:      Appearance: She is well-developed and well-nourished.  HENT:     Head: Normocephalic and atraumatic.  Eyes:     Extraocular Movements: EOM normal.  Cardiovascular:     Rate and Rhythm: Normal rate and regular rhythm.  Pulmonary:     Effort: Pulmonary effort is normal. No respiratory distress.     Breath sounds: Normal breath sounds. No wheezing or rales.  Abdominal:     General: Bowel sounds are normal. There is no distension.     Palpations: Abdomen is soft.     Tenderness: There is no abdominal tenderness. There is no rebound.  Musculoskeletal:        General: No edema.     Cervical back: Normal range of motion.  Skin:    General: Skin is warm and dry.  Neurological:     Mental Status: She is alert and oriented to person, place, and time.     Coordination: Coordination normal.  Psychiatric:        Mood and Affect: Mood and affect normal.     Vitals:   08/16/20 1451  BP: (!) 138/92  Pulse: 78  Temp: 98.5 F (36.9 C)  TempSrc: Oral  SpO2: 95%  Weight: 235 lb 9.6 oz (106.9 kg)  Height: 5\' 7"  (1.702 m)    This visit occurred during the SARS-CoV-2 public health emergency.  Safety protocols were in place, including  screening questions prior to the visit, additional usage of staff PPE, and extensive cleaning of exam room while observing appropriate contact time as indicated for disinfecting solutions.   Assessment & Plan:

## 2020-08-16 NOTE — Assessment & Plan Note (Addendum)
On arava and embrel, seeing rheumatology.

## 2020-08-16 NOTE — Patient Instructions (Signed)
For the weight loss wegovy and saxenda are the injections. Contrave and qsymia are the pills.   We will check the labs today.   Health Maintenance, Female Adopting a healthy lifestyle and getting preventive care are important in promoting health and wellness. Ask your health care provider about:  The right schedule for you to have regular tests and exams.  Things you can do on your own to prevent diseases and keep yourself healthy. What should I know about diet, weight, and exercise? Eat a healthy diet  Eat a diet that includes plenty of vegetables, fruits, low-fat dairy products, and lean protein.  Do not eat a lot of foods that are high in solid fats, added sugars, or sodium.   Maintain a healthy weight Body mass index (BMI) is used to identify weight problems. It estimates body fat based on height and weight. Your health care provider can help determine your BMI and help you achieve or maintain a healthy weight. Get regular exercise Get regular exercise. This is one of the most important things you can do for your health. Most adults should:  Exercise for at least 150 minutes each week. The exercise should increase your heart rate and make you sweat (moderate-intensity exercise).  Do strengthening exercises at least twice a week. This is in addition to the moderate-intensity exercise.  Spend less time sitting. Even light physical activity can be beneficial. Watch cholesterol and blood lipids Have your blood tested for lipids and cholesterol at 46 years of age, then have this test every 5 years. Have your cholesterol levels checked more often if:  Your lipid or cholesterol levels are high.  You are older than 46 years of age.  You are at high risk for heart disease. What should I know about cancer screening? Depending on your health history and family history, you may need to have cancer screening at various ages. This may include screening for:  Breast cancer.  Cervical  cancer.  Colorectal cancer.  Skin cancer.  Lung cancer. What should I know about heart disease, diabetes, and high blood pressure? Blood pressure and heart disease  High blood pressure causes heart disease and increases the risk of stroke. This is more likely to develop in people who have high blood pressure readings, are of African descent, or are overweight.  Have your blood pressure checked: ? Every 3-5 years if you are 59-41 years of age. ? Every year if you are 75 years old or older. Diabetes Have regular diabetes screenings. This checks your fasting blood sugar level. Have the screening done:  Once every three years after age 77 if you are at a normal weight and have a low risk for diabetes.  More often and at a younger age if you are overweight or have a high risk for diabetes. What should I know about preventing infection? Hepatitis B If you have a higher risk for hepatitis B, you should be screened for this virus. Talk with your health care provider to find out if you are at risk for hepatitis B infection. Hepatitis C Testing is recommended for:  Everyone born from [birth year] through 1966.  Anyone with known risk factors for hepatitis C. Sexually transmitted infections (STIs)  Get screened for STIs, including gonorrhea and chlamydia, if: ? You are sexually active and are younger than 46 years of age. ? You are older than 46 years of age and your health care provider tells you that you are at risk for this type of infection. ?  Your sexual activity has changed since you were last screened, and you are at increased risk for chlamydia or gonorrhea. Ask your health care provider if you are at risk.  Ask your health care provider about whether you are at high risk for HIV. Your health care provider may recommend a prescription medicine to help prevent HIV infection. If you choose to take medicine to prevent HIV, you should first get tested for HIV. You should then be tested every 3  months for as long as you are taking the medicine. Pregnancy  If you are about to stop having your period (premenopausal) and you may become pregnant, seek counseling before you get pregnant.  Take 400 to 800 micrograms (mcg) of folic acid every day if you become pregnant.  Ask for birth control (contraception) if you want to prevent pregnancy. Osteoporosis and menopause Osteoporosis is a disease in which the bones lose minerals and strength with aging. This can result in bone fractures. If you are 40 years old or older, or if you are at risk for osteoporosis and fractures, ask your health care provider if you should:  Be screened for bone loss.  Take a calcium or vitamin D supplement to lower your risk of fractures.  Be given hormone replacement therapy (HRT) to treat symptoms of menopause. Follow these instructions at home: Lifestyle  Do not use any products that contain nicotine or tobacco, such as cigarettes, e-cigarettes, and chewing tobacco. If you need help quitting, ask your health care provider.  Do not use street drugs.  Do not share needles.  Ask your health care provider for help if you need support or information about quitting drugs. Alcohol use  Do not drink alcohol if: ? Your health care provider tells you not to drink. ? You are pregnant, may be pregnant, or are planning to become pregnant.  If you drink alcohol: ? Limit how much you use to 0-1 drink a day. ? Limit intake if you are breastfeeding.  Be aware of how much alcohol is in your drink. In the [place] one drink equals one 12 oz bottle of beer (355 mL), one 5 oz glass of wine (148 mL), or one 1 oz glass of hard liquor (44 mL). General instructions  Schedule regular health, dental, and eye exams.  Stay current with your vaccines.  Tell your health care provider if: ? You often feel depressed. ? You have ever been abused or do not feel safe at home. Summary  Adopting a healthy lifestyle and getting  preventive care are important in promoting health and wellness.  Follow your health care provider's instructions about healthy diet, exercising, and getting tested or screened for diseases.  Follow your health care provider's instructions on monitoring your cholesterol and blood pressure. This information is not intended to replace advice given to you by your health care provider. Make sure you discuss any questions you have with your health care provider. Document Revised: 07/13/2018 Document Reviewed: 07/13/2018 Elsevier Patient Education  2021 [employer].

## 2020-08-16 NOTE — Assessment & Plan Note (Signed)
Off aimovig and rare migraine now.

## 2020-08-16 NOTE — Assessment & Plan Note (Signed)
Checking TSH and free T4 and adjust synthroid 25 mcg daily.

## 2020-08-16 NOTE — Assessment & Plan Note (Signed)
BP slightly elevated today taking amlodipine 5 mg daily

## 2020-08-19 ENCOUNTER — Other Ambulatory Visit: Payer: Self-pay | Admitting: Internal Medicine

## 2020-08-19 ENCOUNTER — Encounter: Payer: Self-pay | Admitting: Internal Medicine

## 2020-08-19 MED ORDER — SEMAGLUTIDE-WEIGHT MANAGEMENT 1 MG/0.5ML ~~LOC~~ SOAJ: 1.0000 mg | SUBCUTANEOUS | 0 refills | Status: DC

## 2020-08-19 MED ORDER — SEMAGLUTIDE-WEIGHT MANAGEMENT 0.5 MG/0.5ML ~~LOC~~ SOAJ: 0.5000 mg | SUBCUTANEOUS | 0 refills | Status: DC

## 2020-08-19 MED ORDER — SEMAGLUTIDE-WEIGHT MANAGEMENT 2.4 MG/0.75ML ~~LOC~~ SOAJ: 2.4000 mg | SUBCUTANEOUS | 3 refills | Status: DC

## 2020-08-19 MED ORDER — SEMAGLUTIDE-WEIGHT MANAGEMENT 1.7 MG/0.75ML ~~LOC~~ SOAJ: 1.7000 mg | SUBCUTANEOUS | 0 refills | Status: DC

## 2020-08-19 MED ORDER — SEMAGLUTIDE-WEIGHT MANAGEMENT 0.25 MG/0.5ML ~~LOC~~ SOAJ: 0.2500 mg | SUBCUTANEOUS | 0 refills | Status: DC

## 2020-08-25 ENCOUNTER — Ambulatory Visit (HOSPITAL_COMMUNITY)
Admission: EM | Admit: 2020-08-25 | Discharge: 2020-08-25 | Disposition: A | Discharge status: Home / Self Care | Payer: No Typology Code available for payment source | Attending: Emergency Medicine | Admitting: Emergency Medicine

## 2020-08-25 ENCOUNTER — Encounter (HOSPITAL_COMMUNITY): Payer: Self-pay

## 2020-08-25 ENCOUNTER — Other Ambulatory Visit: Payer: Self-pay

## 2020-08-25 DIAGNOSIS — Z20822 Contact with and (suspected) exposure to covid-19: Secondary | ICD-10-CM | POA: Insufficient documentation

## 2020-08-25 DIAGNOSIS — J029 Acute pharyngitis, unspecified: Secondary | ICD-10-CM | POA: Diagnosis not present

## 2020-08-25 DIAGNOSIS — J069 Acute upper respiratory infection, unspecified: Secondary | ICD-10-CM | POA: Diagnosis not present

## 2020-08-25 DIAGNOSIS — R6883 Chills (without fever): Secondary | ICD-10-CM | POA: Diagnosis not present

## 2020-08-25 DIAGNOSIS — R059 Cough, unspecified: Secondary | ICD-10-CM | POA: Diagnosis not present

## 2020-08-25 DIAGNOSIS — R52 Pain, unspecified: Secondary | ICD-10-CM | POA: Diagnosis not present

## 2020-08-25 LAB — POC INFLUENZA A AND B ANTIGEN (URGENT CARE ONLY)
Influenza A Ag: NEGATIVE
Influenza B Ag: NEGATIVE

## 2020-08-25 LAB — SARS CORONAVIRUS 2 (TAT 6-24 HRS): SARS Coronavirus 2: NEGATIVE

## 2020-08-25 MED ORDER — BENZONATATE 100 MG PO CAPS: 100.0000 mg | ORAL_CAPSULE | Freq: Three times a day (TID) | ORAL | 0 refills | Status: AC | PRN

## 2020-08-25 NOTE — ED Provider Notes (Signed)
[facility]  ____________________________________________  Time seen: Approximately 2:03 PM  I have reviewed the triage vital signs and the nursing notes.   HISTORY  Chief Complaint Cough, Sore Throat, Chills, and Generalized Body Aches   Historian Patient    HPI Maria Wiley is a 45 y.o. female presents to the urgent care with headache, pharyngitis, nasal congestion and ear pressure for the past 2 to 3 days.  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  No chest pain, chest tightness or abdominal pain.  No emesis or acute diarrhea.  No recent travel.  No other alleviating measures have been attempted.   Past Medical History:  Diagnosis Date  . Ankylosing spondylitis (HCC)   . Chest pain   . COVID-19 06/2020  . GERD (gastroesophageal reflux disease)   . Hypertension   . Migraines   . RA (rheumatoid arthritis) (HCC)   . Thyroid disease   . Urinary incontinence      Immunizations up to date:  Yes.     Past Medical History:  Diagnosis Date  . Ankylosing spondylitis (HCC)   . Chest pain   . COVID-19 06/2020  . GERD (gastroesophageal reflux disease)   . Hypertension   . Migraines   . RA (rheumatoid arthritis) (HCC)   . Thyroid disease   . Urinary incontinence     Patient Active Problem List   Diagnosis Date Noted  . Acute internal derangement of right knee 08/10/2020  . Injury of right knee 08/07/2020  . Chronic migraine without aura without status migrainosus, not intractable 05/10/2019  . Allergic rhinitis 05/08/2019  . Essential hypertension 03/10/2019  . Attention and concentration deficit 01/24/2019  . Ankylosing spondylitis (HCC) 01/15/2018  . OA (osteoarthritis) of knee 01/15/2018  . Routine general medical examination at a health care facility 01/15/2018  . IBS (irritable bowel syndrome) 01/15/2018  . RA (rheumatoid arthritis) (HCC)   . Hypothyroidism     Past Surgical History:   Procedure Laterality Date  . ABDOMINAL HYSTERECTOMY    . APPENDECTOMY    . CHOLECYSTECTOMY    . HERNIA REPAIR  2004  . KNEE SURGERY Right 2006    Prior to Admission medications   Medication Sig Start Date End Date Taking? Authorizing Provider  benzonatate (TESSALON PERLES) 100 MG capsule Take 1 capsule (100 mg total) by mouth 3 (three) times daily as needed for up to 7 days for cough. 08/25/20 09/01/20 Yes Pia Mau M, PA-C  amLODipine (NORVASC) 5 MG tablet TAKE 1 TABLET (5 MG TOTAL) BY MOUTH DAILY. 05/15/20   Myrlene Broker, MD  cholecalciferol (VITAMIN D) 1000 units tablet Take 1,000 Units by mouth daily.    [provider]  diclofenac (VOLTAREN) 75 MG EC tablet Take 75 mg by mouth 2 (two) times daily. 08/15/20   [provider]  escitalopram (LEXAPRO) 20 MG tablet TAKE 1 TABLETS BY MOUTH ONCE A DAY 07/31/20   Myrlene Broker, MD  etanercept (ENBREL SURECLICK) 50 MG/ML injection Inject 1 ml under the skin each week as directed. 08/01/20   Quentin Angst, MD  folic acid (FOLVITE) 1 MG tablet Take 1 tablet (1 mg total) by mouth daily. 01/14/18   Myrlene Broker, MD  leflunomide (ARAVA) 20 MG tablet Take 20 mg by mouth daily.    [provider]  levothyroxine (SYNTHROID) 25 MCG tablet Take 1 tablet (25 mcg total) by mouth daily before breakfast. Pt needs physical for future refills. 07/22/20  Myrlene Broker, MD  LINZESS 145 MCG CAPS capsule Take 145 mcg by mouth daily as needed.  07/25/17   [provider]  Magnesium 250 MG TABS Take 250 mg by mouth daily.    [provider]  ondansetron (ZOFRAN) 4 MG tablet Take 1 tablet (4 mg total) by mouth every 8 (eight) hours as needed for nausea or vomiting. 11/12/18   Couture, Cortni S, PA-C  ondansetron (ZOFRAN-ODT) 4 MG disintegrating tablet Take 1 tablet (4 mg total) by mouth every 8 (eight) hours as needed for nausea. Patient not taking: Reported on 08/16/2020 05/10/19    Anson Fret, MD  rizatriptan (MAXALT-MLT) 10 MG disintegrating tablet Take 1 tablet (10 mg total) by mouth as needed for migraine. May repeat in 2 hours if needed 05/10/19   Anson Fret, MD  Semaglutide-Weight Management 0.25 MG/0.5ML SOAJ Inject 0.25 mg into the skin once a week for 28 days. 08/19/20 09/16/20  Myrlene Broker, MD  Semaglutide-Weight Management 0.5 MG/0.5ML SOAJ Inject 0.5 mg into the skin once a week for 28 days. 09/17/20 10/15/20  Myrlene Broker, MD  Semaglutide-Weight Management 1 MG/0.5ML SOAJ Inject 1 mg into the skin once a week for 28 days. 10/16/20 11/13/20  Myrlene Broker, MD  Semaglutide-Weight Management 1.7 MG/0.75ML SOAJ Inject 1.7 mg into the skin once a week for 28 days. 11/14/20 12/12/20  Myrlene Broker, MD  Semaglutide-Weight Management 2.4 MG/0.75ML SOAJ Inject 2.4 mg into the skin once a week. 12/13/20   Myrlene Broker, MD  solifenacin (VESICARE) 10 MG tablet TAKE 1 TABLET (10 MG TOTAL) BY MOUTH DAILY. 05/15/20   Myrlene Broker, MD  valACYclovir (VALTREX) 1000 MG tablet Take 1 tablet (1,000 mg total) by mouth 2 (two) times daily. 02/01/20   Myrlene Broker, MD    Allergies Patient has no known allergies.  Family History  Problem Relation Age of Onset  . CAD Maternal Grandfather   . Diabetes Maternal Grandfather   . Hearing loss Maternal Grandfather   . Heart disease Maternal Grandfather   . Hyperlipidemia Maternal Grandfather   . Hypertension Mother   . Arthritis Mother   . Arthritis Maternal Grandmother   . Heart disease Maternal Grandmother   . Hyperlipidemia Maternal Grandmother   . Migraines Neg Hx   . Headache Neg Hx     Social History Social History   Tobacco Use  . Smoking status: Never Smoker  . Smokeless tobacco: Never Used  Vaping Use  . Vaping Use: Never used  Substance Use Topics  . Alcohol use: Never  . Drug use: Never      Review of Systems  Constitutional: Patient has been  afebrile.  Eyes: No visual changes. No discharge ENT: Patient has congestion.  Cardiovascular: no chest pain. Respiratory: Patient has cough.  Gastrointestinal: No abdominal pain.  No nausea, no vomiting. No diarrhea.  Genitourinary: Negative for dysuria. No hematuria Musculoskeletal: Patient has myalgias.  Skin: Negative for rash, abrasions, lacerations, ecchymosis. Neurological: Patient has headache, no focal weakness or numbness.      ____________________________________________   PHYSICAL EXAM:  VITAL SIGNS: ED Triage Vitals  Enc Vitals Group     BP 08/25/20 1336 125/80     Pulse Rate 08/25/20 1336 88     Resp 08/25/20 1336 20     Temp 08/25/20 1336 98.7 F (37.1 C)     Temp Source 08/25/20 1336 Oral     SpO2 08/25/20 1336 100 %  Weight --      Height --      Head Circumference --      Peak Flow --      Pain Score 08/25/20 1334 6     Pain Loc --      Pain Edu? --      Excl. in GC? --      Constitutional: Alert and oriented. Patient is lying supine. Eyes: Conjunctivae are normal. PERRL. EOMI. Head: Atraumatic. ENT:      Ears: Tympanic membranes are mildly injected with mild effusion bilaterally.       Nose: No congestion/rhinnorhea.      Mouth/Throat: Mucous membranes are moist. Posterior pharynx is mildly erythematous.  Hematological/Lymphatic/Immunilogical: No cervical lymphadenopathy.  Cardiovascular: Normal rate, regular rhythm. Normal S1 and S2.  Good peripheral circulation. Respiratory: Normal respiratory effort without tachypnea or retractions. Lungs CTAB. Good air entry to the bases with no decreased or absent breath sounds. Gastrointestinal: Bowel sounds 4 quadrants. Soft and nontender to palpation. No guarding or rigidity. No palpable masses. No distention. No CVA tenderness. Musculoskeletal: Full range of motion to all extremities. No gross deformities appreciated. Neurologic:  Normal speech and language. No gross focal neurologic deficits are  appreciated.  Skin:  Skin is warm, dry and intact. No rash noted. Psychiatric: Mood and affect are normal. Speech and behavior are normal. Patient exhibits appropriate insight and judgement.    ____________________________________________   LABS (all labs ordered are listed, but only abnormal results are displayed)  Labs Reviewed  SARS CORONAVIRUS 2 (TAT 6-24 HRS)   ____________________________________________  EKG   ____________________________________________  RADIOLOGY   No results found.  ____________________________________________    PROCEDURES  Procedure(s) performed:     Procedures     Medications - No data to display   ____________________________________________   INITIAL IMPRESSION / ASSESSMENT AND PLAN / ED COURSE  Pertinent labs & imaging results that were available during my care of the patient were reviewed by me and considered in my medical decision making (see chart for details).      Assessment and plan Viral URI 45 year old female presents to the emergency department with viral URI-like symptoms for the past 2 to 3 days.  Vital signs were reassuring at triage.  On physical exam, patient was alert, oriented and nontoxic-appearing.  She had no increased work of breathing and no adventitious lung sounds were auscultated.  TMs were effused bilaterally.  Discharge patient to home with Zyrtec, Flonase and Tessalon Perles for cough.  Recommended Mucinex DM with Tessalon Perles for maximum relief from cough.  Patient feels comfortable waiting COVID-19 and influenza results at home.  Rest and hydration were encouraged.  All patient questions were answered.     ____________________________________________  FINAL CLINICAL IMPRESSION(S) / ED DIAGNOSES  Final diagnoses:  Viral upper respiratory tract infection      NEW MEDICATIONS STARTED DURING THIS VISIT:  ED Discharge Orders         Ordered    benzonatate (TESSALON PERLES) 100  MG capsule  3 times daily PRN        08/25/20 1402              This chart was dictated using voice recognition software/Dragon. Despite best efforts to proofread, errors can occur which can change the meaning. Any change was purely unintentional.     Orvil Feil, PA-C 08/25/20 1421

## 2020-08-25 NOTE — ED Triage Notes (Signed)
Pt presents with headache, non productive cough, chills, sore throat, and generalized body aches since Friday evening.

## 2020-08-25 NOTE — Discharge Instructions (Signed)
Please use 1 spray of Flonase per each nostril for the next 7 days. You can take 10 mg of Zyrtec before bed. Take 2 Tessalon Perle tablets before bed with your Mucinex DM. Results for COVID and influenza should post your MyChart.

## 2020-08-25 NOTE — ED Notes (Signed)
Flu A Neg (-) Flu B Neg (-) Results not crossing over in Epic

## 2020-09-03 ENCOUNTER — Other Ambulatory Visit: Payer: Self-pay | Admitting: Internal Medicine

## 2020-09-03 ENCOUNTER — Encounter: Payer: Self-pay | Admitting: Internal Medicine

## 2020-09-03 DIAGNOSIS — E039 Hypothyroidism, unspecified: Secondary | ICD-10-CM

## 2020-09-03 MED FILL — AMLODIPINE BESYLATE 5 MG TA: 5 | 90 days supply | Qty: 90 | Fill #0

## 2020-09-03 MED FILL — SOLIFENACIN SUCCINATE 10 MG: 10 | 90 days supply | Qty: 90 | Fill #0

## 2020-09-03 MED FILL — LEVOTHYROXINE SODIUM 25 MCG: 25 | 30 days supply | Qty: 30 | Fill #0

## 2020-09-05 ENCOUNTER — Encounter: Payer: Self-pay | Admitting: Internal Medicine

## 2020-09-05 NOTE — Assessment & Plan Note (Signed)
BMI 36 but complicated by hypertension, OA, hypothyroidism. Talked with her about weight loss options and she will let us know which she prefers.

## 2020-09-08 ENCOUNTER — Other Ambulatory Visit: Payer: No Typology Code available for payment source

## 2020-09-09 MED FILL — DICLOFENAC SOD EC 75 MG TAB: 75 | 30 days supply | Qty: 60 | Fill #0

## 2020-09-10 ENCOUNTER — Telehealth: Payer: Self-pay

## 2020-09-10 NOTE — Telephone Encounter (Signed)
Prior authorization for Reginal Lutes has been initiated on covermymeds.  Key: [id number]  Once a decision has been made I will update accordingly.

## 2020-09-16 ENCOUNTER — Ambulatory Visit (HOSPITAL_BASED_OUTPATIENT_CLINIC_OR_DEPARTMENT_OTHER): Payer: No Typology Code available for payment source | Admitting: Pharmacist

## 2020-09-16 ENCOUNTER — Other Ambulatory Visit: Payer: Self-pay

## 2020-09-16 DIAGNOSIS — Z79899 Other long term (current) drug therapy: Secondary | ICD-10-CM

## 2020-09-16 MED FILL — ENBREL SURECLICK 50 MG/ML S: 50 | 28 days supply | Qty: 4 | Fill #1

## 2020-09-16 NOTE — Progress Notes (Signed)
   S: Patient presents for review of their specialty medication therapy.  Patient is currently taking Enbrel for rheumatoid arthritis. Patient is managed by Azucena Fallen for this.   Adherence: denies any missed doses  Efficacy: working well for her, has been on for a while.  Dosing: 50 mg daily  Ankylosing spondylitis, psoriatic arthritis, rheumatoid arthritis: SubQ: Note: May continue methotrexate, glucocorticoids, salicylates, NSAIDs, or analgesics during etanercept therapy. Once-weekly dosing: 50 mg once weekly; maximum dose (rheumatoid arthritis): 50 mg/week. Twice-weekly dosing (off-label dose): 25 mg twice weekly (Bathon 2000; Calin 2004; Earlene Plater 2003; Modesto Charon 2002; Mease 2000; Mease 2002)   Screening: TB test: completed per patient Hepatitis B: completed per patient  Monitoring: Injection site reactions: denies  S/sx of infections: denies S/sx of malignancy: denies GI upset: denies  O:    Lab Results  Component Value Date   WBC 7.6 08/16/2020   HGB 11.2 (L) 08/16/2020   HCT 34.9 (L) 08/16/2020   MCV 79.0 08/16/2020   PLT 243.0 08/16/2020      Chemistry      Component Value Date/Time   NA 138 08/16/2020 1521   K 3.4 (L) 08/16/2020 1521   CL 103 08/16/2020 1521   CO2 26 08/16/2020 1521   BUN 11 08/16/2020 1521   CREATININE 0.79 08/16/2020 1521      Component Value Date/Time   CALCIUM 8.9 08/16/2020 1521   ALKPHOS 59 08/16/2020 1521   AST 13 08/16/2020 1521   ALT 7 08/16/2020 1521   BILITOT 0.3 08/16/2020 1521       A/P: 1. Medication review: patient currently on Enbrel for rheumatoid arthritis and is tolerating it well. Reviewed the medication with the patient, including the following: Enbrel (etanercept) binds tumor necrosis factor (TNF) and blocks its interaction with cell surface receptors. TNF plays an important role in the inflammatory processes of many diseases. Patient educated on purpose, proper use and potential adverse effects of Enbrel.  Adverse effects include rash, GI upset, increased risk of infection, and injection site reactions. Patients should stop Enbrel if they develop a serious infection. There is a possible increased risk in lymphoma and other malignancies. No recommendations for any changes.   Butch Penny, PharmD, P[place]y [place], CPP Clinical Pharmacist [facility] [phone]

## 2020-09-19 NOTE — Telephone Encounter (Signed)
The request has been approved. The authorization is effective for a maximum of 6 fills from 09/12/2020 to 03/11/2021, as long as the member is enrolled in their current health plan. The request was reviewed and approved by a licensed clinical pharmacist. This request is approved for 59mL (4 of the 0.25mg /0.26mL pens) per 28 days. Please note additional prior authorizations (PA) have been entered from 09/12/2020 through 03/11/2021 for the following: Wegovy 0.5mg / 0.92mL for a maximum of 6 fills with a quantity limit of qty 59mL per 28 days ([place] [id number]). Wegovy 1mg / 0.48mL for a maximum of 6 fills with a quantity limit of qty 52mL per 28 days ([place] 3722). Wegovy 1.7mg / 0.30mL for a maximum of 6 fills with a quantity limit of qty 77mL per 28 days (PA 3723). Wegovy 2.4mg / 0.33mL for a maximum of 6 fills with a quantity limit of qty 65mL per 28 days (PA 3724).Renewal for [facility] requires that you have achieved or maintained at least 5% weight loss from baseline. A written notification letter will follow with additional details.

## 2020-09-20 MED FILL — WEGOVY 0.25 MG/0.5ML SOAJ: 0.25 | 28 days supply | Qty: 2 | Fill #0

## 2020-09-25 ENCOUNTER — Other Ambulatory Visit: Payer: Self-pay

## 2020-09-25 ENCOUNTER — Ambulatory Visit
Admission: RE | Admit: 2020-09-25 | Discharge: 2020-09-25 | Disposition: A | Discharge status: Home / Self Care | Payer: No Typology Code available for payment source | Source: Ambulatory Visit | Attending: Internal Medicine | Admitting: Internal Medicine

## 2020-09-25 ENCOUNTER — Other Ambulatory Visit: Payer: Self-pay | Admitting: Internal Medicine

## 2020-09-25 DIAGNOSIS — M2391 Unspecified internal derangement of right knee: Secondary | ICD-10-CM

## 2020-09-25 DIAGNOSIS — S83241A Other tear of medial meniscus, current injury, right knee, initial encounter: Secondary | ICD-10-CM | POA: Insufficient documentation

## 2020-09-25 DIAGNOSIS — S83241D Other tear of medial meniscus, current injury, right knee, subsequent encounter: Secondary | ICD-10-CM

## 2020-09-25 DIAGNOSIS — S8991XA Unspecified injury of right lower leg, initial encounter: Secondary | ICD-10-CM

## 2020-10-10 ENCOUNTER — Other Ambulatory Visit: Payer: Self-pay | Admitting: Pharmacist

## 2020-10-10 ENCOUNTER — Ambulatory Visit (INDEPENDENT_AMBULATORY_CARE_PROVIDER_SITE_OTHER): Payer: No Typology Code available for payment source | Admitting: Orthopedic Surgery

## 2020-10-10 ENCOUNTER — Encounter: Payer: Self-pay | Admitting: Orthopedic Surgery

## 2020-10-10 VITALS — Ht 67.0 in | Wt 235.0 lb

## 2020-10-10 DIAGNOSIS — S83221A Peripheral tear of medial meniscus, current injury, right knee, initial encounter: Secondary | ICD-10-CM

## 2020-10-10 MED ORDER — ENBREL SURECLICK 50 MG/ML ~~LOC~~ SOAJ
SUBCUTANEOUS | 1 refills | Status: DC
Start: 2020-10-10 — End: 2020-10-10

## 2020-10-10 NOTE — Progress Notes (Signed)
Office Visit Note   Patient: Maria Wiley           Date of Birth: January 25, 1976           MRN: [id number] Visit Date: 10/10/2020 Requested by: Etta Grandchild, MD [address] [place],  [place] [address] PCP: Myrlene Broker, MD  Subjective: Chief Complaint  Patient presents with  . Right Knee - Pain    HPI: Maria Wiley is a 45 year old patient with right knee pain several months duration.  She states currently the pain in her knee is not as bad as it was.  She has had an MRI scan which shows large radial tear of the posterior horn medial meniscus with meniscal extrusion along with mild osteoarthritis medially and laterally.  Currently Maria Wiley does not report any significant pain in the knee.  When it was at its worst she had difficulty walking after she woke up from sleep.  Initially she had some swelling.  Stairs are okay for her.  She likes to walk in the neighborhood.  Takes Voltaren twice a day along with Enbrel for known history of rheumatoid arthritis.  She has a history of arthroscopy on that right knee in 2002 for lateral release but she denies that any meniscal work was done at that time.              ROS: All systems reviewed are negative as they relate to the chief complaint within the history of present illness.  Patient denies  fevers or chills.   Assessment & Plan: Visit Diagnoses:  1. Peripheral tear of medial meniscus of right knee as current injury, initial encounter     Plan: Impression is right knee radial meniscal tear near the root.  This is not a classic meniscal root tear however.  She is currently not that symptomatic but does have effusions in both knees.  Slightly increased body mass index puts her at risk along with diminished meniscal function of developing arthritis.  Decision making today really is about preventing or delaying knee replacement in this patient.  In general since she is not too symptomatic I do not think that we have to accelerate  decision-making about surgery.  Nonetheless if it is possible I think anchoring that meniscus near the posterior horn with meniscal root repair type procedure could be beneficial in achieving that goal.  Without meniscal function it is likely that within 5 to 6 years her knee would deteriorate to the point where she would need either partial or complete knee replacement.  She is going to consider her options.  Although she is not currently symptomatic I think if it is technically possible a meniscal repair is indicated to preserve her native knee function as long as possible.  We are going to meet again and talk about it in 4 weeks.  Follow-Up Instructions: Return in about 4 weeks (around 11/07/2020).   Orders:  No orders of the defined types were placed in this encounter.  No orders of the defined types were placed in this encounter.     Procedures: No procedures performed   Clinical Data: No additional findings.  Objective: Vital Signs: Ht 5\' 7"  (1.702 m)   Wt 235 lb (106.6 kg)   BMI 36.81 kg/m   Physical Exam:   Constitutional: Patient appears well-developed HEENT:  Head: Normocephalic Eyes:EOM are normal Neck: Normal range of motion Cardiovascular: Normal rate Pulmonary/chest: Effort normal Neurologic: Patient is alert Skin: Skin is warm Psychiatric: Patient has  normal mood and affect    Ortho Exam: Ortho exam demonstrates mild effusion in both knees.  Collateral crucial ligaments are stable.  Extensor mechanism is intact bilaterally.  Range of motion is full.  She also describes some pain in bilateral bilateral shoulders and hands.  Has slightly more medial than lateral joint line tenderness in the right knee.  Specialty Comments:  No specialty comments available.  Imaging: No results found.   PMFS History: Patient Active Problem List   Diagnosis Date Noted  . Acute medial meniscus tear of right knee 09/25/2020  . Morbid obesity (HCC) 09/05/2020  . Acute  internal derangement of right knee 08/10/2020  . Injury of right knee 08/07/2020  . Chronic migraine without aura without status migrainosus, not intractable 05/10/2019  . Allergic rhinitis 05/08/2019  . Essential hypertension 03/10/2019  . Attention and concentration deficit 01/24/2019  . Ankylosing spondylitis (HCC) 01/15/2018  . OA (osteoarthritis) of knee 01/15/2018  . Routine general medical examination at a health care facility 01/15/2018  . IBS (irritable bowel syndrome) 01/15/2018  . RA (rheumatoid arthritis) (HCC)   . Hypothyroidism    Past Medical History:  Diagnosis Date  . Ankylosing spondylitis (HCC)   . Chest pain   . COVID-19 06/2020  . GERD (gastroesophageal reflux disease)   . Hypertension   . Migraines   . RA (rheumatoid arthritis) (HCC)   . Thyroid disease   . Urinary incontinence     Family History  Problem Relation Age of Onset  . CAD Maternal Grandfather   . Diabetes Maternal Grandfather   . Hearing loss Maternal Grandfather   . Heart disease Maternal Grandfather   . Hyperlipidemia Maternal Grandfather   . Hypertension Mother   . Arthritis Mother   . Arthritis Maternal Grandmother   . Heart disease Maternal Grandmother   . Hyperlipidemia Maternal Grandmother   . Migraines Neg Hx   . Headache Neg Hx     Past Surgical History:  Procedure Laterality Date  . ABDOMINAL HYSTERECTOMY    . APPENDECTOMY    . CHOLECYSTECTOMY    . HERNIA REPAIR  2004  . KNEE SURGERY Right 2006   Social History   Occupational History  . Not on file  Tobacco Use  . Smoking status: Never Smoker  . Smokeless tobacco: Never Used  Vaping Use  . Vaping Use: Never used  Substance and Sexual Activity  . Alcohol use: Never  . Drug use: Never  . Sexual activity: Yes    Birth control/protection: Surgical

## 2020-10-14 ENCOUNTER — Other Ambulatory Visit: Payer: Self-pay | Admitting: Internal Medicine

## 2020-10-14 DIAGNOSIS — E039 Hypothyroidism, unspecified: Secondary | ICD-10-CM

## 2020-10-21 MED FILL — ENBREL SURECLICK 50 MG/ML S: 50 | 28 days supply | Qty: 4 | Fill #0

## 2020-10-28 ENCOUNTER — Other Ambulatory Visit: Payer: Self-pay

## 2020-10-28 ENCOUNTER — Ambulatory Visit
Admission: EM | Admit: 2020-10-28 | Discharge: 2020-10-28 | Discharge status: Absent without leave | Payer: No Typology Code available for payment source

## 2020-10-29 ENCOUNTER — Other Ambulatory Visit (HOSPITAL_COMMUNITY): Payer: Self-pay

## 2020-11-13 ENCOUNTER — Ambulatory Visit (INDEPENDENT_AMBULATORY_CARE_PROVIDER_SITE_OTHER): Payer: No Typology Code available for payment source | Admitting: Orthopedic Surgery

## 2020-11-13 DIAGNOSIS — S83221A Peripheral tear of medial meniscus, current injury, right knee, initial encounter: Secondary | ICD-10-CM | POA: Diagnosis not present

## 2020-11-14 ENCOUNTER — Other Ambulatory Visit (HOSPITAL_COMMUNITY): Payer: Self-pay

## 2020-11-15 ENCOUNTER — Other Ambulatory Visit (HOSPITAL_COMMUNITY): Payer: Self-pay

## 2020-11-15 ENCOUNTER — Encounter: Payer: Self-pay | Admitting: Orthopedic Surgery

## 2020-11-15 MED ORDER — LIDOCAINE HCL 1 % IJ SOLN
5.0000 mL | INTRAMUSCULAR | Status: AC | PRN
  Administered 2020-11-13: 5 mL

## 2020-11-15 MED ORDER — BUPIVACAINE HCL 0.25 % IJ SOLN
4.0000 mL | INTRAMUSCULAR | Status: AC | PRN
  Administered 2020-11-13: 4 mL via INTRA_ARTICULAR

## 2020-11-15 MED FILL — Diclofenac Sodium Tab Delayed Release 75 MG: ORAL | 30 days supply | Qty: 60 | Fill #0 | Status: AC

## 2020-11-15 MED FILL — Semaglutide (Weight Mngmt) Soln Auto-Injector 1 MG/0.5ML: SUBCUTANEOUS | 28 days supply | Qty: 2 | Fill #0 | Status: CN

## 2020-11-15 MED FILL — Semaglutide (Weight Mngmt) Soln Auto-Injector 1 MG/0.5ML: SUBCUTANEOUS | 28 days supply | Qty: 2 | Fill #0 | Status: AC

## 2020-11-15 NOTE — Progress Notes (Signed)
Office Visit Note   Patient: Maria Wiley           Date of Birth: 11-16-1975           MRN: [id number] Visit Date: 11/13/2020 Requested by: Maria Broker, MD [address] [place],  [place] [address] PCP: Maria Broker, MD  Subjective: Chief Complaint  Patient presents with  . Knee Pain    HPI: Maria Wiley is a 45 year old patient with right knee pain.  She has meniscal root evulsion on MRI scan.  Reports worsening pain and swelling.  For the past 3 weeks the pain has been "pretty bad".  Hurts her to walk and she also reports stiffness.  Interestingly she has lateral greater than medial pain.  Stairs hurt her going up and down stairs.  She has had an injection but its been several years ago.  Takes Voltaren for rheumatoid arthritis.  She has been on Enbrel since 2017.  She is a mother of 4 and there is a full slate of summer activities planned at this time.  No family or personal history of DVT or pulmonary embolism              ROS: All systems reviewed are negative as they relate to the chief complaint within the history of present illness.  Patient denies  fevers or chills.   Assessment & Plan: Visit Diagnoses:  1. Peripheral tear of medial meniscus of right knee as current injury, initial encounter     Plan: Impression is right knee pain with meniscal root avulsion on the medial side.  Effusion is present today.  Discussed with her that at this time she has really no functional cushion in the medial compartment of her knee.  Left untreated this will progress to arthritis likely within 3 to 5 years.  I think there is a chance that meniscal root repair could delay that progression.  Nonetheless in a patient with rheumatoid arthritis on immunosuppressants her immune and inflammatory response will be blunted which may make healing slightly more difficult.  I think for cam surgery to give her a chance that delaying the need for partial versus complete knee replacement  in the future.  Does not have to be done immediately but it is likely that her symptoms will continue until she does have an intervention.  Would like to get that done before too much arthritis sets into the knee.  She will proceed with her summer activities and then we will meet again in September to discuss intervention.  Injection performed today for temporary pain relief.  Follow-Up Instructions: No follow-ups on file.   Orders:  No orders of the defined types were placed in this encounter.  No orders of the defined types were placed in this encounter.     Procedures: Large Joint Inj: R knee on 11/13/2020 1:30 PM Indications: diagnostic evaluation, joint swelling and pain Details: 18 G 1.5 in needle, superolateral approach  Arthrogram: No  Medications: 5 mL lidocaine 1 %; 4 mL bupivacaine 0.25 % Outcome: tolerated well, no immediate complications Procedure, treatment alternatives, risks and benefits explained, specific risks discussed. Consent was given by the patient. Immediately prior to procedure a time out was called to verify the correct patient, procedure, equipment, support staff and site/side marked as required. Patient was prepped and draped in the usual sterile fashion.       Clinical Data: No additional findings.  Objective: Vital Signs: There were no vitals taken for this visit.  Physical Exam:   Constitutional: Patient appears well-developed HEENT:  Head: Normocephalic Eyes:EOM are normal Neck: Normal range of motion Cardiovascular: Normal rate Pulmonary/chest: Effort normal Neurologic: Patient is alert Skin: Skin is warm Psychiatric: Patient has normal mood and affect    Ortho Exam: Ortho exam demonstrates full active and passive range of motion of the right knee but moderate effusion is present.  Collateral crucial ligaments are stable.  She has both lateral and medial joint line tenderness.  MRI scan did not show any lateral compartment pathology.   Pedal pulses palpable.  No groin pain with internal ex rotation of the leg.  Specialty Comments:  No specialty comments available.  Imaging: No results found.   PMFS History: Patient Active Problem List   Diagnosis Date Noted  . Acute medial meniscus tear of right knee 09/25/2020  . Morbid obesity (HCC) 09/05/2020  . Acute internal derangement of right knee 08/10/2020  . Injury of right knee 08/07/2020  . Chronic migraine without aura without status migrainosus, not intractable 05/10/2019  . Allergic rhinitis 05/08/2019  . Essential hypertension 03/10/2019  . Attention and concentration deficit 01/24/2019  . Ankylosing spondylitis (HCC) 01/15/2018  . OA (osteoarthritis) of knee 01/15/2018  . Routine general medical examination at a health care facility 01/15/2018  . IBS (irritable bowel syndrome) 01/15/2018  . RA (rheumatoid arthritis) (HCC)   . Hypothyroidism    Past Medical History:  Diagnosis Date  . Ankylosing spondylitis (HCC)   . Chest pain   . COVID-19 06/2020  . GERD (gastroesophageal reflux disease)   . Hypertension   . Migraines   . RA (rheumatoid arthritis) (HCC)   . Thyroid disease   . Urinary incontinence     Family History  Problem Relation Age of Onset  . CAD Maternal Grandfather   . Diabetes Maternal Grandfather   . Hearing loss Maternal Grandfather   . Heart disease Maternal Grandfather   . Hyperlipidemia Maternal Grandfather   . Hypertension Mother   . Arthritis Mother   . Arthritis Maternal Grandmother   . Heart disease Maternal Grandmother   . Hyperlipidemia Maternal Grandmother   . Migraines Neg Hx   . Headache Neg Hx     Past Surgical History:  Procedure Laterality Date  . ABDOMINAL HYSTERECTOMY    . APPENDECTOMY    . CHOLECYSTECTOMY    . HERNIA REPAIR  2004  . KNEE SURGERY Right 2006   Social History   Occupational History  . Not on file  Tobacco Use  . Smoking status: Never Smoker  . Smokeless tobacco: Never Used  Vaping Use   . Vaping Use: Never used  Substance and Sexual Activity  . Alcohol use: Never  . Drug use: Never  . Sexual activity: Yes    Birth control/protection: Surgical

## 2020-11-18 ENCOUNTER — Other Ambulatory Visit (HOSPITAL_COMMUNITY): Payer: Self-pay

## 2020-11-19 ENCOUNTER — Other Ambulatory Visit (HOSPITAL_COMMUNITY): Payer: Self-pay

## 2020-11-19 MED FILL — Etanercept Subcutaneous Solution Auto-injector 50 MG/ML: SUBCUTANEOUS | 28 days supply | Qty: 4 | Fill #0 | Status: AC

## 2020-12-02 ENCOUNTER — Other Ambulatory Visit (HOSPITAL_COMMUNITY): Payer: Self-pay

## 2020-12-03 ENCOUNTER — Other Ambulatory Visit: Payer: Self-pay

## 2020-12-09 ENCOUNTER — Other Ambulatory Visit: Payer: Self-pay | Admitting: Physician Assistant

## 2020-12-09 ENCOUNTER — Other Ambulatory Visit (HOSPITAL_COMMUNITY): Payer: Self-pay

## 2020-12-09 ENCOUNTER — Other Ambulatory Visit: Payer: Self-pay | Admitting: Internal Medicine

## 2020-12-09 MED ORDER — SOLIFENACIN SUCCINATE 10 MG PO TABS
10.0000 mg | ORAL_TABLET | Freq: Every day | ORAL | 0 refills | Status: DC
  Filled 2020-12-09: qty 90, 90d supply, fill #0

## 2020-12-09 MED ORDER — PREDNISONE 5 MG PO TABS
5.0000 mg | ORAL_TABLET | Freq: Every day | ORAL | 0 refills | Status: DC
  Filled 2020-12-09: qty 48, 12d supply, fill #0

## 2020-12-09 MED FILL — Semaglutide (Weight Mngmt) Soln Auto-Injector 1.7 MG/0.75ML: SUBCUTANEOUS | 28 days supply | Qty: 3 | Fill #0 | Status: AC

## 2020-12-10 ENCOUNTER — Other Ambulatory Visit (HOSPITAL_COMMUNITY): Payer: Self-pay

## 2020-12-10 ENCOUNTER — Other Ambulatory Visit: Payer: Self-pay | Admitting: Internal Medicine

## 2020-12-10 NOTE — Telephone Encounter (Signed)
No, her rheumatologist prescribes this not [place].

## 2020-12-11 ENCOUNTER — Other Ambulatory Visit: Payer: Self-pay | Admitting: Physician Assistant

## 2020-12-11 ENCOUNTER — Other Ambulatory Visit: Payer: Self-pay | Admitting: Internal Medicine

## 2020-12-11 ENCOUNTER — Other Ambulatory Visit (HOSPITAL_COMMUNITY): Payer: Self-pay

## 2020-12-11 MED ORDER — AMLODIPINE BESYLATE 5 MG PO TABS
5.0000 mg | ORAL_TABLET | Freq: Every day | ORAL | 0 refills | Status: DC
  Filled 2020-12-11: qty 90, 90d supply, fill #0

## 2020-12-12 ENCOUNTER — Other Ambulatory Visit: Payer: Self-pay | Admitting: Internal Medicine

## 2020-12-12 ENCOUNTER — Other Ambulatory Visit (HOSPITAL_COMMUNITY): Payer: Self-pay

## 2020-12-12 MED ORDER — DICLOFENAC SODIUM 75 MG PO TBEC
75.0000 mg | DELAYED_RELEASE_TABLET | Freq: Two times a day (BID) | ORAL | 2 refills | Status: DC
  Filled 2020-12-12: qty 60, 30d supply, fill #0
  Filled 2021-01-10: qty 60, 30d supply, fill #1
  Filled 2021-03-05: qty 60, 30d supply, fill #2

## 2020-12-13 ENCOUNTER — Other Ambulatory Visit (HOSPITAL_COMMUNITY): Payer: Self-pay

## 2020-12-13 ENCOUNTER — Other Ambulatory Visit: Payer: Self-pay | Admitting: Internal Medicine

## 2020-12-14 ENCOUNTER — Other Ambulatory Visit (HOSPITAL_COMMUNITY): Payer: Self-pay

## 2020-12-17 ENCOUNTER — Other Ambulatory Visit (HOSPITAL_COMMUNITY): Payer: Self-pay

## 2020-12-17 ENCOUNTER — Ambulatory Visit
Admission: EM | Admit: 2020-12-17 | Discharge: 2020-12-17 | Disposition: A | Discharge status: Home / Self Care | Payer: No Typology Code available for payment source | Attending: Emergency Medicine | Admitting: Emergency Medicine

## 2020-12-17 ENCOUNTER — Other Ambulatory Visit: Payer: Self-pay

## 2020-12-17 DIAGNOSIS — Z20822 Contact with and (suspected) exposure to covid-19: Secondary | ICD-10-CM | POA: Diagnosis not present

## 2020-12-17 NOTE — ED Triage Notes (Signed)
Patient here for COVID test only, no symptoms.  

## 2020-12-18 LAB — SARS CORONAVIRUS 2 (TAT 6-24 HRS): SARS Coronavirus 2: NEGATIVE

## 2020-12-21 ENCOUNTER — Other Ambulatory Visit: Payer: Self-pay | Admitting: Internal Medicine

## 2020-12-21 ENCOUNTER — Other Ambulatory Visit (HOSPITAL_COMMUNITY): Payer: Self-pay

## 2020-12-25 ENCOUNTER — Other Ambulatory Visit: Payer: Self-pay | Admitting: Internal Medicine

## 2020-12-25 ENCOUNTER — Telehealth: Payer: No Typology Code available for payment source | Admitting: Physician Assistant

## 2020-12-25 ENCOUNTER — Encounter: Payer: Self-pay | Admitting: Internal Medicine

## 2020-12-25 ENCOUNTER — Other Ambulatory Visit (HOSPITAL_COMMUNITY): Payer: Self-pay

## 2020-12-25 DIAGNOSIS — H1031 Unspecified acute conjunctivitis, right eye: Secondary | ICD-10-CM

## 2020-12-25 MED ORDER — POLYMYXIN B-TRIMETHOPRIM 10000-0.1 UNIT/ML-% OP SOLN
1.0000 [drp] | Freq: Four times a day (QID) | OPHTHALMIC | 0 refills | Status: AC
  Filled 2020-12-25: qty 10, 13d supply, fill #0

## 2020-12-25 NOTE — Progress Notes (Signed)
I have spent 5 minutes in review of e-visit questionnaire, review and updating patient chart, medical decision making and response to patient.   Nevea Spiewak Cody Amazin Pincock, PA-C    

## 2020-12-25 NOTE — Progress Notes (Signed)
E-Visit for Pink Eye   We are sorry that you are not feeling well.  Here is how we plan to help!  Based on what you have shared with me it looks like you have conjunctivitis.  Conjunctivitis is a common inflammatory or infectious condition of the eye that is often referred to as "pink eye".  In most cases it is contagious (viral or bacterial). However, not all conjunctivitis requires antibiotics (ex. Allergic).  We have made appropriate suggestions for you based upon your presentation.  I have prescribed Polytrim Ophthalmic drops 1-2 drops 4 times a day times 5 days  Pink eye can be highly contagious.  It is typically spread through direct contact with secretions, or contaminated objects or surfaces that one may have touched.  Strict handwashing is suggested with soap and water is urged.  If not available, use alcohol based had sanitizer.  Avoid unnecessary touching of the eye.  If you wear contact lenses, you will need to refrain from wearing them until you see no white discharge from the eye for at least 24 hours after being on medication.  You should see symptom improvement in 1-2 days after starting the medication regimen.  Call us if symptoms are not improved in 1-2 days.  Home Care: Wash your hands often! Do not wear your contacts until you complete your treatment plan. Avoid sharing towels, bed linen, personal items with a person who has pink eye. See attention for anyone in your home with similar symptoms.  Get Help Right Away If: Your symptoms do not improve. You develop blurred or loss of vision. Your symptoms worsen (increased discharge, pain or redness)  Your e-visit answers were reviewed by a board certified advanced clinical practitioner to complete your personal care plan.  Depending on the condition, your plan could have included both over the counter or prescription medications.  If there is a problem please reply  once you have received a response from your provider.  Your  safety is important to us.  If you have drug allergies check your prescription carefully.    You can use MyChart to ask questions about today's visit, request a non-urgent call back, or ask for a work or school excuse for 24 hours related to this e-Visit. If it has been greater than 24 hours you will need to follow up with your provider, or enter a new e-Visit to address those concerns.   You will get an e-mail in the next two days asking about your experience.  I hope that your e-visit has been valuable and will speed your recovery. Thank you for using e-visits.     

## 2020-12-26 ENCOUNTER — Other Ambulatory Visit (HOSPITAL_COMMUNITY): Payer: Self-pay

## 2020-12-26 MED FILL — Leflunomide Tab 20 MG: ORAL | 30 days supply | Qty: 30 | Fill #0 | Status: CN

## 2020-12-27 ENCOUNTER — Other Ambulatory Visit (HOSPITAL_COMMUNITY): Payer: Self-pay

## 2020-12-31 ENCOUNTER — Other Ambulatory Visit (HOSPITAL_COMMUNITY): Payer: Self-pay

## 2020-12-31 ENCOUNTER — Other Ambulatory Visit: Payer: Self-pay | Admitting: Pharmacist

## 2020-12-31 MED ORDER — ENBREL SURECLICK 50 MG/ML ~~LOC~~ SOAJ: SUBCUTANEOUS | 1 refills | Status: DC

## 2020-12-31 MED ORDER — ENBREL SURECLICK 50 MG/ML ~~LOC~~ SOAJ
SUBCUTANEOUS | 1 refills | Status: DC
  Filled 2020-12-31: qty 4, 28d supply, fill #0
  Filled 2021-02-17: qty 4, 28d supply, fill #1

## 2021-01-03 ENCOUNTER — Other Ambulatory Visit (HOSPITAL_COMMUNITY): Payer: Self-pay

## 2021-01-10 ENCOUNTER — Other Ambulatory Visit (HOSPITAL_COMMUNITY): Payer: Self-pay

## 2021-01-10 ENCOUNTER — Other Ambulatory Visit: Payer: Self-pay | Admitting: Internal Medicine

## 2021-01-10 DIAGNOSIS — E039 Hypothyroidism, unspecified: Secondary | ICD-10-CM

## 2021-01-10 MED FILL — Semaglutide (Weight Mngmt) Soln Auto-Injector 2.4 MG/0.75ML: SUBCUTANEOUS | 28 days supply | Qty: 3 | Fill #0 | Status: AC

## 2021-01-13 ENCOUNTER — Other Ambulatory Visit (HOSPITAL_COMMUNITY): Payer: Self-pay

## 2021-01-13 MED ORDER — LEVOTHYROXINE SODIUM 25 MCG PO TABS
25.0000 ug | ORAL_TABLET | Freq: Every day | ORAL | 2 refills | Status: DC
  Filled 2021-01-13: qty 90, 90d supply, fill #0
  Filled 2021-04-17: qty 90, 90d supply, fill #1
  Filled 2021-05-06 – 2021-08-07 (×2): qty 90, 90d supply, fill #2

## 2021-01-15 ENCOUNTER — Other Ambulatory Visit (HOSPITAL_COMMUNITY): Payer: Self-pay

## 2021-01-15 MED ORDER — CARESTART COVID-19 HOME TEST VI KIT
PACK | 0 refills | Status: DC
  Filled 2021-01-15: qty 4, 4d supply, fill #0

## 2021-01-24 ENCOUNTER — Other Ambulatory Visit (HOSPITAL_COMMUNITY): Payer: Self-pay

## 2021-01-31 ENCOUNTER — Other Ambulatory Visit (HOSPITAL_COMMUNITY): Payer: Self-pay

## 2021-01-31 ENCOUNTER — Ambulatory Visit: Payer: No Typology Code available for payment source | Admitting: Internal Medicine

## 2021-02-07 ENCOUNTER — Ambulatory Visit (INDEPENDENT_AMBULATORY_CARE_PROVIDER_SITE_OTHER): Payer: No Typology Code available for payment source

## 2021-02-07 ENCOUNTER — Ambulatory Visit (INDEPENDENT_AMBULATORY_CARE_PROVIDER_SITE_OTHER): Payer: No Typology Code available for payment source | Admitting: Internal Medicine

## 2021-02-07 ENCOUNTER — Encounter: Payer: Self-pay | Admitting: Internal Medicine

## 2021-02-07 VITALS — BP 126/74 | HR 86 | Temp 98.7°F | Resp 18 | Ht 67.0 in | Wt 214.2 lb

## 2021-02-07 DIAGNOSIS — M79672 Pain in left foot: Secondary | ICD-10-CM | POA: Insufficient documentation

## 2021-02-07 DIAGNOSIS — R200 Anesthesia of skin: Secondary | ICD-10-CM | POA: Insufficient documentation

## 2021-02-07 DIAGNOSIS — R202 Paresthesia of skin: Secondary | ICD-10-CM | POA: Diagnosis not present

## 2021-02-07 DIAGNOSIS — J3089 Other allergic rhinitis: Secondary | ICD-10-CM

## 2021-02-07 NOTE — Progress Notes (Signed)
   Subjective:   Patient ID: Maria Wiley, female    DOB: 1976/02/05, 45 y.o.   MRN: [id number]  HPI The patient is a 45 YO female coming in for concerns about nasal drainage and odor (going on for years, overall stable, seasonally some worse, denies fevers or chills or throat pain, denies cough or SOB, would like to see ENT, has tried all allergy medications otc without results and nasal steroids), and tingling in arms and legs (since losing about 20 pounds and increasing dose of wegovy, only at night time while lying in bed, no other medication changes), and left 5th toe pain (injury about 5 days ago with resultant pain and bruising, walking okay but sore and swollen).   Review of Systems  Constitutional: Negative.   HENT:  Positive for postnasal drip.   Eyes: Negative.   Respiratory:  Negative for cough, chest tightness and shortness of breath.   Cardiovascular:  Negative for chest pain, palpitations and leg swelling.  Gastrointestinal:  Negative for abdominal distention, abdominal pain, constipation, diarrhea, nausea and vomiting.  Musculoskeletal:  Positive for arthralgias, joint swelling and myalgias.  Skin: Negative.   Neurological:  Positive for numbness.  Psychiatric/Behavioral: Negative.     Objective:  Physical Exam Constitutional:      Appearance: She is well-developed.  HENT:     Head: Normocephalic and atraumatic.     Mouth/Throat:     Pharynx: Posterior oropharyngeal erythema present. No oropharyngeal exudate.  Cardiovascular:     Rate and Rhythm: Normal rate and regular rhythm.  Pulmonary:     Effort: Pulmonary effort is normal. No respiratory distress.     Breath sounds: Normal breath sounds. No wheezing or rales.  Abdominal:     General: Bowel sounds are normal. There is no distension.     Palpations: Abdomen is soft.     Tenderness: There is no abdominal tenderness. There is no rebound.  Musculoskeletal:        General: Tenderness present.      Cervical back: Normal range of motion.     Comments: Left 5th toe with pain and swelling  Skin:    General: Skin is warm and dry.  Neurological:     Mental Status: She is alert and oriented to person, place, and time.     Coordination: Coordination normal.    Vitals:   02/07/21 1557  BP: 126/74  Pulse: 86  Resp: 18  Temp: 98.7 F (37.1 C)  TempSrc: Oral  SpO2: 99%  Weight: 214 lb 3.2 oz (97.2 kg)  Height: 5\' 7"  (1.702 m)    This visit occurred during the SARS-CoV-2 public health emergency.  Safety protocols were in place, including screening questions prior to the visit, additional usage of staff PPE, and extensive cleaning of exam room while observing appropriate contact time as indicated for disinfecting solutions.   Assessment & Plan:

## 2021-02-07 NOTE — Assessment & Plan Note (Signed)
Taking otc and nasal corticosteroids without relief and wants to see ENT for evaluation of physical drainage issue.

## 2021-02-07 NOTE — Assessment & Plan Note (Signed)
Could be related to recent 20 pound weight loss. Vitamin D recently close to normal. Checking B12 and TSH to assess. If labs normal will monitor and see if this resolves. Could be related to increase in dose of wegovy although this is not typical side effect.

## 2021-02-07 NOTE — Assessment & Plan Note (Signed)
Ordered x-ray left foot and instruction on care.

## 2021-02-07 NOTE — Patient Instructions (Signed)
We will check the labs and x-ray and get you in with ENT.  The tingling sensation may be coming from the weight loss and you may just adjust and this will go away.

## 2021-02-08 LAB — TSH: TSH: 0.82 mIU/L

## 2021-02-08 LAB — VITAMIN B12: Vitamin B-12: 305 pg/mL (ref 200–1100)

## 2021-02-10 ENCOUNTER — Encounter: Payer: Self-pay | Admitting: Internal Medicine

## 2021-02-10 DIAGNOSIS — S92352A Displaced fracture of fifth metatarsal bone, left foot, initial encounter for closed fracture: Secondary | ICD-10-CM

## 2021-02-12 ENCOUNTER — Other Ambulatory Visit: Payer: Self-pay | Admitting: Internal Medicine

## 2021-02-12 ENCOUNTER — Other Ambulatory Visit (HOSPITAL_COMMUNITY): Payer: Self-pay

## 2021-02-12 MED FILL — Semaglutide (Weight Mngmt) Soln Auto-Injector 2.4 MG/0.75ML: SUBCUTANEOUS | 28 days supply | Qty: 3 | Fill #1 | Status: CN

## 2021-02-12 MED FILL — Escitalopram Oxalate Tab 20 MG (Base Equiv): ORAL | 90 days supply | Qty: 90 | Fill #0 | Status: AC

## 2021-02-12 MED FILL — Semaglutide (Weight Mngmt) Soln Auto-Injector 2.4 MG/0.75ML: SUBCUTANEOUS | 28 days supply | Qty: 3 | Fill #1 | Status: AC

## 2021-02-17 ENCOUNTER — Other Ambulatory Visit (HOSPITAL_COMMUNITY): Payer: Self-pay

## 2021-02-19 ENCOUNTER — Other Ambulatory Visit: Payer: Self-pay

## 2021-02-19 ENCOUNTER — Other Ambulatory Visit: Payer: Self-pay | Admitting: Podiatry

## 2021-02-19 ENCOUNTER — Ambulatory Visit (INDEPENDENT_AMBULATORY_CARE_PROVIDER_SITE_OTHER): Payer: No Typology Code available for payment source | Admitting: Podiatry

## 2021-02-19 DIAGNOSIS — S92502A Displaced unspecified fracture of left lesser toe(s), initial encounter for closed fracture: Secondary | ICD-10-CM

## 2021-02-25 ENCOUNTER — Encounter: Payer: Self-pay | Admitting: Podiatry

## 2021-02-25 NOTE — Progress Notes (Signed)
Subjective:  Patient ID: Maria Wiley, female    DOB: 1976/04/13,  MRN: [id number]  Chief Complaint  Patient presents with   Toe Injury    Left 5th toe fracture 16 days ago. Pt states it has been gradually improving although it is still sore. Pt has been applying a buddy splint to the toe.    45 y.o. female presents with the above complaint.  Patient presents with complaint of left fifth digit fracture.  Patient states it happened about 16 days ago.  Is gradual increase still sore.  Patient did buddy splinting better.  She has not been walking regular shoes.  She denies any other acute complaints she would like to make sure that there is nothing else going on.  She still feels a soreness has not seen anyone else prior to seeing me beside her primary care physician to the x-ray.   Review of Systems: Negative except as noted in the HPI. Denies N/V/F/Ch.  Past Medical History:  Diagnosis Date   Ankylosing spondylitis ([facility])    Chest pain    COVID-19 06/2020   GERD (gastroesophageal reflux disease)    Hypertension    Migraines    RA (rheumatoid arthritis) (HCC)    Thyroid disease    Urinary incontinence     Current Outpatient Medications:    amLODipine (NORVASC) 5 MG tablet, Take 1 tablet (5 mg total) by mouth daily., Disp: 90 tablet, Rfl: 0   cholecalciferol (VITAMIN D) 1000 units tablet, Take 1,000 Units by mouth daily., Disp: , Rfl:    COVID-19 At Home Antigen Test (CARESTART COVID-19 HOME TEST) KIT, Use as directed., Disp: 4 each, Rfl: 0   diclofenac (VOLTAREN) 75 MG EC tablet, Take 1 tablet (75 mg total) by mouth 2 (two) times daily for joint/back pain., Disp: 60 tablet, Rfl: 2   escitalopram (LEXAPRO) 20 MG tablet, Take 1 tablet (20 mg total) by mouth daily., Disp: 90 tablet, Rfl: 0   etanercept (ENBREL SURECLICK) 50 MG/ML injection, INJECT 1 ML UNDER THE SKIN EACH WEEK AS DIRECTED., Disp: 4 mL, Rfl: 1   folic acid (FOLVITE) 1 MG tablet, Take 1 tablet (1 mg total) by mouth  daily., Disp: 90 tablet, Rfl: 3   leflunomide (ARAVA) 20 MG tablet, Take 20 mg by mouth daily., Disp: , Rfl:    leflunomide (ARAVA) 20 MG tablet, TAKE 1 TABLET BY MOUTH ONCE A DAY, Disp: 30 tablet, Rfl: 0   leflunomide (ARAVA) 20 MG tablet, TAKE 1 TABLET BY MOUTH ONCE A DAY, Disp: 90 tablet, Rfl: 0   leflunomide (ARAVA) 20 MG tablet, Take 1 tablet (20 mg total) by mouth daily., Disp: 30 tablet, Rfl: 0   levothyroxine (SYNTHROID) 25 MCG tablet, Take 1 tablet (25 mcg total) by mouth daily before breakfast., Disp: 90 tablet, Rfl: 2   LINZESS 145 MCG CAPS capsule, Take 145 mcg by mouth daily as needed. , Disp: , Rfl:    Magnesium 250 MG TABS, Take 250 mg by mouth daily., Disp: , Rfl:    ondansetron (ZOFRAN) 4 MG tablet, Take 1 tablet (4 mg total) by mouth every 8 (eight) hours as needed for nausea or vomiting., Disp: 6 tablet, Rfl: 0   ondansetron (ZOFRAN-ODT) 4 MG disintegrating tablet, Take 1 tablet (4 mg total) by mouth every 8 (eight) hours as needed for nausea., Disp: 30 tablet, Rfl: 3   predniSONE (DELTASONE) 5 MG tablet, Follow enclosed instruction sheet, Disp: 48 tablet, Rfl: 0   rizatriptan (MAXALT-MLT) 10 MG disintegrating tablet,  Take 1 tablet (10 mg total) by mouth as needed for migraine. May repeat in 2 hours if needed, Disp: 9 tablet, Rfl: 11   Semaglutide-Weight Management 0.25 MG/0.5ML SOAJ, INJECT 0.25 MG INTO THE SKIN ONCE A WEEK FOR 28 DAYS., Disp: 2 mL, Rfl: 0   Semaglutide-Weight Management 0.5 MG/0.5ML SOAJ, INJECT 0.5 MG INTO THE SKIN ONCE A WEEK FOR 28 DAYS., Disp: 2 mL, Rfl: 0   Semaglutide-Weight Management 1 MG/0.5ML SOAJ, INJECT 1 MG INTO THE SKIN ONCE A WEEK FOR 28 DAYS., Disp: 2 mL, Rfl: 0   Semaglutide-Weight Management 1.7 MG/0.75ML SOAJ, INJECT 1.7 MG INTO THE SKIN ONCE A WEEK FOR 28 DAYS., Disp: 3 mL, Rfl: 0   Semaglutide-Weight Management 2.4 MG/0.75ML SOAJ, INJECT 2.4 MG INTO THE SKIN ONCE A WEEK., Disp: 3 mL, Rfl: 3   solifenacin (VESICARE) 10 MG tablet, TAKE 1  TABLET (10 MG TOTAL) BY MOUTH DAILY., Disp: 90 tablet, Rfl: 0   valACYclovir (VALTREX) 1000 MG tablet, Take 1 tablet (1,000 mg total) by mouth 2 (two) times daily., Disp: 60 tablet, Rfl: 3  Social History   Tobacco Use  Smoking Status Never  Smokeless Tobacco Never    No Known Allergies Objective:  There were no vitals filed for this visit. There is no height or weight on file to calculate BMI. Constitutional Well developed. Well nourished.  Vascular Dorsalis pedis pulses palpable bilaterally. Posterior tibial pulses palpable bilaterally. Capillary refill normal to all digits.  No cyanosis or clubbing noted. Pedal hair growth normal.  Neurologic Normal speech. Oriented to person, place, and time. Epicritic sensation to light touch grossly present bilaterally.  Dermatologic Nails well groomed and normal in appearance. No open wounds. No skin lesions.  Orthopedic: Pain on palpation to the left fifth digit.  Pain with range of motion of the second digit.  No ecchymosis noted.  No ulcers noted.  No other bony abnormalities identified.   Radiographs: 3 views of skeletally mature adult left foot Minimally displaced oblique fracture of the fifth proximal phalanx. Assessment:   1. Closed fracture of phalanx of left fifth toe, initial encounter    Plan:  Patient was evaluated and treated and all questions answered.  Left fifth digit proximal phalanx fracture -I explained patient etiology of fracture and various treatment options were discussed.  I encouraged her to continue doing buddy splinting as well as place her in surgical shoe.  I believe this will help allow to clinically healed appropriately.  At this time I discussed with her that we will not need to radiographically evaluate next visit.  As long as her pain is improving she can begin transition to regular shoes.  She states understanding will do so.  No follow-ups on file.

## 2021-03-05 ENCOUNTER — Other Ambulatory Visit (HOSPITAL_COMMUNITY): Payer: Self-pay

## 2021-03-05 ENCOUNTER — Other Ambulatory Visit: Payer: Self-pay | Admitting: Internal Medicine

## 2021-03-05 MED ORDER — ESCITALOPRAM OXALATE 20 MG PO TABS
20.0000 mg | ORAL_TABLET | Freq: Every day | ORAL | 0 refills | Status: DC
  Filled 2021-03-05 – 2021-05-06 (×2): qty 90, 90d supply, fill #0

## 2021-03-05 MED ORDER — AMLODIPINE BESYLATE 5 MG PO TABS
5.0000 mg | ORAL_TABLET | Freq: Every day | ORAL | 0 refills | Status: DC
  Filled 2021-03-05: qty 90, 90d supply, fill #0

## 2021-03-05 MED ORDER — SOLIFENACIN SUCCINATE 10 MG PO TABS
10.0000 mg | ORAL_TABLET | Freq: Every day | ORAL | 0 refills | Status: DC
  Filled 2021-03-05: qty 90, 90d supply, fill #0

## 2021-03-11 ENCOUNTER — Other Ambulatory Visit (HOSPITAL_COMMUNITY): Payer: Self-pay

## 2021-03-11 MED FILL — Semaglutide (Weight Mngmt) Soln Auto-Injector 2.4 MG/0.75ML: SUBCUTANEOUS | 28 days supply | Qty: 3 | Fill #2 | Status: AC

## 2021-03-12 ENCOUNTER — Telehealth: Payer: No Typology Code available for payment source | Admitting: Physician Assistant

## 2021-03-12 ENCOUNTER — Other Ambulatory Visit (HOSPITAL_COMMUNITY): Payer: Self-pay

## 2021-03-12 ENCOUNTER — Encounter: Payer: Self-pay | Admitting: Physician Assistant

## 2021-03-12 DIAGNOSIS — B373 Candidiasis of vulva and vagina: Secondary | ICD-10-CM | POA: Diagnosis not present

## 2021-03-12 DIAGNOSIS — B3731 Acute candidiasis of vulva and vagina: Secondary | ICD-10-CM

## 2021-03-12 MED ORDER — ENBREL SURECLICK 50 MG/ML ~~LOC~~ SOAJ
SUBCUTANEOUS | 1 refills | Status: DC
  Filled 2021-03-12: qty 4, 28d supply, fill #0
  Filled 2021-04-17: qty 4, 28d supply, fill #1

## 2021-03-12 MED ORDER — FLUCONAZOLE 150 MG PO TABS
150.0000 mg | ORAL_TABLET | Freq: Once | ORAL | 0 refills | Status: AC
  Filled 2021-03-12: qty 2, 2d supply, fill #0

## 2021-03-12 MED ORDER — LEFLUNOMIDE 20 MG PO TABS
20.0000 mg | ORAL_TABLET | Freq: Every day | ORAL | 1 refills | Status: DC
  Filled 2021-03-12: qty 90, 90d supply, fill #0
  Filled 2021-05-06 – 2021-08-07 (×2): qty 90, 90d supply, fill #1

## 2021-03-12 NOTE — Patient Instructions (Signed)
Maria Wiley, thank you for joining Mar Daring, PA-C for today's virtual visit.  While this provider is not your primary care provider (PCP), if your PCP is located in our provider database this encounter information will be shared with them immediately following your visit.  Consent: (Patient) Maria Wiley provided verbal consent for this virtual visit at the beginning of the encounter.  Current Medications:  Current Outpatient Medications:    fluconazole (DIFLUCAN) 150 MG tablet, Take 1 tablet (150 mg total) by mouth once for 1 dose. May repeat in 72 hours if needed, Disp: 2 tablet, Rfl: 0   amLODipine (NORVASC) 5 MG tablet, Take 1 tablet (5 mg total) by mouth daily., Disp: 90 tablet, Rfl: 0   cholecalciferol (VITAMIN D) 1000 units tablet, Take 1,000 Units by mouth daily., Disp: , Rfl:    COVID-19 At Home Antigen Test (CARESTART COVID-19 HOME TEST) KIT, Use as directed., Disp: 4 each, Rfl: 0   diclofenac (VOLTAREN) 75 MG EC tablet, Take 1 tablet (75 mg total) by mouth 2 (two) times daily for joint/back pain., Disp: 60 tablet, Rfl: 2   escitalopram (LEXAPRO) 20 MG tablet, Take 1 tablet (20 mg total) by mouth daily., Disp: 90 tablet, Rfl: 0   etanercept (ENBREL SURECLICK) 50 MG/ML injection, INJECT 1 ML UNDER THE SKIN EACH WEEK AS DIRECTED., Disp: 4 mL, Rfl: 1   etanercept (ENBREL SURECLICK) 50 MG/ML injection, INJECT 1 ML UNDER THE SKIN EACH WEEK AS DIRECTED., Disp: 4 mL, Rfl: 1   folic acid (FOLVITE) 1 MG tablet, Take 1 tablet (1 mg total) by mouth daily., Disp: 90 tablet, Rfl: 3   leflunomide (ARAVA) 20 MG tablet, Take 20 mg by mouth daily., Disp: , Rfl:    leflunomide (ARAVA) 20 MG tablet, TAKE 1 TABLET BY MOUTH ONCE A DAY, Disp: 30 tablet, Rfl: 0   leflunomide (ARAVA) 20 MG tablet, TAKE 1 TABLET BY MOUTH ONCE A DAY, Disp: 90 tablet, Rfl: 0   leflunomide (ARAVA) 20 MG tablet, Take 1 tablet (20 mg total) by mouth daily., Disp: 30 tablet, Rfl: 0   leflunomide (ARAVA) 20 MG  tablet, Take 1 tablet (20 mg total) by mouth daily., Disp: 90 tablet, Rfl: 1   levothyroxine (SYNTHROID) 25 MCG tablet, Take 1 tablet (25 mcg total) by mouth daily before breakfast., Disp: 90 tablet, Rfl: 2   LINZESS 145 MCG CAPS capsule, Take 145 mcg by mouth daily as needed. , Disp: , Rfl:    Magnesium 250 MG TABS, Take 250 mg by mouth daily., Disp: , Rfl:    ondansetron (ZOFRAN) 4 MG tablet, Take 1 tablet (4 mg total) by mouth every 8 (eight) hours as needed for nausea or vomiting., Disp: 6 tablet, Rfl: 0   ondansetron (ZOFRAN-ODT) 4 MG disintegrating tablet, Take 1 tablet (4 mg total) by mouth every 8 (eight) hours as needed for nausea., Disp: 30 tablet, Rfl: 3   predniSONE (DELTASONE) 5 MG tablet, Follow enclosed instruction sheet, Disp: 48 tablet, Rfl: 0   rizatriptan (MAXALT-MLT) 10 MG disintegrating tablet, Take 1 tablet (10 mg total) by mouth as needed for migraine. May repeat in 2 hours if needed, Disp: 9 tablet, Rfl: 11   Semaglutide-Weight Management 0.25 MG/0.5ML SOAJ, INJECT 0.25 MG INTO THE SKIN ONCE A WEEK FOR 28 DAYS., Disp: 2 mL, Rfl: 0   Semaglutide-Weight Management 0.5 MG/0.5ML SOAJ, INJECT 0.5 MG INTO THE SKIN ONCE A WEEK FOR 28 DAYS., Disp: 2 mL, Rfl: 0   Semaglutide-Weight Management 1 MG/0.5ML SOAJ,  INJECT 1 MG INTO THE SKIN ONCE A WEEK FOR 28 DAYS., Disp: 2 mL, Rfl: 0   Semaglutide-Weight Management 1.7 MG/0.75ML SOAJ, INJECT 1.7 MG INTO THE SKIN ONCE A WEEK FOR 28 DAYS., Disp: 3 mL, Rfl: 0   Semaglutide-Weight Management 2.4 MG/0.75ML SOAJ, INJECT 2.4 MG INTO THE SKIN ONCE A WEEK., Disp: 3 mL, Rfl: 3   solifenacin (VESICARE) 10 MG tablet, TAKE 1 TABLET (10 MG TOTAL) BY MOUTH DAILY., Disp: 90 tablet, Rfl: 0   valACYclovir (VALTREX) 1000 MG tablet, Take 1 tablet (1,000 mg total) by mouth 2 (two) times daily., Disp: 60 tablet, Rfl: 3   Medications ordered in this encounter:  Meds ordered this encounter  Medications   fluconazole (DIFLUCAN) 150 MG tablet    Sig: Take 1  tablet (150 mg total) by mouth once for 1 dose. May repeat in 72 hours if needed    Dispense:  2 tablet    Refill:  0    Order Specific Question:   Supervising Provider    Answer:   Sabra Heck, [place]     *If you need refills on other medications prior to your next appointment, please contact your pharmacy*  Follow-Up: Call back or seek an in-person evaluation if the symptoms worsen or if the condition fails to improve as anticipated.  Other Instructions See below   If you have been instructed to have an in-person evaluation today at a local Urgent Care facility, please use the link below. It will take you to a list of all of our available [facility], including address, phone number and hours of operation. Please do not delay care.  [facility]  If you or a family member do not have a primary care provider, use the link below to schedule a visit and establish care. When you choose a [facility] primary care physician or advanced practice provider, you gain a long-term partner in health. Find a Primary Care Provider  Learn more about [facility]'s in-office and virtual care options: [facility]  Vaginal Yeast Infection, Adult  Vaginal yeast infection is a condition that causes vaginal discharge as well as soreness, swelling, and redness (inflammation) of the vagina. This is a common condition. Some women get this infectionfrequently. What are the causes? This condition is caused by a change in the normal balance of the yeast (candida) and bacteria that live in the vagina. This change causes an overgrowth ofyeast, which causes the inflammation. What increases the risk? The condition is more likely to develop in women who: Take antibiotic medicines. Have diabetes. Take birth control pills. Are pregnant. Douche often. Have a weak body defense system (immune system). Have been taking steroid medicines for a long time. Frequently wear tight  clothing. What are the signs or symptoms? Symptoms of this condition include: White, thick, creamy vaginal discharge. Swelling, itching, redness, and irritation of the vagina. The lips of the vagina (vulva) may be affected as well. Pain or a burning feeling while urinating. Pain during sex. How is this diagnosed? This condition is diagnosed based on: Your medical history. A physical exam. A pelvic exam. Your health care provider will examine a sample of your vaginal discharge under a microscope. Your health care provider may send this sample for testing to confirm the diagnosis. How is this treated? This condition is treated with medicine. Medicines may be over-the-counter or prescription. You may be told to use one or more of the following: Medicine  that is taken by mouth (orally). Medicine that is applied as a cream (topically). Medicine that is inserted directly into the vagina (suppository). Follow these instructions at home:  Lifestyle Do not have sex until your health care provider approves. Tell your sex partner that you have a yeast infection. That person should go to his or her health care provider and ask if they should also be treated. Do not wear tight clothes, such as pantyhose or tight pants. Wear breathable cotton underwear. General instructions Take or apply over-the-counter and prescription medicines only as told by your health care provider. Eat more yogurt. This may help to keep your yeast infection from returning. Do not use tampons until your health care provider approves. Try taking a sitz bath to help with discomfort. This is a warm water bath that is taken while you are sitting down. The water should only come up to your hips and should cover your buttocks. Do this 3-4 times per day or as told by your health care provider. Do not douche. If you have diabetes, keep your blood sugar levels under control. Keep all follow-up visits as told by your health care  provider. This is important. Contact a health care provider if: You have a fever. Your symptoms go away and then return. Your symptoms do not get better with treatment. Your symptoms get worse. You have new symptoms. You develop blisters in or around your vagina. You have blood coming from your vagina and it is not your menstrual period. You develop pain in your abdomen. Summary Vaginal yeast infection is a condition that causes discharge as well as soreness, swelling, and redness (inflammation) of the vagina. This condition is treated with medicine. Medicines may be over-the-counter or prescription. Take or apply over-the-counter and prescription medicines only as told by your health care provider. Do not douche. Do not have sex or use tampons until your health care provider approves. Contact a health care provider if your symptoms do not get better with treatment or your symptoms go away and then return. This information is not intended to replace advice given to you by your health care provider. Make sure you discuss any questions you have with your healthcare provider. Document Revised: 07/10/2020 Document Reviewed: 12/06/2017 Elsevier Patient Education  [place].

## 2021-03-12 NOTE — Progress Notes (Signed)
Virtual Visit Consent   Maria Wiley, you are scheduled for a virtual visit with a [facility] provider today.     Just as with appointments in the office, your consent must be obtained to participate.  Your consent will be active for this visit and any virtual visit you may have with one of our providers in the next 365 days.     If you have a MyChart account, a copy of this consent can be sent to you electronically.  All virtual visits are billed to your insurance company just like a traditional visit in the office.    As this is a virtual visit, video technology does not allow for your provider to perform a traditional examination.  This may limit your provider's ability to fully assess your condition.  If your provider identifies any concerns that need to be evaluated in person or the need to arrange testing (such as labs, EKG, etc.), we will make arrangements to do so.     Although advances in technology are sophisticated, we cannot ensure that it will always work on either your end or our end.  If the connection with a video visit is poor, the visit may have to be switched to a telephone visit.  With either a video or telephone visit, we are not always able to ensure that we have a secure connection.     I need to obtain your verbal consent now.   Are you willing to proceed with your visit today?    Maria Wiley has provided verbal consent on 03/12/2021 for a virtual visit (video or telephone).   Mar Daring, PA-C   Date: 03/12/2021 7:17 PM   Virtual Visit via Video Note   I, Mar Daring, connected with  Maria Wiley  ([id number], 1976-06-18) on 03/12/21 at  7:15 PM EDT by a video-enabled telemedicine application and verified that I am speaking with the correct person using two identifiers.  Location: Patient: Virtual Visit Location Patient: Home Provider: Virtual Visit Location Provider: Home Office   I discussed the limitations of evaluation and management by  telemedicine and the availability of in person appointments. The patient expressed understanding and agreed to proceed.    History of Present Illness: Maria Wiley is a 45 y.o. who identifies as a female who was assigned female at birth, and is being seen today for possible yeast infection.  HPI: Vaginal Discharge The patient's primary symptoms include genital itching and vaginal discharge. The patient's pertinent negatives include no genital lesions or genital odor. This is a new problem. The problem occurs intermittently. The problem has been gradually worsening. The patient is experiencing no pain. Pertinent negatives include no abdominal pain, back pain, dysuria, frequency, nausea or painful intercourse. The vaginal discharge was white. There has been no bleeding. Nothing aggravates the symptoms. She has tried nothing for the symptoms.  Symptoms feel like previous yeast infections.  Problems:  Patient Active Problem List   Diagnosis Date Noted   Left foot pain 02/07/2021   Numbness and tingling 02/07/2021   Acute medial meniscus tear of right knee 09/25/2020   Morbid obesity ([place]) 09/05/2020   Acute internal derangement of right knee 08/10/2020   Injury of right knee 08/07/2020   Chronic migraine without aura without status migrainosus, not intractable 05/10/2019   Allergic rhinitis 05/08/2019   Essential hypertension 03/10/2019   Attention and concentration deficit 01/24/2019   Ankylosing spondylitis ([place]) 01/15/2018   OA (osteoarthritis) of knee 01/15/2018   Routine  general medical examination at a health care facility 01/15/2018   IBS (irritable bowel syndrome) 01/15/2018   RA (rheumatoid arthritis) (HCC)    Hypothyroidism     Allergies: No Known Allergies Medications:  Current Outpatient Medications:    fluconazole (DIFLUCAN) 150 MG tablet, Take 1 tablet (150 mg total) by mouth once for 1 dose. May repeat in 72 hours if needed, Disp: 2 tablet, Rfl: 0   amLODipine (NORVASC) 5  MG tablet, Take 1 tablet (5 mg total) by mouth daily., Disp: 90 tablet, Rfl: 0   cholecalciferol (VITAMIN D) 1000 units tablet, Take 1,000 Units by mouth daily., Disp: , Rfl:    COVID-19 At Home Antigen Test (CARESTART COVID-19 HOME TEST) KIT, Use as directed., Disp: 4 each, Rfl: 0   diclofenac (VOLTAREN) 75 MG EC tablet, Take 1 tablet (75 mg total) by mouth 2 (two) times daily for joint/back pain., Disp: 60 tablet, Rfl: 2   escitalopram (LEXAPRO) 20 MG tablet, Take 1 tablet (20 mg total) by mouth daily., Disp: 90 tablet, Rfl: 0   etanercept (ENBREL SURECLICK) 50 MG/ML injection, INJECT 1 ML UNDER THE SKIN EACH WEEK AS DIRECTED., Disp: 4 mL, Rfl: 1   etanercept (ENBREL SURECLICK) 50 MG/ML injection, INJECT 1 ML UNDER THE SKIN EACH WEEK AS DIRECTED., Disp: 4 mL, Rfl: 1   folic acid (FOLVITE) 1 MG tablet, Take 1 tablet (1 mg total) by mouth daily., Disp: 90 tablet, Rfl: 3   leflunomide (ARAVA) 20 MG tablet, Take 20 mg by mouth daily., Disp: , Rfl:    leflunomide (ARAVA) 20 MG tablet, TAKE 1 TABLET BY MOUTH ONCE A DAY, Disp: 30 tablet, Rfl: 0   leflunomide (ARAVA) 20 MG tablet, TAKE 1 TABLET BY MOUTH ONCE A DAY, Disp: 90 tablet, Rfl: 0   leflunomide (ARAVA) 20 MG tablet, Take 1 tablet (20 mg total) by mouth daily., Disp: 30 tablet, Rfl: 0   leflunomide (ARAVA) 20 MG tablet, Take 1 tablet (20 mg total) by mouth daily., Disp: 90 tablet, Rfl: 1   levothyroxine (SYNTHROID) 25 MCG tablet, Take 1 tablet (25 mcg total) by mouth daily before breakfast., Disp: 90 tablet, Rfl: 2   LINZESS 145 MCG CAPS capsule, Take 145 mcg by mouth daily as needed. , Disp: , Rfl:    Magnesium 250 MG TABS, Take 250 mg by mouth daily., Disp: , Rfl:    ondansetron (ZOFRAN) 4 MG tablet, Take 1 tablet (4 mg total) by mouth every 8 (eight) hours as needed for nausea or vomiting., Disp: 6 tablet, Rfl: 0   ondansetron (ZOFRAN-ODT) 4 MG disintegrating tablet, Take 1 tablet (4 mg total) by mouth every 8 (eight) hours as needed for  nausea., Disp: 30 tablet, Rfl: 3   predniSONE (DELTASONE) 5 MG tablet, Follow enclosed instruction sheet, Disp: 48 tablet, Rfl: 0   rizatriptan (MAXALT-MLT) 10 MG disintegrating tablet, Take 1 tablet (10 mg total) by mouth as needed for migraine. May repeat in 2 hours if needed, Disp: 9 tablet, Rfl: 11   Semaglutide-Weight Management 0.25 MG/0.5ML SOAJ, INJECT 0.25 MG INTO THE SKIN ONCE A WEEK FOR 28 DAYS., Disp: 2 mL, Rfl: 0   Semaglutide-Weight Management 0.5 MG/0.5ML SOAJ, INJECT 0.5 MG INTO THE SKIN ONCE A WEEK FOR 28 DAYS., Disp: 2 mL, Rfl: 0   Semaglutide-Weight Management 1 MG/0.5ML SOAJ, INJECT 1 MG INTO THE SKIN ONCE A WEEK FOR 28 DAYS., Disp: 2 mL, Rfl: 0   Semaglutide-Weight Management 1.7 MG/0.75ML SOAJ, INJECT 1.7 MG INTO THE SKIN ONCE  A WEEK FOR 28 DAYS., Disp: 3 mL, Rfl: 0   Semaglutide-Weight Management 2.4 MG/0.75ML SOAJ, INJECT 2.4 MG INTO THE SKIN ONCE A WEEK., Disp: 3 mL, Rfl: 3   solifenacin (VESICARE) 10 MG tablet, TAKE 1 TABLET (10 MG TOTAL) BY MOUTH DAILY., Disp: 90 tablet, Rfl: 0   valACYclovir (VALTREX) 1000 MG tablet, Take 1 tablet (1,000 mg total) by mouth 2 (two) times daily., Disp: 60 tablet, Rfl: 3  Observations/Objective: Patient is well-developed, well-nourished in no acute distress.  Resting comfortably at home.  Head is normocephalic, atraumatic.  No labored breathing.  Speech is clear and coherent with logical content.  Patient is alert and oriented at baseline.    Assessment and Plan: 1. Vaginal yeast infection - fluconazole (DIFLUCAN) 150 MG tablet; Take 1 tablet (150 mg total) by mouth once for 1 dose. May repeat in 72 hours if needed  Dispense: 2 tablet; Refill: 0  - Symptoms c/w yeast vaginitis - Diflucan provided as above - Please seek in person evaluation if symptoms fail to improve or worsen after treatment  Follow Up Instructions: I discussed the assessment and treatment plan with the patient. The patient was provided an opportunity to ask  questions and all were answered. The patient agreed with the plan and demonstrated an understanding of the instructions.  A copy of instructions were sent to the patient via MyChart.  The patient was advised to call back or seek an in-person evaluation if the symptoms worsen or if the condition fails to improve as anticipated.  Time:  I spent 6 minutes with the patient via telehealth technology discussing the above problems/concerns.    Mar Daring, PA-C

## 2021-03-13 ENCOUNTER — Other Ambulatory Visit (HOSPITAL_COMMUNITY): Payer: Self-pay

## 2021-03-21 ENCOUNTER — Ambulatory Visit: Payer: No Typology Code available for payment source | Admitting: Podiatry

## 2021-03-24 ENCOUNTER — Ambulatory Visit (INDEPENDENT_AMBULATORY_CARE_PROVIDER_SITE_OTHER): Payer: No Typology Code available for payment source | Admitting: Otolaryngology

## 2021-03-25 ENCOUNTER — Other Ambulatory Visit (HOSPITAL_COMMUNITY): Payer: Self-pay

## 2021-04-14 ENCOUNTER — Other Ambulatory Visit (HOSPITAL_COMMUNITY): Payer: Self-pay

## 2021-04-14 ENCOUNTER — Other Ambulatory Visit: Payer: Self-pay | Admitting: Internal Medicine

## 2021-04-14 MED FILL — Semaglutide (Weight Mngmt) Soln Auto-Injector 2.4 MG/0.75ML: SUBCUTANEOUS | 28 days supply | Qty: 3 | Fill #3 | Status: CN

## 2021-04-15 ENCOUNTER — Other Ambulatory Visit (HOSPITAL_COMMUNITY): Payer: Self-pay

## 2021-04-15 ENCOUNTER — Other Ambulatory Visit: Payer: Self-pay | Admitting: Internal Medicine

## 2021-04-16 ENCOUNTER — Other Ambulatory Visit (HOSPITAL_COMMUNITY): Payer: Self-pay

## 2021-04-16 MED ORDER — DICLOFENAC SODIUM 75 MG PO TBEC
75.0000 mg | DELAYED_RELEASE_TABLET | Freq: Two times a day (BID) | ORAL | 2 refills | Status: DC
  Filled 2021-04-16: qty 60, 30d supply, fill #0
  Filled 2021-06-03: qty 60, 30d supply, fill #1
  Filled 2021-06-24: qty 60, 30d supply, fill #2

## 2021-04-17 ENCOUNTER — Other Ambulatory Visit (HOSPITAL_COMMUNITY): Payer: Self-pay

## 2021-04-17 MED ORDER — CARESTART COVID-19 HOME TEST VI KIT
PACK | 0 refills | Status: DC
  Filled 2021-04-17: qty 4, 4d supply, fill #0

## 2021-04-18 ENCOUNTER — Other Ambulatory Visit (HOSPITAL_COMMUNITY): Payer: Self-pay

## 2021-04-23 ENCOUNTER — Other Ambulatory Visit (HOSPITAL_COMMUNITY): Payer: Self-pay

## 2021-04-25 ENCOUNTER — Other Ambulatory Visit (HOSPITAL_COMMUNITY): Payer: Self-pay

## 2021-04-25 ENCOUNTER — Encounter: Payer: Self-pay | Admitting: Internal Medicine

## 2021-04-25 MED FILL — Semaglutide (Weight Mngmt) Soln Auto-Injector 2.4 MG/0.75ML: SUBCUTANEOUS | 28 days supply | Qty: 3 | Fill #3 | Status: CN

## 2021-04-30 ENCOUNTER — Other Ambulatory Visit (HOSPITAL_COMMUNITY): Payer: Self-pay

## 2021-05-06 ENCOUNTER — Other Ambulatory Visit: Payer: Self-pay | Admitting: Internal Medicine

## 2021-05-06 ENCOUNTER — Other Ambulatory Visit (HOSPITAL_COMMUNITY): Payer: Self-pay

## 2021-05-06 ENCOUNTER — Other Ambulatory Visit: Payer: Self-pay | Admitting: Pharmacist

## 2021-05-07 ENCOUNTER — Other Ambulatory Visit (HOSPITAL_COMMUNITY): Payer: Self-pay

## 2021-05-07 MED ORDER — SOLIFENACIN SUCCINATE 10 MG PO TABS
10.0000 mg | ORAL_TABLET | Freq: Every day | ORAL | 0 refills | Status: DC
  Filled 2021-05-07 – 2021-06-24 (×2): qty 90, 90d supply, fill #0

## 2021-05-07 MED ORDER — AMLODIPINE BESYLATE 5 MG PO TABS
5.0000 mg | ORAL_TABLET | Freq: Every day | ORAL | 0 refills | Status: DC
  Filled 2021-05-07 – 2021-06-03 (×2): qty 90, 90d supply, fill #0

## 2021-05-08 ENCOUNTER — Telehealth: Payer: Self-pay

## 2021-05-08 ENCOUNTER — Other Ambulatory Visit (HOSPITAL_COMMUNITY): Payer: Self-pay

## 2021-05-08 MED ORDER — CARESTART COVID-19 HOME TEST VI KIT
PACK | 0 refills | Status: DC
  Filled 2021-05-08: qty 4, 4d supply, fill #0

## 2021-05-08 NOTE — Telephone Encounter (Signed)
PA started for [facility]  (Key: [id number])

## 2021-05-09 ENCOUNTER — Other Ambulatory Visit (HOSPITAL_COMMUNITY): Payer: Self-pay

## 2021-05-15 ENCOUNTER — Other Ambulatory Visit (HOSPITAL_COMMUNITY): Payer: Self-pay

## 2021-05-15 MED FILL — Semaglutide (Weight Mngmt) Soln Auto-Injector 2.4 MG/0.75ML: SUBCUTANEOUS | 28 days supply | Qty: 3 | Fill #3 | Status: CN

## 2021-05-16 ENCOUNTER — Other Ambulatory Visit (HOSPITAL_COMMUNITY): Payer: Self-pay

## 2021-05-21 ENCOUNTER — Other Ambulatory Visit: Payer: Self-pay

## 2021-05-21 ENCOUNTER — Ambulatory Visit (INDEPENDENT_AMBULATORY_CARE_PROVIDER_SITE_OTHER): Payer: No Typology Code available for payment source | Admitting: Otolaryngology

## 2021-05-21 DIAGNOSIS — J310 Chronic rhinitis: Secondary | ICD-10-CM | POA: Diagnosis not present

## 2021-05-21 NOTE — Progress Notes (Signed)
HPI: Maria Wiley is a 45 y.o. female who presents is referred by her PCP for evaluation of nasal sinus complaints.  She states that she keeps getting sinus infections and has been on antibiotics in the past for sinus infections.  She also complains of congestion and postnasal drainage.  She has been using Flonase regularly in the morning as well as Zyrtec intermittently.  She has occasionally been treated with antibiotics.  She complains of chronic problems with postnasal drainage and bad breath. She also occasionally gets fullness or pressure in ears where she has to "pop her ears".  She feels like she has lost some hearing She has not previously seen an allergist.  Past Medical History:  Diagnosis Date   Ankylosing spondylitis ([place])    Chest pain    COVID-19 06/2020   GERD (gastroesophageal reflux disease)    Hypertension    Migraines    RA (rheumatoid arthritis) ([place])    Thyroid disease    Urinary incontinence    Past Surgical History:  Procedure Laterality Date   ABDOMINAL HYSTERECTOMY     APPENDECTOMY     CHOLECYSTECTOMY     HERNIA REPAIR  2004   KNEE SURGERY Right 2006   Social History   Socioeconomic History   Marital status: Married    Spouse name: Not on file   Number of children: 4   Years of education: Not on file   Highest education level: Bachelor's degree (e.g., BA, AB, BS)  Occupational History   Not on file  Tobacco Use   Smoking status: Never   Smokeless tobacco: Never  Vaping Use   Vaping Use: Never used  Substance and Sexual Activity   Alcohol use: Never   Drug use: Never   Sexual activity: Yes    Birth control/protection: Surgical  Other Topics Concern   Not on file  Social History Narrative   Lives at home with her husband & her 4 sons   Right handed   Caffeine: 2-3 cups per day   Social Determinants of Health   Financial Resource Strain: Not on file  Food Insecurity: Not on file  Transportation Needs: Not on file  Physical Activity:  Not on file  Stress: Not on file  Social Connections: Not on file   Family History  Problem Relation Age of Onset   CAD Maternal Grandfather    Diabetes Maternal Grandfather    Hearing loss Maternal Grandfather    Heart disease Maternal Grandfather    Hyperlipidemia Maternal Grandfather    Hypertension Mother    Arthritis Mother    Arthritis Maternal Grandmother    Heart disease Maternal Grandmother    Hyperlipidemia Maternal Grandmother    Migraines Neg Hx    Headache Neg Hx    No Known Allergies Prior to Admission medications   Medication Sig Start Date End Date Taking? Authorizing Provider  amLODipine (NORVASC) 5 MG tablet Take 1 tablet (5 mg total) by mouth daily. 05/07/21   Hoyt Koch, MD  cholecalciferol (VITAMIN D) 1000 units tablet Take 1,000 Units by mouth daily.    [provider]  COVID-19 At Home Antigen Test [facility] COVID-19 HOME TEST) KIT Take as directed 05/08/21   Jefm Bryant, RPH  diclofenac (VOLTAREN) 75 MG EC tablet Take 1 tablet (75 mg total) by mouth 2 (two) times daily for joint/back pain. 04/16/21   Hoyt Koch, MD  escitalopram (LEXAPRO) 20 MG tablet Take 1 tablet (20 mg total) by mouth daily. 03/05/21  Hoyt Koch, MD  etanercept (ENBREL SURECLICK) 50 MG/ML injection INJECT 1 ML UNDER THE SKIN EACH WEEK AS DIRECTED. 12/31/20   Tresa Garter, MD  etanercept (ENBREL SURECLICK) 50 MG/ML injection INJECT 1 ML UNDER THE SKIN EACH WEEK AS DIRECTED. 5/42/70     folic acid (FOLVITE) 1 MG tablet Take 1 tablet (1 mg total) by mouth daily. 01/14/18   Hoyt Koch, MD  leflunomide (ARAVA) 20 MG tablet Take 20 mg by mouth daily.    [provider]  leflunomide (ARAVA) 20 MG tablet TAKE 1 TABLET BY MOUTH ONCE A DAY 10/14/20 10/14/21  Bjorn Pippin, PA-C  leflunomide (ARAVA) 20 MG tablet TAKE 1 TABLET BY MOUTH ONCE A DAY 07/24/20 07/24/21  Bjorn Pippin, PA-C  leflunomide (ARAVA) 20 MG tablet Take 1  tablet (20 mg total) by mouth daily. 12/26/20   Hoyt Koch, MD  leflunomide (ARAVA) 20 MG tablet Take 1 tablet (20 mg total) by mouth daily. 03/12/21     levothyroxine (SYNTHROID) 25 MCG tablet Take 1 tablet (25 mcg total) by mouth daily before breakfast. 01/13/21 01/13/22  Hoyt Koch, MD  LINZESS 145 MCG CAPS capsule Take 145 mcg by mouth daily as needed.  07/25/17   [provider]  Magnesium 250 MG TABS Take 250 mg by mouth daily.    [provider]  ondansetron (ZOFRAN) 4 MG tablet Take 1 tablet (4 mg total) by mouth every 8 (eight) hours as needed for nausea or vomiting. 11/12/18   Couture, Cortni S, PA-C  ondansetron (ZOFRAN-ODT) 4 MG disintegrating tablet Take 1 tablet (4 mg total) by mouth every 8 (eight) hours as needed for nausea. 05/10/19   Melvenia Beam, MD  predniSONE (DELTASONE) 5 MG tablet Follow enclosed instruction sheet 12/09/20     rizatriptan (MAXALT-MLT) 10 MG disintegrating tablet Take 1 tablet (10 mg total) by mouth as needed for migraine. May repeat in 2 hours if needed 05/10/19   Melvenia Beam, MD  Semaglutide-Weight Management 0.25 MG/0.5ML SOAJ INJECT 0.25 MG INTO THE SKIN ONCE A WEEK FOR 28 DAYS. 08/19/20 08/19/21  Hoyt Koch, MD  Semaglutide-Weight Management 0.5 MG/0.5ML SOAJ INJECT 0.5 MG INTO THE SKIN ONCE A WEEK FOR 28 DAYS. 08/19/20 08/19/21  Hoyt Koch, MD  Semaglutide-Weight Management 1 MG/0.5ML SOAJ INJECT 1 MG INTO THE SKIN ONCE A WEEK FOR 28 DAYS. 08/19/20   Hoyt Koch, MD  Semaglutide-Weight Management 1.7 MG/0.75ML SOAJ INJECT 1.7 MG INTO THE SKIN ONCE A WEEK FOR 28 DAYS. 08/19/20 08/19/21  Hoyt Koch, MD  Semaglutide-Weight Management 2.4 MG/0.75ML SOAJ INJECT 2.4 MG INTO THE SKIN ONCE A WEEK. 08/19/20 08/19/21  Hoyt Koch, MD  solifenacin (VESICARE) 10 MG tablet TAKE 1 TABLET (10 MG TOTAL) BY MOUTH DAILY. 05/07/21   Hoyt Koch, MD  valACYclovir (VALTREX) 1000 MG  tablet Take 1 tablet (1,000 mg total) by mouth 2 (two) times daily. 02/01/20   Hoyt Koch, MD     Positive ROS: Otherwise negative  All other systems have been reviewed and were otherwise negative with the exception of those mentioned in the HPI and as above.  Physical Exam: Constitutional: Alert, well-appearing, no acute distress Ears: External ears without lesions or tenderness. Ear canals are clear bilaterally with intact, clear TMs bilaterally with good mobility on pneumatic otoscopy and clear middle ear spaces.  On hearing screening with the 1024 tuning fork she is noted to have a mild subjective sensorineural  hearing loss in both ears.  But this is minimal. Nasal: External nose without lesions. Septum with minimal deformity and moderate rhinitis.  After decongesting the nose with Afrin spray both millimeters regions were clear.  No polyps noted.  Clear mucus within the nasal cavity.  No signs of infection..  Oral: Lips and gums without lesions. Tongue and palate mucosa without lesions. Posterior oropharynx clear. Neck: No palpable adenopathy or masses Respiratory: Breathing comfortably  Skin: No facial/neck lesions or rash noted.  Procedures  Assessment: Chronic rhinitis  Plan: Discussed with her concerning treatment options. Would recommend regular use of Flonase 2 sprays each nostril regularly but would use it at night instead of first thing in the morning.  Also reviewed with her concerning use of saline nasal sprays that will help with postnasal drainage.  Suggested using the AYR or Xlear brands. She might benefit by seeing an allergist if the above therapy is not beneficial. Reviewed with her that if she continues to have recurrent sinus infections that surgery might be an option down the line but she would have to follow-up with one of the other ENT groups as I am retiring at the end of the month.   Radene Journey, MD   CC:

## 2021-05-23 ENCOUNTER — Other Ambulatory Visit (HOSPITAL_COMMUNITY): Payer: Self-pay

## 2021-05-26 ENCOUNTER — Other Ambulatory Visit: Payer: Self-pay | Admitting: Pharmacist

## 2021-05-26 ENCOUNTER — Other Ambulatory Visit (HOSPITAL_COMMUNITY): Payer: Self-pay

## 2021-05-26 MED ORDER — ENBREL SURECLICK 50 MG/ML ~~LOC~~ SOAJ
SUBCUTANEOUS | 1 refills | Status: DC
  Filled 2021-05-26 (×2): qty 4, 28d supply, fill #0
  Filled 2021-06-20: qty 4, 28d supply, fill #1

## 2021-05-26 MED ORDER — ENBREL SURECLICK 50 MG/ML ~~LOC~~ SOAJ: SUBCUTANEOUS | 1 refills | Status: DC

## 2021-05-27 ENCOUNTER — Other Ambulatory Visit (HOSPITAL_COMMUNITY): Payer: Self-pay

## 2021-05-28 ENCOUNTER — Other Ambulatory Visit (HOSPITAL_COMMUNITY): Payer: Self-pay

## 2021-05-29 ENCOUNTER — Other Ambulatory Visit (HOSPITAL_COMMUNITY): Payer: Self-pay

## 2021-05-29 MED FILL — Semaglutide (Weight Mngmt) Soln Auto-Injector 2.4 MG/0.75ML: SUBCUTANEOUS | 28 days supply | Qty: 3 | Fill #3 | Status: AC

## 2021-06-03 ENCOUNTER — Other Ambulatory Visit (HOSPITAL_COMMUNITY): Payer: Self-pay

## 2021-06-03 MED FILL — Leflunomide Tab 20 MG: ORAL | 30 days supply | Qty: 30 | Fill #0 | Status: AC

## 2021-06-04 ENCOUNTER — Other Ambulatory Visit: Payer: Self-pay | Admitting: Internal Medicine

## 2021-06-04 DIAGNOSIS — Z1231 Encounter for screening mammogram for malignant neoplasm of breast: Secondary | ICD-10-CM

## 2021-06-17 ENCOUNTER — Other Ambulatory Visit (HOSPITAL_COMMUNITY): Payer: Self-pay

## 2021-06-20 ENCOUNTER — Other Ambulatory Visit (HOSPITAL_COMMUNITY): Payer: Self-pay

## 2021-06-23 ENCOUNTER — Other Ambulatory Visit (HOSPITAL_COMMUNITY): Payer: Self-pay

## 2021-06-24 ENCOUNTER — Other Ambulatory Visit: Payer: Self-pay | Admitting: Internal Medicine

## 2021-06-24 ENCOUNTER — Other Ambulatory Visit (HOSPITAL_COMMUNITY): Payer: Self-pay

## 2021-06-24 MED ORDER — WEGOVY 2.4 MG/0.75ML ~~LOC~~ SOAJ
2.4000 mg | SUBCUTANEOUS | 3 refills | Status: DC
  Filled 2021-06-24: qty 3, 28d supply, fill #0
  Filled 2021-07-15: qty 3, 28d supply, fill #1

## 2021-06-27 ENCOUNTER — Other Ambulatory Visit (HOSPITAL_COMMUNITY): Payer: Self-pay

## 2021-07-08 ENCOUNTER — Ambulatory Visit: Admit: 2021-07-08 | Discharge status: Absent without leave | Payer: No Typology Code available for payment source

## 2021-07-10 ENCOUNTER — Ambulatory Visit
Admission: RE | Admit: 2021-07-10 | Discharge: 2021-07-10 | Disposition: A | Discharge status: Home / Self Care | Payer: No Typology Code available for payment source | Source: Ambulatory Visit | Attending: Internal Medicine | Admitting: Internal Medicine

## 2021-07-10 ENCOUNTER — Other Ambulatory Visit: Payer: Self-pay

## 2021-07-10 DIAGNOSIS — Z1231 Encounter for screening mammogram for malignant neoplasm of breast: Secondary | ICD-10-CM

## 2021-07-15 ENCOUNTER — Other Ambulatory Visit (HOSPITAL_COMMUNITY): Payer: Self-pay

## 2021-07-16 ENCOUNTER — Other Ambulatory Visit (HOSPITAL_COMMUNITY): Payer: Self-pay

## 2021-07-22 ENCOUNTER — Other Ambulatory Visit (HOSPITAL_COMMUNITY): Payer: Self-pay

## 2021-07-22 ENCOUNTER — Other Ambulatory Visit: Payer: Self-pay | Admitting: Pharmacist

## 2021-07-22 MED ORDER — ENBREL SURECLICK 50 MG/ML ~~LOC~~ SOAJ: SUBCUTANEOUS | 1 refills | Status: DC

## 2021-07-22 MED ORDER — ENBREL SURECLICK 50 MG/ML ~~LOC~~ SOAJ
SUBCUTANEOUS | 1 refills | Status: DC
Start: 2021-07-22 — End: 2021-09-30
  Filled 2021-07-22: qty 4, 28d supply, fill #0
  Filled 2021-09-01: qty 4, 28d supply, fill #1

## 2021-08-05 ENCOUNTER — Other Ambulatory Visit (HOSPITAL_COMMUNITY): Payer: Self-pay

## 2021-08-07 ENCOUNTER — Other Ambulatory Visit: Payer: Self-pay | Admitting: Internal Medicine

## 2021-08-07 ENCOUNTER — Other Ambulatory Visit (HOSPITAL_COMMUNITY): Payer: Self-pay

## 2021-08-08 ENCOUNTER — Other Ambulatory Visit (HOSPITAL_COMMUNITY): Payer: Self-pay

## 2021-08-08 MED ORDER — ESCITALOPRAM OXALATE 20 MG PO TABS
20.0000 mg | ORAL_TABLET | Freq: Every day | ORAL | 0 refills | Status: DC
  Filled 2021-08-08: qty 90, 90d supply, fill #0

## 2021-08-08 MED ORDER — DICLOFENAC SODIUM 75 MG PO TBEC
75.0000 mg | DELAYED_RELEASE_TABLET | Freq: Two times a day (BID) | ORAL | 2 refills | Status: DC
  Filled 2021-08-08: qty 60, 30d supply, fill #0
  Filled 2021-09-17: qty 60, 30d supply, fill #1
  Filled 2021-10-06: qty 60, 30d supply, fill #2

## 2021-08-14 ENCOUNTER — Other Ambulatory Visit: Payer: Self-pay

## 2021-08-14 ENCOUNTER — Encounter (HOSPITAL_COMMUNITY): Payer: Self-pay

## 2021-08-14 ENCOUNTER — Ambulatory Visit (HOSPITAL_COMMUNITY)
Admission: EM | Admit: 2021-08-14 | Discharge: 2021-08-14 | Disposition: A | Discharge status: Home / Self Care | Payer: No Typology Code available for payment source | Attending: Emergency Medicine | Admitting: Emergency Medicine

## 2021-08-14 DIAGNOSIS — M069 Rheumatoid arthritis, unspecified: Secondary | ICD-10-CM | POA: Diagnosis not present

## 2021-08-14 DIAGNOSIS — R5383 Other fatigue: Secondary | ICD-10-CM | POA: Diagnosis not present

## 2021-08-14 DIAGNOSIS — J029 Acute pharyngitis, unspecified: Secondary | ICD-10-CM | POA: Insufficient documentation

## 2021-08-14 DIAGNOSIS — R059 Cough, unspecified: Secondary | ICD-10-CM | POA: Diagnosis present

## 2021-08-14 DIAGNOSIS — R0981 Nasal congestion: Secondary | ICD-10-CM | POA: Diagnosis not present

## 2021-08-14 DIAGNOSIS — K219 Gastro-esophageal reflux disease without esophagitis: Secondary | ICD-10-CM | POA: Diagnosis not present

## 2021-08-14 DIAGNOSIS — I10 Essential (primary) hypertension: Secondary | ICD-10-CM | POA: Insufficient documentation

## 2021-08-14 DIAGNOSIS — J069 Acute upper respiratory infection, unspecified: Secondary | ICD-10-CM | POA: Diagnosis not present

## 2021-08-14 DIAGNOSIS — Z8616 Personal history of COVID-19: Secondary | ICD-10-CM | POA: Insufficient documentation

## 2021-08-14 DIAGNOSIS — Z20822 Contact with and (suspected) exposure to covid-19: Secondary | ICD-10-CM | POA: Insufficient documentation

## 2021-08-14 LAB — POC INFLUENZA A AND B ANTIGEN (URGENT CARE ONLY)
INFLUENZA A ANTIGEN, POC: NEGATIVE
INFLUENZA B ANTIGEN, POC: NEGATIVE

## 2021-08-14 LAB — SARS CORONAVIRUS 2 (TAT 6-24 HRS): SARS Coronavirus 2: NEGATIVE

## 2021-08-14 MED ORDER — KETOROLAC TROMETHAMINE 30 MG/ML IJ SOLN
30.0000 mg | Freq: Once | INTRAMUSCULAR | Status: AC
  Administered 2021-08-14: 30 mg via INTRAMUSCULAR

## 2021-08-14 MED ORDER — KETOROLAC TROMETHAMINE 30 MG/ML IJ SOLN
INTRAMUSCULAR | Status: AC
  Filled 2021-08-14: qty 1

## 2021-08-14 NOTE — Discharge Instructions (Addendum)
Your symptoms today are most likely being caused by a virus and should steadily improve in time it can take up to 7 to 10 days before you truly start to see a turnaround however things will get better  Flu test is negative , COVID test pending, you will be notified of any positive results, based on your medical history qualify for antiviral treatment    You can take Tylenol 1000 mg every 6 hours and/or Ibuprofen 800 mg every 8 hours as needed for pain relief.   For cough: honey 1/2 to 1 teaspoon (you can dilute the honey in water or another fluid).  You can also use guaifenesin and dextromethorphan for cough. You can use a humidifier for chest congestion and cough.  If you don't have a humidifier, you can sit in the bathroom with the hot shower running.      For sore throat: try warm salt water gargles, cepacol lozenges, throat spray, warm tea or water with lemon/honey, popsicles or ice, or OTC cold relief medicine for throat discomfort.   For congestion: take a daily anti-histamine like Zyrtec, Claritin, and a oral decongestant, such as pseudoephedrine.  You can also use Flonase 1-2 sprays in each nostril daily.   It is important to stay hydrated: drink plenty of fluids (water, gatorade/powerade/pedialyte, juices, or teas) to keep your throat moisturized and help further relieve irritation/discomfort.

## 2021-08-14 NOTE — ED Provider Notes (Signed)
[facility]    CSN: [id number] Arrival date & time: 08/14/21  1700      History   Chief Complaint Chief Complaint  Patient presents with   URI    HPI Maria Wiley is a 46 y.o. female.   Patient presents with chills, increased fatigue, nasal congestion, sore throat, nonproductive cough and constant generalized headache For 3 days. Last episode of vomiting 2 days ago. Decreased appetite but able tolerate food and liquids. No known sick contatcs. Hs attempted use advil cold and sinus, minimally helpful. Home covid test negative.  History of hypertension, migraines, rheumatoid arthritis, thyroid disease, GERD.  Past Medical History:  Diagnosis Date   Ankylosing spondylitis ([place])    Chest pain    COVID-19 06/2020   GERD (gastroesophageal reflux disease)    Hypertension    Migraines    RA (rheumatoid arthritis) ([place])    Thyroid disease    Urinary incontinence     Patient Active Problem List   Diagnosis Date Noted   Left foot pain 02/07/2021   Numbness and tingling 02/07/2021   Acute medial meniscus tear of right knee 09/25/2020   Morbid obesity ([place]) 09/05/2020   Acute internal derangement of right knee 08/10/2020   Injury of right knee 08/07/2020   Chronic migraine without aura without status migrainosus, not intractable 05/10/2019   Allergic rhinitis 05/08/2019   Essential hypertension 03/10/2019   Attention and concentration deficit 01/24/2019   Ankylosing spondylitis ([place]) 01/15/2018   OA (osteoarthritis) of knee 01/15/2018   Routine general medical examination at a health care facility 01/15/2018   IBS (irritable bowel syndrome) 01/15/2018   RA (rheumatoid arthritis) ([place])    Hypothyroidism     Past Surgical History:  Procedure Laterality Date   ABDOMINAL HYSTERECTOMY     APPENDECTOMY     CHOLECYSTECTOMY     HERNIA REPAIR  2004   KNEE SURGERY Right 2006    OB History   No obstetric history on file.      Home Medications    Prior to  Admission medications   Medication Sig Start Date End Date Taking? Authorizing Provider  amLODipine (NORVASC) 5 MG tablet Take 1 tablet (5 mg total) by mouth daily. 05/07/21   Hoyt Koch, MD  cholecalciferol (VITAMIN D) 1000 units tablet Take 1,000 Units by mouth daily.    [provider]  COVID-19 At Home Antigen Test [facility] COVID-19 HOME TEST) KIT Take as directed 05/08/21   Jefm Bryant, RPH  diclofenac (VOLTAREN) 75 MG EC tablet Take 1 tablet (75 mg total) by mouth 2 (two) times daily for joint/back pain. 08/08/21   Ailene Ards, NP  escitalopram (LEXAPRO) 20 MG tablet Take 1 tablet (20 mg total) by mouth daily. 08/08/21   Hoyt Koch, MD  etanercept (ENBREL SURECLICK) 50 MG/ML injection INJECT 1 ML UNDER THE SKIN EACH WEEK AS DIRECTED. 07/22/21   Tresa Garter, MD  folic acid (FOLVITE) 1 MG tablet Take 1 tablet (1 mg total) by mouth daily. 01/14/18   Hoyt Koch, MD  leflunomide (ARAVA) 20 MG tablet Take 20 mg by mouth daily.    [provider]  leflunomide (ARAVA) 20 MG tablet TAKE 1 TABLET BY MOUTH ONCE A DAY 10/14/20 10/14/21  Bjorn Pippin, PA-C  leflunomide (ARAVA) 20 MG tablet TAKE 1 TABLET BY MOUTH ONCE A DAY 07/24/20 07/24/21  Bjorn Pippin, PA-C  leflunomide (ARAVA) 20 MG tablet Take 1 tablet (20 mg total) by  mouth daily. 12/26/20   Hoyt Koch, MD  leflunomide (ARAVA) 20 MG tablet Take 1 tablet (20 mg total) by mouth daily. 03/12/21     levothyroxine (SYNTHROID) 25 MCG tablet Take 1 tablet (25 mcg total) by mouth daily before breakfast. 01/13/21 01/13/22  Hoyt Koch, MD  LINZESS 145 MCG CAPS capsule Take 145 mcg by mouth daily as needed.  07/25/17   [provider]  Magnesium 250 MG TABS Take 250 mg by mouth daily.    [provider]  ondansetron (ZOFRAN) 4 MG tablet Take 1 tablet (4 mg total) by mouth every 8 (eight) hours as needed for nausea or vomiting. 11/12/18   Couture, Cortni S,  PA-C  ondansetron (ZOFRAN-ODT) 4 MG disintegrating tablet Take 1 tablet (4 mg total) by mouth every 8 (eight) hours as needed for nausea. 05/10/19   Melvenia Beam, MD  predniSONE (DELTASONE) 5 MG tablet Follow enclosed instruction sheet 12/09/20     rizatriptan (MAXALT-MLT) 10 MG disintegrating tablet Take 1 tablet (10 mg total) by mouth as needed for migraine. May repeat in 2 hours if needed 05/10/19   Melvenia Beam, MD  Semaglutide-Weight Management (WEGOVY) 2.4 MG/0.75ML SOAJ Inject 2.4 mg into the skin once a week. 06/24/21   Hoyt Koch, MD  Semaglutide-Weight Management 0.25 MG/0.5ML SOAJ INJECT 0.25 MG INTO THE SKIN ONCE A WEEK FOR 28 DAYS. 08/19/20 08/19/21  Hoyt Koch, MD  Semaglutide-Weight Management 0.5 MG/0.5ML SOAJ INJECT 0.5 MG INTO THE SKIN ONCE A WEEK FOR 28 DAYS. 08/19/20 08/19/21  Hoyt Koch, MD  Semaglutide-Weight Management 1 MG/0.5ML SOAJ INJECT 1 MG INTO THE SKIN ONCE A WEEK FOR 28 DAYS. 08/19/20   Hoyt Koch, MD  Semaglutide-Weight Management 1.7 MG/0.75ML SOAJ INJECT 1.7 MG INTO THE SKIN ONCE A WEEK FOR 28 DAYS. 08/19/20 08/19/21  Hoyt Koch, MD  solifenacin (VESICARE) 10 MG tablet Take 1 tablet (10 mg total) by mouth daily. 05/07/21   Hoyt Koch, MD  valACYclovir (VALTREX) 1000 MG tablet Take 1 tablet (1,000 mg total) by mouth 2 (two) times daily. 02/01/20   Hoyt Koch, MD    Family History Family History  Problem Relation Age of Onset   CAD Maternal Grandfather    Diabetes Maternal Grandfather    Hearing loss Maternal Grandfather    Heart disease Maternal Grandfather    Hyperlipidemia Maternal Grandfather    Hypertension Mother    Arthritis Mother    Arthritis Maternal Grandmother    Heart disease Maternal Grandmother    Hyperlipidemia Maternal Grandmother    Migraines Neg Hx    Headache Neg Hx     Social History Social History   Tobacco Use   Smoking status: Never   Smokeless tobacco:  Never  Vaping Use   Vaping Use: Never used  Substance Use Topics   Alcohol use: Never   Drug use: Never     Allergies   Patient has no known allergies.   Review of Systems Review of Systems  Constitutional:  Positive for appetite change, chills and fatigue. Negative for activity change, diaphoresis, fever and unexpected weight change.  HENT:  Positive for congestion and sore throat. Negative for dental problem, drooling, ear discharge, ear pain, facial swelling, hearing loss, mouth sores, nosebleeds, postnasal drip, rhinorrhea, sinus pressure, sinus pain, sneezing, tinnitus, trouble swallowing and voice change.   Respiratory:  Positive for cough. Negative for apnea, choking, chest tightness, shortness of breath, wheezing and stridor.  Cardiovascular: Negative.   Gastrointestinal:  Positive for nausea and vomiting. Negative for abdominal distention, abdominal pain, anal bleeding, blood in stool, constipation, diarrhea and rectal pain.  Neurological:  Positive for headaches. Negative for dizziness, tremors, seizures, syncope, facial asymmetry, speech difficulty, weakness, light-headedness and numbness.    Physical Exam Triage Vital Signs ED Triage Vitals  Enc Vitals Group     BP 08/14/21 0820 (!) 137/94     Pulse Rate 08/14/21 0820 98     Resp 08/14/21 0820 18     Temp 08/14/21 0820 98 F (36.7 C)     Temp Source 08/14/21 0820 Oral     SpO2 08/14/21 0820 97 %     Weight --      Height --      Head Circumference --      Peak Flow --      Pain Score 08/14/21 0822 6     Pain Loc --      Pain Edu? --      Excl. in [place]? --    No data found.  Updated Vital Signs BP (!) 137/94 (BP Location: Right Arm)    Pulse 98    Temp 98 F (36.7 C) (Oral)    Resp 18    SpO2 97%   Visual Acuity Right Eye Distance:   Left Eye Distance:   Bilateral Distance:    Right Eye Near:   Left Eye Near:    Bilateral Near:     Physical Exam Constitutional:      Appearance: Normal appearance.   HENT:     Head: Normocephalic.     Right Ear: Tympanic membrane, ear canal and external ear normal.     Left Ear: Tympanic membrane, ear canal and external ear normal.     Nose: Congestion present. No rhinorrhea.     Mouth/Throat:     Mouth: Mucous membranes are moist.     Pharynx: Oropharynx is clear.  Eyes:     Extraocular Movements: Extraocular movements intact.  Cardiovascular:     Rate and Rhythm: Normal rate and regular rhythm.     Pulses: Normal pulses.     Heart sounds: Normal heart sounds.  Pulmonary:     Effort: Pulmonary effort is normal.     Breath sounds: Normal breath sounds.  Musculoskeletal:     Cervical back: Normal range of motion and neck supple.  Skin:    General: Skin is warm and dry.  Neurological:     Mental Status: She is alert and oriented to person, place, and time. Mental status is at baseline.  Psychiatric:        Mood and Affect: Mood normal.        Behavior: Behavior normal.     UC Treatments / Results  Labs (all labs ordered are listed, but only abnormal results are displayed) Labs Reviewed - No data to display  EKG   Radiology No results found.  Procedures Procedures (including critical care time)  Medications Ordered in UC Medications - No data to display  Initial Impression / Assessment and Plan / UC Course  I have reviewed the triage vital signs and the nursing notes.  Pertinent labs & imaging results that were available during my care of the patient were reviewed by me and considered in my medical decision making (see chart for details).  Viral URI with cough  Vital signs are stable, patient is in no signs of distress, stable for outpatient treatment, Toradol injection given  in office to minimize headache, will continue use of over-the-counter medications for management at home, endorses that she has prescription for migraine medication and will use as needed, flu test was negative, COVID test is pending, due to rheumatoid  arthritis patient qualifies for antiviral treatment, discussed administration and use with patient, will prescribe if positive, over-the-counter medications for remaining symptom management, urgent care follow-up as needed Final Clinical Impressions(s) / UC Diagnoses   Final diagnoses:  None   Discharge Instructions   None    ED Prescriptions   None    PDMP not reviewed this encounter.   Hans Eden, [place] 08/14/21 [phone]

## 2021-08-14 NOTE — ED Notes (Signed)
Discharge instructions reviewed with patient by this nurse

## 2021-08-14 NOTE — ED Triage Notes (Signed)
Pt presents with headache, nausea, fatigue, and sore throat X 3 days with a negative covid at home test X 2 days ago.

## 2021-08-19 ENCOUNTER — Encounter: Payer: Self-pay | Admitting: Internal Medicine

## 2021-08-19 ENCOUNTER — Other Ambulatory Visit (HOSPITAL_COMMUNITY): Payer: Self-pay

## 2021-08-19 ENCOUNTER — Ambulatory Visit (INDEPENDENT_AMBULATORY_CARE_PROVIDER_SITE_OTHER): Payer: No Typology Code available for payment source | Admitting: Internal Medicine

## 2021-08-19 ENCOUNTER — Other Ambulatory Visit: Payer: Self-pay

## 2021-08-19 VITALS — BP 122/70 | HR 71 | Resp 18 | Ht 67.0 in | Wt 190.2 lb

## 2021-08-19 DIAGNOSIS — Z0000 Encounter for general adult medical examination without abnormal findings: Secondary | ICD-10-CM

## 2021-08-19 DIAGNOSIS — J3089 Other allergic rhinitis: Secondary | ICD-10-CM

## 2021-08-19 DIAGNOSIS — I10 Essential (primary) hypertension: Secondary | ICD-10-CM | POA: Diagnosis not present

## 2021-08-19 DIAGNOSIS — E039 Hypothyroidism, unspecified: Secondary | ICD-10-CM | POA: Diagnosis not present

## 2021-08-19 DIAGNOSIS — M0600 Rheumatoid arthritis without rheumatoid factor, unspecified site: Secondary | ICD-10-CM

## 2021-08-19 DIAGNOSIS — E663 Overweight: Secondary | ICD-10-CM

## 2021-08-19 LAB — COMPREHENSIVE METABOLIC PANEL
ALT: 7 U/L (ref 0–35)
AST: 12 U/L (ref 0–37)
Albumin: 4.2 g/dL (ref 3.5–5.2)
Alkaline Phosphatase: 42 U/L (ref 39–117)
BUN: 11 mg/dL (ref 6–23)
CO2: 27 mEq/L (ref 19–32)
Calcium: 8.9 mg/dL (ref 8.4–10.5)
Chloride: 103 mEq/L (ref 96–112)
Creatinine, Ser: 0.77 mg/dL (ref 0.40–1.20)
GFR: 92.18 mL/min (ref >60.00)
Glucose, Bld: 81 mg/dL (ref 70–99)
Potassium: 3.9 mEq/L (ref 3.5–5.1)
Sodium: 136 mEq/L (ref 135–145)
Total Bilirubin: 0.4 mg/dL (ref 0.2–1.2)
Total Protein: 7.3 g/dL (ref 6.0–8.3)

## 2021-08-19 LAB — TSH: TSH: 0.91 u[IU]/mL (ref 0.35–5.50)

## 2021-08-19 LAB — CBC
HCT: 34.8 % — ABNORMAL LOW (ref 36.0–46.0)
Hemoglobin: 10.8 g/dL — ABNORMAL LOW (ref 12.0–15.0)
MCHC: 31.1 g/dL (ref 30.0–36.0)
MCV: 80 fl (ref 78.0–100.0)
Platelets: 215 10*3/uL (ref 150.0–400.0)
RBC: 4.34 Mil/uL (ref 3.87–5.11)
RDW: 14.3 % (ref 11.5–15.5)
WBC: 7.2 10*3/uL (ref 4.0–10.5)

## 2021-08-19 LAB — LIPID PANEL
Cholesterol: 171 mg/dL (ref 0–200)
HDL: 33.2 mg/dL — ABNORMAL LOW (ref >39.00)
LDL Cholesterol: 121 mg/dL — ABNORMAL HIGH (ref 0–99)
NonHDL: 138.09
Total CHOL/HDL Ratio: 5
Triglycerides: 87 mg/dL (ref 0.0–149.0)
VLDL: 17.4 mg/dL (ref 0.0–40.0)

## 2021-08-19 LAB — HEMOGLOBIN A1C: Hgb A1c MFr Bld: 5.5 % (ref 4.6–6.5)

## 2021-08-19 LAB — VITAMIN B12: Vitamin B-12: 232 pg/mL (ref 211–911)

## 2021-08-19 MED ORDER — SEMAGLUTIDE-WEIGHT MANAGEMENT 0.5 MG/0.5ML ~~LOC~~ SOAJ
0.5000 mg | SUBCUTANEOUS | 11 refills | Status: DC
  Filled 2021-08-19 – 2021-11-10 (×12): qty 2, 28d supply, fill #0
  Filled 2021-12-04: qty 2, 28d supply, fill #1

## 2021-08-19 NOTE — Patient Instructions (Signed)
We will cut back to the 0.5 mg wegovy.  Okay to stop amlodipine. Check the BP 1-2 times a week for the first month after stopping. The goal is <140/<90.

## 2021-08-19 NOTE — Progress Notes (Signed)
° °  Subjective:   Patient ID: Maria Wiley, female    DOB: 20-Feb-1976, 46 y.o.   MRN: [id number]  HPI The patient is a 46 YO female coming in for physical.   PMH, FMH, social history reviewed and updated  Review of Systems  Constitutional: Negative.   HENT: Negative.    Eyes: Negative.   Respiratory:  Negative for cough, chest tightness and shortness of breath.   Cardiovascular:  Negative for chest pain, palpitations and leg swelling.  Gastrointestinal:  Negative for abdominal distention, abdominal pain, constipation, diarrhea, nausea and vomiting.  Musculoskeletal: Negative.   Skin: Negative.   Neurological: Negative.   Psychiatric/Behavioral: Negative.     Objective:  Physical Exam Constitutional:      Appearance: She is well-developed.  HENT:     Head: Normocephalic and atraumatic.  Cardiovascular:     Rate and Rhythm: Normal rate and regular rhythm.  Pulmonary:     Effort: Pulmonary effort is normal. No respiratory distress.     Breath sounds: Normal breath sounds. No wheezing or rales.  Abdominal:     General: Bowel sounds are normal. There is no distension.     Palpations: Abdomen is soft.     Tenderness: There is no abdominal tenderness. There is no rebound.  Musculoskeletal:     Cervical back: Normal range of motion.  Skin:    General: Skin is warm and dry.  Neurological:     Mental Status: She is alert and oriented to person, place, and time.     Coordination: Coordination normal.    Vitals:   08/19/21 1506  BP: 122/70  Pulse: 71  Resp: 18  SpO2: 97%  Weight: 190 lb 3.2 oz (86.3 kg)  Height:  (1.702 m)    This visit occurred during the SARS-CoV-2 public health emergency.  Safety protocols were in place, including screening questions prior to the visit, additional usage of staff PPE, and extensive cleaning of exam room while observing appropriate contact time as indicated for disinfecting solutions.   Assessment & Plan:

## 2021-08-20 DIAGNOSIS — E663 Overweight: Secondary | ICD-10-CM | POA: Insufficient documentation

## 2021-08-20 DIAGNOSIS — E669 Obesity, unspecified: Secondary | ICD-10-CM | POA: Insufficient documentation

## 2021-08-20 NOTE — Assessment & Plan Note (Signed)
Marked weight loss with wegovy. She is currently taking 2.4 mg weekly and would like to cut down. Rx 0.5 mg weekly wegovy for maintenance as she feels she has not made needed lifestyle change to maintain on her own now. We can stop or adjust dose in future to sustain weight loss as needed.

## 2021-08-20 NOTE — Assessment & Plan Note (Signed)
Checking TSH and adjust synthroid 25 mcg daily as needed.  ?

## 2021-08-20 NOTE — Assessment & Plan Note (Signed)
Still has flares of this from time to time. Taking enbrel and arava and off prednisone. Checking CMP and CBC today.

## 2021-08-20 NOTE — Assessment & Plan Note (Signed)
Flu shot up to date. Covid-19 booster recommended. Pneumonia up to date. Tetanus up to date. Colonoscopy up to date due at 49. Mammogram up to date with gyn, pap smear up to date with gyn. Counseled about sun safety and mole surveillance. Counseled about the dangers of distracted driving. Given 10 year screening recommendations.

## 2021-08-20 NOTE — Assessment & Plan Note (Signed)
BP at goal on amlodipine 5 mg daily. With weight loss of 50 pounds from last year she can try stopping if desired. Monitor BP 2-3 times weekly for 1-2 months and let us know if >140/90. Checking CMP today.

## 2021-08-20 NOTE — Assessment & Plan Note (Signed)
Would like referral to ENT as prior retired and had only 1 visit which was not helpful.

## 2021-08-21 ENCOUNTER — Other Ambulatory Visit (HOSPITAL_COMMUNITY): Payer: Self-pay

## 2021-08-22 ENCOUNTER — Other Ambulatory Visit (HOSPITAL_COMMUNITY): Payer: Self-pay

## 2021-08-27 ENCOUNTER — Ambulatory Visit
Admission: EM | Admit: 2021-08-27 | Discharge: 2021-08-27 | Disposition: A | Discharge status: Home / Self Care | Payer: No Typology Code available for payment source | Attending: Urgent Care | Admitting: Urgent Care

## 2021-08-27 ENCOUNTER — Other Ambulatory Visit (HOSPITAL_COMMUNITY): Payer: Self-pay

## 2021-08-27 ENCOUNTER — Encounter: Payer: Self-pay | Admitting: Emergency Medicine

## 2021-08-27 ENCOUNTER — Other Ambulatory Visit: Payer: Self-pay

## 2021-08-27 DIAGNOSIS — H9201 Otalgia, right ear: Secondary | ICD-10-CM | POA: Diagnosis not present

## 2021-08-27 DIAGNOSIS — R0982 Postnasal drip: Secondary | ICD-10-CM | POA: Diagnosis not present

## 2021-08-27 DIAGNOSIS — J018 Other acute sinusitis: Secondary | ICD-10-CM | POA: Diagnosis not present

## 2021-08-27 LAB — POCT RAPID STREP A (OFFICE): Rapid Strep A Screen: NEGATIVE

## 2021-08-27 MED ORDER — PSEUDOEPHEDRINE HCL 60 MG PO TABS
60.0000 mg | ORAL_TABLET | Freq: Three times a day (TID) | ORAL | 0 refills | Status: DC | PRN
  Filled 2021-08-27: qty 30, 10d supply, fill #0

## 2021-08-27 MED ORDER — CETIRIZINE HCL 10 MG PO TABS
10.0000 mg | ORAL_TABLET | Freq: Every day | ORAL | 0 refills | Status: DC
  Filled 2021-08-27 – 2022-07-01 (×8): qty 30, 30d supply, fill #0

## 2021-08-27 MED ORDER — AMOXICILLIN-POT CLAVULANATE 875-125 MG PO TABS
1.0000 | ORAL_TABLET | Freq: Two times a day (BID) | ORAL | 0 refills | Status: DC
  Filled 2021-08-27: qty 14, 7d supply, fill #0

## 2021-08-27 NOTE — ED Provider Notes (Addendum)
Maria Wiley   MRN: [id number] DOB: 29-Jul-1976  Subjective:   Maria Wiley is a 46 y.o. female presenting for 3 day history of recurrent sinus drainage, right ear pain, throat congestion worse at night and in the morning. Initially had cold symptoms for more than a week and had a few days of improvement until there current symptoms. No coughing, chest pain, shob. Has a history of allergic rhinitis but does not take anything consistently for this. Of note, she was seen 08/14/2021 and started on supportive care for a viral URI.   No current facility-administered medications for this encounter.  Current Outpatient Medications:    amoxicillin-clavulanate (AUGMENTIN) 875-125 MG tablet, Take 1 tablet by mouth 2 (two) times daily., Disp: 14 tablet, Rfl: 0   cetirizine (ZYRTEC ALLERGY) 10 MG tablet, Take 1 tablet (10 mg total) by mouth daily., Disp: 30 tablet, Rfl: 0   pseudoephedrine (SUDAFED) 60 MG tablet, Take 1 tablet (60 mg total) by mouth every 8 (eight) hours as needed for congestion., Disp: 30 tablet, Rfl: 0   amLODipine (NORVASC) 5 MG tablet, Take 1 tablet (5 mg total) by mouth daily., Disp: 90 tablet, Rfl: 0   cholecalciferol (VITAMIN D) 1000 units tablet, Take 1,000 Units by mouth daily., Disp: , Rfl:    diclofenac (VOLTAREN) 75 MG EC tablet, Take 1 tablet (75 mg total) by mouth 2 (two) times daily for joint/back pain., Disp: 60 tablet, Rfl: 2   escitalopram (LEXAPRO) 20 MG tablet, Take 1 tablet (20 mg total) by mouth daily., Disp: 90 tablet, Rfl: 0   etanercept (ENBREL SURECLICK) 50 MG/ML injection, INJECT 1 ML UNDER THE SKIN EACH WEEK AS DIRECTED., Disp: 4 mL, Rfl: 1   folic acid (FOLVITE) 1 MG tablet, Take 1 tablet (1 mg total) by mouth daily., Disp: 90 tablet, Rfl: 3   leflunomide (ARAVA) 20 MG tablet, TAKE 1 TABLET BY MOUTH ONCE A DAY, Disp: 30 tablet, Rfl: 0   leflunomide (ARAVA) 20 MG tablet, TAKE 1 TABLET BY MOUTH ONCE A DAY, Disp: 90 tablet, Rfl: 0   leflunomide  (ARAVA) 20 MG tablet, Take 1 tablet (20 mg total) by mouth daily., Disp: 30 tablet, Rfl: 0   leflunomide (ARAVA) 20 MG tablet, Take 1 tablet (20 mg total) by mouth daily., Disp: 90 tablet, Rfl: 1   levothyroxine (SYNTHROID) 25 MCG tablet, Take 1 tablet (25 mcg total) by mouth daily before breakfast., Disp: 90 tablet, Rfl: 2   LINZESS 145 MCG CAPS capsule, Take 145 mcg by mouth daily as needed. , Disp: , Rfl:    Magnesium 250 MG TABS, Take 250 mg by mouth daily., Disp: , Rfl:    ondansetron (ZOFRAN) 4 MG tablet, Take 1 tablet (4 mg total) by mouth every 8 (eight) hours as needed for nausea or vomiting., Disp: 6 tablet, Rfl: 0   ondansetron (ZOFRAN-ODT) 4 MG disintegrating tablet, Take 1 tablet (4 mg total) by mouth every 8 (eight) hours as needed for nausea., Disp: 30 tablet, Rfl: 3   predniSONE (DELTASONE) 5 MG tablet, Follow enclosed instruction sheet, Disp: 48 tablet, Rfl: 0   rizatriptan (MAXALT-MLT) 10 MG disintegrating tablet, Take 1 tablet (10 mg total) by mouth as needed for migraine. May repeat in 2 hours if needed, Disp: 9 tablet, Rfl: 11   Semaglutide-Weight Management 0.5 MG/0.5ML SOAJ, Inject 0.5 mg into the skin once a week., Disp: 2 mL, Rfl: 11   solifenacin (VESICARE) 10 MG tablet, Take 1 tablet (10 mg total) by mouth daily.,  Disp: 90 tablet, Rfl: 0   valACYclovir (VALTREX) 1000 MG tablet, Take 1 tablet (1,000 mg total) by mouth 2 (two) times daily., Disp: 60 tablet, Rfl: 3   No Known Allergies  Past Medical History:  Diagnosis Date   Ankylosing spondylitis (HCC)    Chest pain    COVID-19 06/2020   GERD (gastroesophageal reflux disease)    Hypertension    Migraines    RA (rheumatoid arthritis) (HCC)    Thyroid disease    Urinary incontinence      Past Surgical History:  Procedure Laterality Date   ABDOMINAL HYSTERECTOMY     APPENDECTOMY     CHOLECYSTECTOMY     HERNIA REPAIR  2004   KNEE SURGERY Right 2006    Family History  Problem Relation Age of Onset   CAD  Maternal Grandfather    Diabetes Maternal Grandfather    Hearing loss Maternal Grandfather    Heart disease Maternal Grandfather    Hyperlipidemia Maternal Grandfather    Hypertension Mother    Arthritis Mother    Arthritis Maternal Grandmother    Heart disease Maternal Grandmother    Hyperlipidemia Maternal Grandmother    Migraines Neg Hx    Headache Neg Hx     Social History   Tobacco Use   Smoking status: Never   Smokeless tobacco: Never  Vaping Use   Vaping Use: Never used  Substance Use Topics   Alcohol use: Never   Drug use: Never    ROS   Objective:   Vitals: BP 123/88 (BP Location: Left Arm)    Pulse 82    Temp 98.9 F (37.2 C) (Oral)    Resp 18    SpO2 97%   Physical Exam Constitutional:      General: She is not in acute distress.    Appearance: Normal appearance. She is well-developed and normal weight. She is not ill-appearing, toxic-appearing or diaphoretic.  HENT:     Head: Normocephalic and atraumatic.     Right Ear: Tympanic membrane, ear canal and external ear normal. No drainage or tenderness. No middle ear effusion. There is no impacted cerumen. Tympanic membrane is not erythematous.     Left Ear: Tympanic membrane, ear canal and external ear normal. No drainage or tenderness.  No middle ear effusion. There is no impacted cerumen. Tympanic membrane is not erythematous.     Nose: Congestion present. No rhinorrhea.     Mouth/Throat:     Mouth: Mucous membranes are moist. No oral lesions.     Pharynx: No pharyngeal swelling, oropharyngeal exudate, posterior oropharyngeal erythema or uvula swelling.     Tonsils: No tonsillar exudate or tonsillar abscesses. 0 on the right. 0 on the left.     Comments: Thick streaks of post-nasal drainage overlying pharynx.  Eyes:     General: No scleral icterus.       Right eye: No discharge.        Left eye: No discharge.     Extraocular Movements: Extraocular movements intact.     Right eye: Normal extraocular  motion.     Left eye: Normal extraocular motion.     Conjunctiva/sclera: Conjunctivae normal.  Cardiovascular:     Rate and Rhythm: Normal rate.  Pulmonary:     Effort: Pulmonary effort is normal.  Musculoskeletal:     Cervical back: Normal range of motion and neck supple.  Lymphadenopathy:     Cervical: No cervical adenopathy.  Skin:    General: Skin is warm  and dry.  Neurological:     General: No focal deficit present.     Mental Status: She is alert and oriented to person, place, and time.  Psychiatric:        Mood and Affect: Mood normal.        Behavior: Behavior normal.    Results for orders placed or performed during the hospital encounter of 08/27/21 (from the past 24 hour(s))  POCT rapid strep A     Status: None   Collection Time: 08/27/21  1:39 PM  Result Value Ref Range   Rapid Strep A Screen Negative Negative    Assessment and Plan :   PDMP not reviewed this encounter.  1. Acute non-recurrent sinusitis of other sinus   2. Right ear pain   3. Post-nasal drainage     Will start empiric treatment for sinusitis with Augmentin.  Recommended supportive care otherwise including the use of oral antihistamine, decongestant. Counseled patient on potential for adverse effects with medications prescribed/recommended today, ER and return-to-clinic precautions discussed, patient verbalized understanding.     Wallis Bamberg, PA-C 08/27/21 1345

## 2021-08-27 NOTE — ED Triage Notes (Signed)
Pt presents with very sore throat and post-nasal trip for 3 days following a cold over a week ago. Pt states the sore throat is much worse in the morning. Denies fevers.

## 2021-08-29 ENCOUNTER — Other Ambulatory Visit (HOSPITAL_COMMUNITY): Payer: Self-pay

## 2021-09-01 ENCOUNTER — Other Ambulatory Visit (HOSPITAL_COMMUNITY): Payer: Self-pay

## 2021-09-04 ENCOUNTER — Other Ambulatory Visit (HOSPITAL_COMMUNITY): Payer: Self-pay

## 2021-09-17 ENCOUNTER — Encounter: Payer: Self-pay | Admitting: Internal Medicine

## 2021-09-17 ENCOUNTER — Other Ambulatory Visit (HOSPITAL_COMMUNITY): Payer: Self-pay

## 2021-09-22 ENCOUNTER — Other Ambulatory Visit: Payer: Self-pay

## 2021-09-22 ENCOUNTER — Ambulatory Visit: Payer: No Typology Code available for payment source | Attending: Internal Medicine | Admitting: Pharmacist

## 2021-09-22 ENCOUNTER — Ambulatory Visit
Admission: EM | Admit: 2021-09-22 | Discharge: 2021-09-22 | Disposition: A | Discharge status: Home / Self Care | Payer: No Typology Code available for payment source

## 2021-09-22 DIAGNOSIS — Z79899 Other long term (current) drug therapy: Secondary | ICD-10-CM

## 2021-09-22 NOTE — Progress Notes (Signed)
° °  S: Patient presents for review of their specialty medication therapy.  Patient is currently taking Enbrel for rheumatoid arthritis. Patient is managed by Azucena Fallen for this.   Adherence: denies any missed doses  Efficacy: working well for her, has been on for a while.  Dosing: 50 mg daily  Ankylosing spondylitis, psoriatic arthritis, rheumatoid arthritis: SubQ: Note: May continue methotrexate, glucocorticoids, salicylates, NSAIDs, or analgesics during etanercept therapy. Once-weekly dosing: 50 mg once weekly; maximum dose (rheumatoid arthritis): 50 mg/week. Twice-weekly dosing (off-label dose): 25 mg twice weekly (Bathon 2000; Calin 2004; Earlene Plater 2003; Modesto Charon 2002; Mease 2000; Mease 2002)   Screening: TB test: completed per patient Hepatitis B: completed per patient  Monitoring: Injection site reactions: denies  S/sx of infections: denies S/sx of malignancy: denies GI upset: denies  O:    Lab Results  Component Value Date   WBC 7.2 08/19/2021   HGB 10.8 (L) 08/19/2021   HCT 34.8 (L) 08/19/2021   MCV 80.0 08/19/2021   PLT 215.0 08/19/2021      Chemistry      Component Value Date/Time   NA 136 08/19/2021 1536   K 3.9 08/19/2021 1536   CL 103 08/19/2021 1536   CO2 27 08/19/2021 1536   BUN 11 08/19/2021 1536   CREATININE 0.77 08/19/2021 1536      Component Value Date/Time   CALCIUM 8.9 08/19/2021 1536   ALKPHOS 42 08/19/2021 1536   AST 12 08/19/2021 1536   ALT 7 08/19/2021 1536   BILITOT 0.4 08/19/2021 1536       A/P: 1. Medication review: patient currently on Enbrel for rheumatoid arthritis and is tolerating it well. Reviewed the medication with the patient, including the following: Enbrel (etanercept) binds tumor necrosis factor (TNF) and blocks its interaction with cell surface receptors. TNF plays an important role in the inflammatory processes of many diseases. Patient educated on purpose, proper use and potential adverse effects of Enbrel. Adverse  effects include rash, GI upset, increased risk of infection, and injection site reactions. Patients should stop Enbrel if they develop a serious infection. There is a possible increased risk in lymphoma and other malignancies. No recommendations for any changes.   Butch Penny, PharmD, Patsy [place], CPP Clinical Pharmacist [phone]

## 2021-09-23 ENCOUNTER — Other Ambulatory Visit (HOSPITAL_COMMUNITY): Payer: Self-pay

## 2021-09-26 ENCOUNTER — Ambulatory Visit (HOSPITAL_COMMUNITY)
Admission: EM | Admit: 2021-09-26 | Discharge: 2021-09-26 | Discharge status: Absent without leave | Payer: No Typology Code available for payment source

## 2021-09-26 ENCOUNTER — Ambulatory Visit (INDEPENDENT_AMBULATORY_CARE_PROVIDER_SITE_OTHER): Payer: No Typology Code available for payment source | Admitting: Internal Medicine

## 2021-09-26 ENCOUNTER — Other Ambulatory Visit (HOSPITAL_COMMUNITY): Payer: Self-pay

## 2021-09-26 ENCOUNTER — Encounter: Payer: Self-pay | Admitting: Internal Medicine

## 2021-09-26 ENCOUNTER — Other Ambulatory Visit: Payer: Self-pay

## 2021-09-26 DIAGNOSIS — H547 Unspecified visual loss: Secondary | ICD-10-CM

## 2021-09-26 DIAGNOSIS — I10 Essential (primary) hypertension: Secondary | ICD-10-CM | POA: Diagnosis not present

## 2021-09-26 DIAGNOSIS — R42 Dizziness and giddiness: Secondary | ICD-10-CM | POA: Insufficient documentation

## 2021-09-26 DIAGNOSIS — E538 Deficiency of other specified B group vitamins: Secondary | ICD-10-CM | POA: Diagnosis not present

## 2021-09-26 LAB — BASIC METABOLIC PANEL
BUN: 13 mg/dL (ref 6–23)
CO2: 31 mEq/L (ref 19–32)
Calcium: 8.8 mg/dL (ref 8.4–10.5)
Chloride: 103 mEq/L (ref 96–112)
Creatinine, Ser: 0.8 mg/dL (ref 0.40–1.20)
GFR: 87.98 mL/min (ref >60.00)
Glucose, Bld: 85 mg/dL (ref 70–99)
Potassium: 3.9 mEq/L (ref 3.5–5.1)
Sodium: 136 mEq/L (ref 135–145)

## 2021-09-26 LAB — CBC WITH DIFFERENTIAL/PLATELET
Basophils Absolute: 0 10*3/uL (ref 0.0–0.1)
Basophils Relative: 0.6 % (ref 0.0–3.0)
Eosinophils Absolute: 0.2 10*3/uL (ref 0.0–0.7)
Eosinophils Relative: 2.6 % (ref 0.0–5.0)
HCT: 32 % — ABNORMAL LOW (ref 36.0–46.0)
Hemoglobin: 10.2 g/dL — ABNORMAL LOW (ref 12.0–15.0)
Lymphocytes Relative: 37 % (ref 12.0–46.0)
Lymphs Abs: 2.6 10*3/uL (ref 0.7–4.0)
MCHC: 31.8 g/dL (ref 30.0–36.0)
MCV: 79.8 fl (ref 78.0–100.0)
Monocytes Absolute: 0.7 10*3/uL (ref 0.1–1.0)
Monocytes Relative: 9.4 % (ref 3.0–12.0)
Neutro Abs: 3.6 10*3/uL (ref 1.4–7.7)
Neutrophils Relative %: 50.4 % (ref 43.0–77.0)
Platelets: 242 10*3/uL (ref 150.0–400.0)
RBC: 4.01 Mil/uL (ref 3.87–5.11)
RDW: 14.5 % (ref 11.5–15.5)
WBC: 7.1 10*3/uL (ref 4.0–10.5)

## 2021-09-26 LAB — SEDIMENTATION RATE: Sed Rate: 22 mm/hr — ABNORMAL HIGH (ref 0–20)

## 2021-09-26 LAB — C-REACTIVE PROTEIN: CRP: <1 mg/dL (ref 0.5–20.0)

## 2021-09-26 NOTE — Progress Notes (Signed)
Patient ID: Maria Wiley, female   DOB: 03/07/1976, 46 y.o.   MRN: [id number]        Chief Complaint: follow up dizziness with blurred vision, low b12       HPI:  Maria Wiley is a 46 y.o. female here with c/o 1 wk new onset unusual dizziness with trying to fall to the left with walking, vision blurriness and once complete vision loss with driving, but was able to stop the car.  Has been mild intemittent, worse again overall last Saturday, then this am again for several hours.  Pt denies chest pain, increased sob or doe, wheezing, orthopnea, PND, increased LE swelling, palpitations, dizziness or syncope.   Pt denies polydipsia, polyuria, or other new focal neuro s/s.  Has just started taking B12.  Has optho and ENT appt for next mo but cant be seen before that it seems.   Pt denies fever, wt loss, night sweats, loss of appetite, or other constitutional symptoms        Wt Readings from Last 3 Encounters:  09/26/21 191 lb 12.8 oz (87 kg)  08/19/21 190 lb 3.2 oz (86.3 kg)  02/07/21 214 lb 3.2 oz (97.2 kg)   BP Readings from Last 3 Encounters:  09/26/21 126/72  08/27/21 123/88  08/19/21 122/70         Past Medical History:  Diagnosis Date   Ankylosing spondylitis ([place])    Chest pain    COVID-19 06/2020   GERD (gastroesophageal reflux disease)    Hypertension    Migraines    RA (rheumatoid arthritis) ([place])    Thyroid disease    Urinary incontinence    Past Surgical History:  Procedure Laterality Date   ABDOMINAL HYSTERECTOMY     APPENDECTOMY     CHOLECYSTECTOMY     HERNIA REPAIR  2004   KNEE SURGERY Right 2006    reports that she has never smoked. She has never used smokeless tobacco. She reports that she does not drink alcohol and does not use drugs. family history includes Arthritis in her maternal grandmother and mother; CAD in her maternal grandfather; Diabetes in her maternal grandfather; Hearing loss in her maternal grandfather; Heart disease in her maternal grandfather  and maternal grandmother; Hyperlipidemia in her maternal grandfather and maternal grandmother; Hypertension in her mother. No Known Allergies Current Outpatient Medications on File Prior to Visit  Medication Sig Dispense Refill   amLODipine (NORVASC) 5 MG tablet Take 1 tablet (5 mg total) by mouth daily. 90 tablet 0   cetirizine (ZYRTEC ALLERGY) 10 MG tablet Take 1 tablet (10 mg total) by mouth daily. 30 tablet 0   cholecalciferol (VITAMIN D) 1000 units tablet Take 1,000 Units by mouth daily.     diclofenac (VOLTAREN) 75 MG EC tablet Take 1 tablet (75 mg total) by mouth 2 (two) times daily for joint/back pain. 60 tablet 2   escitalopram (LEXAPRO) 20 MG tablet Take 1 tablet (20 mg total) by mouth daily. 90 tablet 0   etanercept (ENBREL SURECLICK) 50 MG/ML injection INJECT 1 ML UNDER THE SKIN EACH WEEK AS DIRECTED. 4 mL 1   folic acid (FOLVITE) 1 MG tablet Take 1 tablet (1 mg total) by mouth daily. 90 tablet 3   leflunomide (ARAVA) 20 MG tablet TAKE 1 TABLET BY MOUTH ONCE A DAY 30 tablet 0   leflunomide (ARAVA) 20 MG tablet Take 1 tablet (20 mg total) by mouth daily. 30 tablet 0   leflunomide (ARAVA) 20 MG tablet Take  1 tablet (20 mg total) by mouth daily. 90 tablet 1   levothyroxine (SYNTHROID) 25 MCG tablet Take 1 tablet (25 mcg total) by mouth daily before breakfast. 90 tablet 2   LINZESS 145 MCG CAPS capsule Take 145 mcg by mouth daily as needed.      Magnesium 250 MG TABS Take 250 mg by mouth daily.     ondansetron (ZOFRAN) 4 MG tablet Take 1 tablet (4 mg total) by mouth every 8 (eight) hours as needed for nausea or vomiting. 6 tablet 0   ondansetron (ZOFRAN-ODT) 4 MG disintegrating tablet Take 1 tablet (4 mg total) by mouth every 8 (eight) hours as needed for nausea. 30 tablet 3   pseudoephedrine (SUDAFED) 60 MG tablet Take 1 tablet (60 mg total) by mouth every 8 (eight) hours as needed for congestion. 30 tablet 0   rizatriptan (MAXALT-MLT) 10 MG disintegrating tablet Take 1 tablet (10 mg  total) by mouth as needed for migraine. May repeat in 2 hours if needed 9 tablet 11   Semaglutide-Weight Management 0.5 MG/0.5ML SOAJ Inject 0.5 mg into the skin once a week. 2 mL 11   solifenacin (VESICARE) 10 MG tablet Take 1 tablet (10 mg total) by mouth daily. 90 tablet 0   valACYclovir (VALTREX) 1000 MG tablet Take 1 tablet (1,000 mg total) by mouth 2 (two) times daily. 60 tablet 3   leflunomide (ARAVA) 20 MG tablet TAKE 1 TABLET BY MOUTH ONCE A DAY 90 tablet 0   No current facility-administered medications on file prior to visit.        ROS:  All others reviewed and negative.  Objective        PE:  BP 126/72 (BP Location: Right Arm, Patient Position: Sitting, Cuff Size: Large)    Pulse 77    Temp 98.5 F (36.9 C) (Oral)    Ht 5\' 7"  (1.702 m)    Wt 191 lb 12.8 oz (87 kg)    SpO2 98%    BMI 30.04 kg/m                 Constitutional: Pt appears in NAD               HENT: Head: NCAT.                Right Ear: External ear normal.                 Left Ear: External ear normal.                Eyes: . Pupils are equal, round, and reactive to light. Conjunctivae and EOM are normal               Nose: without d/c or deformity               Neck: Neck supple. Gross normal ROM               Cardiovascular: Normal rate and regular rhythm.                 Pulmonary/Chest: Effort normal and breath sounds without rales or wheezing.                Abd:  Soft, NT, ND, + BS, no organomegaly               Neurological: Pt is alert. At baseline orientation, motor grossly intact, cn 2-12 intact  Skin: Skin is warm. No rashes, no other new lesions, LE edema - none               Psychiatric: Pt behavior is normal without agitation   Micro: none  Cardiac tracings I have personally interpreted today:  none  Pertinent Radiological findings (summarize): none   Lab Results  Component Value Date   WBC 7.1 09/26/2021   HGB 10.2 (L) 09/26/2021   HCT 32.0 (L) 09/26/2021   PLT 242.0  09/26/2021   GLUCOSE 85 09/26/2021   CHOL 171 08/19/2021   TRIG 87.0 08/19/2021   HDL 33.20 (L) 08/19/2021   LDLCALC 121 (H) 08/19/2021   ALT 7 08/19/2021   AST 12 08/19/2021   NA 136 09/26/2021   K 3.9 09/26/2021   CL 103 09/26/2021   CREATININE 0.80 09/26/2021   BUN 13 09/26/2021   CO2 31 09/26/2021   TSH 0.91 08/19/2021   HGBA1C 5.5 08/19/2021   Assessment/Plan:  Maria Wiley is a 46 y.o. Black or African American [2] female with  has a past medical history of Ankylosing spondylitis ([place]), Chest pain, COVID-19 (06/2020), GERD (gastroesophageal reflux disease), Hypertension, Migraines, RA (rheumatoid arthritis) ([place]), Thyroid disease, and Urinary incontinence.  B12 deficiency Lab Results  Component Value Date   VITAMINB12 232 08/19/2021   To continue the recent start oral replacement - b12 1000 mcg qd   Dizziness Etiology, exam benign, but for stat MRI brain, labs as ordered, neurology referral  Vision loss For eval as above, but also f/u optho as planned  Essential hypertension BP Readings from Last 3 Encounters:  09/26/21 126/72  08/27/21 123/88  08/19/21 122/70   Stable, pt to continue medical treatment amlodipine  Followup: Return if symptoms worsen or fail to improve.  Cathlean Cower, MD 09/27/2021 2:27 PM [facility]

## 2021-09-26 NOTE — Patient Instructions (Signed)
Please continue all other medications as before, including the B12  Please have the pharmacy call with any other refills you may need.  Please continue your efforts at being more active, low cholesterol diet, and weight control.  Please keep your appointments with your specialists as you may have planned  You will be contacted regarding the referral for: MRI (urgent), and neurology  Please go to the LAB at the blood drawing area for the tests to be done  You will be contacted by phone if any changes need to be made immediately.  Otherwise, you will receive a letter about your results with an explanation, but please check with MyChart first.  Please remember to sign up for MyChart if you have not done so, as this will be important to you in the future with finding out test results, communicating by private email, and scheduling acute appointments online when needed.  Please see Dr Sharlet Salina in 1-2 weeks    .

## 2021-09-27 ENCOUNTER — Encounter: Payer: Self-pay | Admitting: Internal Medicine

## 2021-09-27 NOTE — Assessment & Plan Note (Signed)
Lab Results  Component Value Date   [id number] 08/19/2021   To continue the recent start oral replacement - b12 1000 mcg qd

## 2021-09-27 NOTE — Assessment & Plan Note (Signed)
For eval as above, but also f/u optho as planned

## 2021-09-27 NOTE — Assessment & Plan Note (Signed)
Etiology, exam benign, but for stat MRI brain, labs as ordered, neurology referral

## 2021-09-27 NOTE — Assessment & Plan Note (Signed)
BP Readings from Last 3 Encounters:  09/26/21 126/72  08/27/21 123/88  08/19/21 122/70   Stable, pt to continue medical treatment amlodipine

## 2021-09-28 ENCOUNTER — Other Ambulatory Visit: Payer: No Typology Code available for payment source

## 2021-09-29 ENCOUNTER — Ambulatory Visit (HOSPITAL_COMMUNITY)
Admission: EM | Admit: 2021-09-29 | Discharge: 2021-09-29 | Disposition: A | Discharge status: Home / Self Care | Payer: No Typology Code available for payment source

## 2021-09-29 ENCOUNTER — Other Ambulatory Visit: Payer: Self-pay

## 2021-09-29 ENCOUNTER — Encounter: Payer: Self-pay | Admitting: Internal Medicine

## 2021-09-30 ENCOUNTER — Other Ambulatory Visit: Payer: Self-pay | Admitting: Pharmacist

## 2021-09-30 ENCOUNTER — Ambulatory Visit
Admission: RE | Admit: 2021-09-30 | Discharge: 2021-09-30 | Disposition: A | Discharge status: Home / Self Care | Payer: No Typology Code available for payment source | Source: Ambulatory Visit | Attending: Internal Medicine | Admitting: Internal Medicine

## 2021-09-30 ENCOUNTER — Other Ambulatory Visit (HOSPITAL_COMMUNITY): Payer: Self-pay

## 2021-09-30 DIAGNOSIS — R42 Dizziness and giddiness: Secondary | ICD-10-CM

## 2021-09-30 DIAGNOSIS — H547 Unspecified visual loss: Secondary | ICD-10-CM

## 2021-09-30 MED ORDER — ENBREL SURECLICK 50 MG/ML ~~LOC~~ SOAJ
SUBCUTANEOUS | 2 refills | Status: DC
  Filled 2021-09-30: qty 4, 28d supply, fill #0
  Filled 2021-10-30: qty 4, 28d supply, fill #1
  Filled 2021-11-28: qty 4, 28d supply, fill #2

## 2021-09-30 MED ORDER — ENBREL SURECLICK 50 MG/ML ~~LOC~~ SOAJ
SUBCUTANEOUS | 2 refills | Status: DC
  Filled 2021-09-30: qty 4, 28d supply, fill #0

## 2021-10-01 ENCOUNTER — Other Ambulatory Visit (HOSPITAL_COMMUNITY): Payer: Self-pay

## 2021-10-02 ENCOUNTER — Other Ambulatory Visit (HOSPITAL_COMMUNITY): Payer: Self-pay

## 2021-10-05 ENCOUNTER — Ambulatory Visit (INDEPENDENT_AMBULATORY_CARE_PROVIDER_SITE_OTHER): Payer: No Typology Code available for payment source

## 2021-10-05 ENCOUNTER — Ambulatory Visit (HOSPITAL_COMMUNITY): Payer: No Typology Code available for payment source

## 2021-10-05 ENCOUNTER — Encounter (HOSPITAL_COMMUNITY): Payer: Self-pay

## 2021-10-05 ENCOUNTER — Other Ambulatory Visit: Payer: Self-pay

## 2021-10-05 ENCOUNTER — Ambulatory Visit (HOSPITAL_COMMUNITY)
Admission: RE | Admit: 2021-10-05 | Discharge: 2021-10-05 | Disposition: A | Discharge status: Home / Self Care | Payer: No Typology Code available for payment source | Source: Ambulatory Visit | Attending: Emergency Medicine | Admitting: Emergency Medicine

## 2021-10-05 VITALS — BP 136/83 | HR 87 | Temp 98.2°F | Resp 17

## 2021-10-05 DIAGNOSIS — M542 Cervicalgia: Secondary | ICD-10-CM | POA: Diagnosis not present

## 2021-10-05 DIAGNOSIS — M79645 Pain in left finger(s): Secondary | ICD-10-CM

## 2021-10-05 DIAGNOSIS — M545 Low back pain, unspecified: Secondary | ICD-10-CM

## 2021-10-05 DIAGNOSIS — S20312A Abrasion of left front wall of thorax, initial encounter: Secondary | ICD-10-CM
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
## 2021-10-05 NOTE — Discharge Instructions (Addendum)
Your x-ray was negative for injury ? ? ?Left neck pain and left lower back pain appears to be muscular, should steadily improve with time ? ?You may use Tylenol 500 to 1000 mg every 6 hours in addition to your current use of diclofenac ? ?You may place pillows underneath your neck and behind back for additional support and comfort ? ?You may place heat over the affected area and 15-minute intervals ? ?Your bruising on your chest and on your left shin should steadily improve with time,  ? ?You may place heat or ice over the affected area in 10 to 15-minute intervals to further help reduce swelling, you may place over-the-counter A&E ointment or similar product to help reduce scarring ? ?You may follow-up with urgent care or your primary doctor as needed for persisting symptoms ?

## 2021-10-06 ENCOUNTER — Other Ambulatory Visit: Payer: Self-pay | Admitting: Internal Medicine

## 2021-10-06 ENCOUNTER — Other Ambulatory Visit (HOSPITAL_COMMUNITY): Payer: Self-pay

## 2021-10-07 ENCOUNTER — Other Ambulatory Visit (HOSPITAL_COMMUNITY): Payer: Self-pay

## 2021-10-07 ENCOUNTER — Ambulatory Visit (INDEPENDENT_AMBULATORY_CARE_PROVIDER_SITE_OTHER): Payer: No Typology Code available for payment source | Admitting: Internal Medicine

## 2021-10-07 ENCOUNTER — Other Ambulatory Visit: Payer: Self-pay

## 2021-10-07 ENCOUNTER — Encounter: Payer: Self-pay | Admitting: Internal Medicine

## 2021-10-07 DIAGNOSIS — I10 Essential (primary) hypertension: Secondary | ICD-10-CM

## 2021-10-07 DIAGNOSIS — H547 Unspecified visual loss: Secondary | ICD-10-CM | POA: Diagnosis not present

## 2021-10-07 DIAGNOSIS — R42 Dizziness and giddiness: Secondary | ICD-10-CM

## 2021-10-07 MED ORDER — SCOPOLAMINE 1 MG/3DAYS TD PT72
1.0000 | MEDICATED_PATCH | TRANSDERMAL | 0 refills | Status: DC
  Filled 2021-10-07: qty 3, 9d supply, fill #0

## 2021-10-07 MED ORDER — SOLIFENACIN SUCCINATE 10 MG PO TABS
10.0000 mg | ORAL_TABLET | Freq: Every day | ORAL | 0 refills | Status: DC
  Filled 2021-10-07: qty 90, 90d supply, fill #0

## 2021-10-07 NOTE — Assessment & Plan Note (Signed)
Overall stable. Going on cruise soon so rx transderm patches to use during cruise which might help dizziness symptoms. Had viral illness a few weeks before onset making BPPV most likely. BP was high and is now controlled again.  ?

## 2021-10-07 NOTE — Assessment & Plan Note (Signed)
She did try stopping amlodipine 5 mg daily however had recurrence of her high blood pressure. Is now back on amlodipine 5 mg daily and BP at goal. Will continue.  ?

## 2021-10-07 NOTE — Assessment & Plan Note (Signed)
Having some vision blurring lasting for seconds throughout the day. No clear triggers. Reviewed recent MRI brain with her without clear cause but was referred to neurology. She has up to date vision exam. I suspect she may need MRI cervical to fully rule out demyelinating plaques as there was some concern for that on MRI brain. Her symptoms are not consistent with MS but cannot be ruled out at this time. ?

## 2021-10-07 NOTE — Progress Notes (Signed)
? ?  Subjective:  ? ?Patient ID: Maria Wiley, female    DOB: Sep 30, 1975, 46 y.o.   MRN: [id number] ? ?HPI ?The patient is a 46 YO female coming in for follow up. ? ?Review of Systems  ?Constitutional: Negative.   ?HENT: Negative.    ?Eyes:  Positive for visual disturbance.  ?Respiratory:  Negative for cough, chest tightness and shortness of breath.   ?Cardiovascular:  Negative for chest pain, palpitations and leg swelling.  ?Gastrointestinal:  Negative for abdominal distention, abdominal pain, constipation, diarrhea, nausea and vomiting.  ?Musculoskeletal: Negative.   ?Skin: Negative.   ?Neurological:  Positive for dizziness.  ?Psychiatric/Behavioral: Negative.    ? ?Objective:  ?Physical Exam ?Constitutional:   ?   Appearance: She is well-developed.  ?HENT:  ?   Head: Normocephalic and atraumatic.  ?Eyes:  ?   Extraocular Movements: Extraocular movements intact.  ?   Pupils: Pupils are equal, round, and reactive to light.  ?Cardiovascular:  ?   Rate and Rhythm: Normal rate and regular rhythm.  ?Pulmonary:  ?   Effort: Pulmonary effort is normal. No respiratory distress.  ?   Breath sounds: Normal breath sounds. No wheezing or rales.  ?Abdominal:  ?   General: Bowel sounds are normal. There is no distension.  ?   Palpations: Abdomen is soft.  ?   Tenderness: There is no abdominal tenderness. There is no rebound.  ?Musculoskeletal:  ?   Cervical back: Normal range of motion.  ?Skin: ?   General: Skin is warm and dry.  ?Neurological:  ?   Mental Status: She is alert and oriented to person, place, and time.  ?   Coordination: Coordination normal.  ? ? ?Vitals:  ? 10/07/21 0944  ?BP: 138/86  ?Pulse: 84  ?Temp: 98.5 ?F (36.9 ?C)  ?TempSrc: Oral  ?SpO2: 96%  ?Weight: 191 lb 2 oz (86.7 kg)  ?Height: 5\' 7"  (1.702 m)  ? ? ?This visit occurred during the SARS-CoV-2 public health emergency.  Safety protocols were in place, including screening questions prior to the visit, additional usage of staff PPE, and extensive cleaning  of exam room while observing appropriate contact time as indicated for disinfecting solutions.  ? ?Assessment & Plan:  ? ?

## 2021-10-07 NOTE — Patient Instructions (Addendum)
The patch last 3 days and you place behind the ear. Switch ears with each patch. The main side effect is dry mouth which generally is mild. It can be bad enough that people remove the patch. Make sure to wash hands well after applying or removing patch as the medicine can be harmful if it accidentally gets into the eye.     

## 2021-10-09 ENCOUNTER — Other Ambulatory Visit (HOSPITAL_COMMUNITY): Payer: Self-pay

## 2021-10-20 ENCOUNTER — Telehealth: Payer: Self-pay | Admitting: Neurology

## 2021-10-20 NOTE — Telephone Encounter (Signed)
Pt would like a call from the nurse to discuss a work in appt. ?

## 2021-10-20 NOTE — Telephone Encounter (Signed)
Spoke with pt and scheduled her for 3/23 at 2:30 pm arrival 2:00 PM.  ?

## 2021-10-21 ENCOUNTER — Other Ambulatory Visit (HOSPITAL_COMMUNITY): Payer: Self-pay

## 2021-10-23 ENCOUNTER — Ambulatory Visit (INDEPENDENT_AMBULATORY_CARE_PROVIDER_SITE_OTHER): Payer: No Typology Code available for payment source | Admitting: Neurology

## 2021-10-23 ENCOUNTER — Other Ambulatory Visit (HOSPITAL_COMMUNITY): Payer: Self-pay

## 2021-10-23 VITALS — Ht 67.0 in | Wt 199.0 lb

## 2021-10-23 DIAGNOSIS — R9082 White matter disease, unspecified: Secondary | ICD-10-CM

## 2021-10-23 DIAGNOSIS — R9089 Other abnormal findings on diagnostic imaging of central nervous system: Secondary | ICD-10-CM

## 2021-10-23 DIAGNOSIS — I776 Arteritis, unspecified: Secondary | ICD-10-CM

## 2021-10-23 DIAGNOSIS — R42 Dizziness and giddiness: Secondary | ICD-10-CM

## 2021-10-23 DIAGNOSIS — I679 Cerebrovascular disease, unspecified: Secondary | ICD-10-CM | POA: Diagnosis not present

## 2021-10-23 MED ORDER — METHYLPREDNISOLONE 4 MG PO TBPK
ORAL_TABLET | ORAL | 1 refills | Status: DC
  Filled 2021-10-23: qty 21, 6d supply, fill #0
  Filled 2022-01-09: qty 21, 6d supply, fill #1

## 2021-10-23 NOTE — Progress Notes (Signed)
? ?[facility] ? ? ? ?Provider:  Dr Lucia Gaskins ?Requesting Provider: Myrlene Broker, * ?Primary Care Provider:  Myrlene Broker, MD ? ?CC:  Headache ? ?10/23/2021: She was sick in January and around the 10th-14th and in January she started having dizzy spells, she lost her vision for a few seconds and since then she has been experiencing lots of dizziness. Mostly to the left. The room is spinning, she feels like she is walking to the left side, she had a vacation planned the feeling of being on that boat is how she feels. Sometimes her legs are jello. No ear pain. Had that recently checked. She has floaters she has an eye exam scheduled. Doesn't really have migraines often, has never had something like this with her migraines. Getting up may make her dizzy. If she turns her head she may get dizzy. Or simply walking. No nausea. No neckpain. No vision chanes since lost vision but did have an episode of losing vision. She has fallen during an episodes. Initially would last long periods now a few times a day for a few minutes. No other focal neurologic deficits, associated symptoms, inciting events or modifiable factors. ? ? ?IMPRESSION: 09/30/2021: personally reviewed imaging and agree with the following: ?1. No acute intracranial abnormality when allowing for pronounced ?metal susceptibility artifact related to dental hardware. ?2. Progressed since 2020 and moderately advanced for age but ?nonspecific cerebral white matter signal changes. There is ?underlying chronic volume loss in the posterior body of the corpus ?callosum. ?Top differential considerations include accelerated/hereditary small ?vessel ischemia, hypercoagulable state, vasculitis, and ?Demyelination ? ?Patient complains of symptoms per HPI as well as the following symptoms: dizziness . Pertinent negatives and positives per HPI. All others negative ? ? ?Interval history 05/10/2019: Patient here for follow up for migraines. The  Aimovig is working. When she has a migraine she takes excedrin. She is not taking it often.  She saw Dr. Kieth Brightly for her memory and they had a long talk. She wakes with migraines. This may be rebound headache from caffeine. She does not snore but will check with husband.  She wsa having migraines at least 15 days a month and reduced by > 50%. Diagnosis For neuropsych testing was sleep deprivation.  Reviewed MRI images with patient, discussed white matter changes, watching vascular risk factors. ? ? ?HPI:  Maria Wiley is a 46 y.o. female here as requested by Myrlene Broker, * for headache. PMHx IBS, hypothyroidism, RA, Ankylosing Spondylitis, OA, headache. She has a hx of migraines. 2-3 months ago she started forgetting things. She has to write everything down. She saw her pcp and recommended the MRI of the brain. She has some right hand tremors. The worse is the memory loss. No head trauma. No new medications. Probably ongoing longer than 2-3 months, wrote it off as getting older. She doesn't remember both remote and short-term memory. Her son has DM1 but has it under control so no stress. Family is noticing. At work she kees detailed notes so she doesn't forget, she is scared people will notice. She has to make lists and if she doesn't make the list she wont et anything done. She doesn't remember why she walks into stores. She may zone out, she thought it was attention maybe ADHD. She doesn't remember everything she thinks she should. She has missed some bills, she has a "bill book" and has forgotten to pay for something. No Hx of learning disabilities, did well in school.  She can manage money, days seem to run together but she usually knows what day and month and year it is. Her family says she repeats things a lot. She has 4 boys. She sleeps fairly well, she has a lot of stress at work, she wakes up at 1am-4am, she can;t get a complete night's sleep but this new. She snores. She wakes up sometimes.  She feels tired in the morning but she can still function. They just opened up a new clinic in [place] at work and this was stressful. She has anxiety, she doesn't ever unwind, she can;t just sit down and relax. She is on Lexapro for anxiety. She wakes up with headaches. Migraines starte 20+ years ago. Recently worsening, she cn wake up with pounding, on the left side of the head, they can be positional. She keeps bottles everywhere, 3-4 migraines a week. +light and sound and noise sensitivity. Migraines bad the last few years. 22 headache days a month, 12 migraine days, migraines can last 24-72 hours, no nausea or vomiting, she has episodes of black missing vision spots. At least 6 months at this frequency. No aura. [facility] rheumatology. ? ?Medications tried: Flexeril, lexapro, Topiramate contraindicated, tried nortriptyline ? ?Reviewed notes, labs and imaging from outside physicians, which showed: ? ?Labs 10/22/2017: ?RF 9.8, CCP < 1, CCP Igg neg, crp <5, CBC with mild anemia hgb 10.9, CMP normal BUN 9, creat 0.62, sed 25, hgba1c 5.7, TSH nml, ldl 116, b12 245 ? ?Personally reviewed MRI of the brain images and agree with the following: ? ?1.  No acute intracranial abnormality. ?2. Abnormal but nonspecific signal changes in the cerebral white matter, most notably in the right middle frontal gyrus and the posterior body of the corpus callosum. Top differential considerations include accelerated/hereditary small vessel ischemia, sequelae of trauma, hypercoagulable state, vasculitis, prior infection or demyelination. ?  ?hgba1c 5.7, cmp nml, cbc mild anemia. B12 245(should check MMA) ? ?Review of Systems: ?Patient complains of symptoms per HPI as well as the following symptoms: headaches, memory loss, cognitive changes, confusion, sleep problems. Pertinent negatives and positives per HPI. All others negative. ? ? ?Social History  ? ?Socioeconomic History  ? Marital status: Married  ?  Spouse name: Not on file  ?  Number of children: 4  ? Years of education: Not on file  ? Highest education level: Bachelor's degree (e.g., BA, AB, BS)  ?Occupational History  ? Not on file  ?Tobacco Use  ? Smoking status: Never  ? Smokeless tobacco: Never  ?Vaping Use  ? Vaping Use: Never used  ?Substance and Sexual Activity  ? Alcohol use: Never  ? Drug use: Never  ? Sexual activity: Yes  ?  Birth control/protection: Surgical  ?Other Topics Concern  ? Not on file  ?Social History Narrative  ? Lives at home with her husband & her 4 sons  ? Right handed  ? Caffeine: 2-3 cups per day  ? ?Social Determinants of Health  ? ?Financial Resource Strain: Not on file  ?Food Insecurity: Not on file  ?Transportation Needs: Not on file  ?Physical Activity: Not on file  ?Stress: Not on file  ?Social Connections: Not on file  ?Intimate Partner Violence: Not on file  ? ? ?Family History  ?Problem Relation Age of Onset  ? CAD Maternal Grandfather   ? Diabetes Maternal Grandfather   ? Hearing loss Maternal Grandfather   ? Heart disease Maternal Grandfather   ? Hyperlipidemia Maternal Grandfather   ? Hypertension Mother   ?  Arthritis Mother   ? Arthritis Maternal Grandmother   ? Heart disease Maternal Grandmother   ? Hyperlipidemia Maternal Grandmother   ? Migraines Neg Hx   ? Headache Neg Hx   ? ? ?Past Medical History:  ?Diagnosis Date  ? Ankylosing spondylitis (HCC)   ? Chest pain   ? COVID-19 06/2020  ? GERD (gastroesophageal reflux disease)   ? Hypertension   ? Migraines   ? RA (rheumatoid arthritis) (HCC)   ? Thyroid disease   ? Urinary incontinence   ? ? ?Patient Active Problem List  ? Diagnosis Date Noted  ? Dizziness 09/26/2021  ? Vision loss 09/26/2021  ? B12 deficiency 09/26/2021  ? Overweight 08/20/2021  ? Numbness and tingling 02/07/2021  ? Chronic migraine without aura without status migrainosus, not intractable 05/10/2019  ? Allergic rhinitis 05/08/2019  ? Essential hypertension 03/10/2019  ? Attention and concentration deficit 01/24/2019  ?  Ankylosing spondylitis (HCC) 01/15/2018  ? Routine general medical examination at a health care facility 01/15/2018  ? IBS (irritable bowel syndrome) 01/15/2018  ? RA (rheumatoid arthritis) (HCC)   ? Hypothy

## 2021-10-23 NOTE — Patient Instructions (Addendum)
Imaging of blood vessels head and neck (CTA of the head and neck) ?Run images by MS specialist - will let you know ?[facility]  ?Medrol dosepak ?Mychart me after medrol dosepak and let me know when you are going to start vestibular therapy ? ? ?Benign Paroxysmal Positional Vertigo (BPPV) ?Benign paroxysmal positional vertigo (BPPV) is an inner ear disorder. A person with BPPV experiences a sudden spinning sensation whenever they move their head. BPPV isn?t a sign of a serious problem. If it doesn?t disappear on its own within six weeks, a simple in-office procedure can help ease your symptoms. ?[facility] [place] [phone] ?APPOINTMENTS & LOCATIONS ?REQUEST AN APPOINTMENT ?Symptoms and Causes Diagnosis and Tests Management and Treatment Prevention Outlook / Prognosis Living With Resources ?OVERVIEW ?How otoconia (calcium carbonate particles) from the utricle get trapped in the semicircular canals of the inner ear, causing BPPV. ?BPPV can happen when otoconia (calcium carbonate particles) from the utricle get trapped in the semicircular canals of your inner ear. ?What is benign paroxysmal positional vertigo (BPPV)? ?Benign paroxysmal positional vertigo (BPPV) is a common inner ear disorder. With BPPV, changes in your head position -- such as tipping your head backward or sitting up in bed -- lead to sudden vertigo (a feeling that the room is spinning). ? ?BPPV isn?t a sign of a serious problem, and it usually disappears on its own within a few days of the first episode. (It could take several weeks for some people.) However, the symptoms of BPPV can be very frightening and may be dangerous, especially in adults over the age of 106. The unsteadiness of BPPV can lead to falls, which are a leading cause of fractures. ? ?Who does benign paroxysmal positional vertigo affect? ?BPPV can affect people of all ages, but it?s most common in adults over the age of 51. About half of all people in this age range  experience at least one episode of BPPV in their lifetime. ? ?BPPV can affect children, but it?s rare. ? ?How common is BPPV? ?Benign paroxysmal positional vertigo is the most common inner ear disorder. In fact, approximately 20% of people who are evaluated for dizziness are diagnosed with BPPV. ? ?Is BPPV permanent? ?BPPV usually goes away on its own. However, until it?s successfully treated, it can come back. In some cases, months -- or even years -- go by before another episode occurs. ? ?SYMPTOMS AND CAUSES ?What are the symptoms of benign paroxysmal positional vertigo? ?Vertigo is the main symptom of BPPV. This vertigo sensation can range from mild to severe and may last seconds, or up to 1 minute. It may be accompanied by other benign paroxysmal positional vertigo symptoms, including: ? ?Dizziness. ?Lightheadedness. ?Balance problems. ?Nausea and vomiting. ?Blurred vision. ?Nystagmus (rapid, involuntary eye movements). ?While BPPV usually only affects one ear at a time, it can potentially affect both ears. ? ?What triggers BPPV? ?BPPV is almost always triggered by a change in your head?s position. Some people may notice symptoms when lying down or sitting up in bed. Others might notice symptoms when they tilt their head back or to the side. These symptoms often worsen with age due to normal wear and tear of the inner ear structures. ? ?In some instances, BPPV may be a symptom of another inner ear condition, such as: ? ?Labyrinthitis. ?Vestibular neuritis. ?Acoustic neuroma. ?Additionally, BPPV may accompany migraines, or it may develop after a traumatic event -- such as a fall, accident or sports injury. ? ?Why do changes in head position  cause BPPV? ?BPPV develops when calcium carbonate particles (otoconia) move into your semicircular canals (inner ear structures that control balance) and become trapped. Normally, the otoconia are part of your utricle, a vestibular organ next to your semicircular  canals. ? ?In your utricle, the otoconia may become loose due to injury, infection or age. As your head position changes, the otoconia roll around and push on tiny hair-like structures (cilia) within your semicircular canals. Those cilia help transmit information about balance to your brain. Vertigo develops when the cilia are stimulated by the rolling otoconia. ? ?DIAGNOSIS AND TESTS ?How is benign paroxysmal positional vertigo diagnosed? ?Your healthcare provider can diagnose BPPV during an office visit. They?ll perform a physical examination and ask questions about your symptoms and medical history. ? ?MANAGEMENT AND TREATMENT ?What is the fastest way to cure BPPV? ?The most effective benign paroxysmal positional vertigo treatments involve physical therapy exercises. The goal of these exercises is to move the calcium carbonate particles out of your semicircular canals and back into your utricle. Here, the particles resorb more easily and don?t cause uncomfortable symptoms. ? ?You can also take motion sickness medications to relieve your symptoms. However, you shouldn?t take these medications long term. ? ?Benign paroxysmal positional vertigo exercises: How do they work? ?BPPV exercises -- sometimes called canalith repositioning procedures -- typically take about 15 minutes to complete. Particle repositioning involves a series of physical movements that change the position of your head and body. These actions shift the otoconia out of your semicircular canals and back into their proper location in your utricle. ? ?A single particle repositioning procedure is effective in treating about 80% to 90% of cases of BPPV. Additional BPPV exercises may be needed if symptoms continue. ? ?Your healthcare provider can perform this maneuver during an office visit. They can also demonstrate how to do these exercises at home to ease your BPPV symptoms. ? ?In the meantime, here are some step-by-step instructions to try: ? ?Step  1: Start by sitting up on a bed or table. Turn your head 45 degrees toward the affected ear. ?Step 2: Quickly lie back, keeping your head turned toward the affected ear as you lie back with your head slightly over the edge of the bed or table. Wait about a minute or until you stop having symptoms. ?Step 3: Without raising your head, turn your head quickly in the opposite direction so that your ?good? ear is parallel with -- but slightly over the edge of -- the table or bed. Wait about a minute or until you stop having symptoms. ?Step 4: Roll onto your side. Continue to turn your head another 90 degrees in the same direction as step 3 so that your nose is now facing the floor. Wait about a minute. ?Step 5: Keeping your chin tucked in toward your shoulder, sit up in the direction your body is facing. Follow any post-particle repositioning instructions given to you by your healthcare provider. ? ? ?Right position ? ? ? ?Left position ? ?Can BPPV go away on its own? ?Yes. In many cases, BPPV goes away on its own eventually. But it can come back. If it does, your healthcare provider can tell you how to manage your symptoms when they occur. ? ?PREVENTION ?How can I reduce my risk for BPPV? ?You can?t prevent BPPV, but you can manage it with particle repositioning exercises. To reduce your risk of trauma-related BPPV, be sure to wear a helmet when biking, playing contact sports or participating in  other similar activities. ? ?OUTLOOK / PROGNOSIS ?What can I expect if I have benign paroxysmal positional vertigo? ?The good news is that BPPV doesn?t indicate a serious health problem. Even so, dealing with your symptoms can be scary and frustrating. Your healthcare provider can teach you how to do BPPV exercises at home so you can manage your symptoms at the first sign of trouble. ? ?How long does BPPV last? ?In most cases, a BPPV episode lasts 1 to 2 minutes. Your symptoms may be mild, or they may be so severe that you throw  up. You might even lose your balance when you try to stand or walk. ? ?Labyrinthitis ?Labyrinthitis is an inner ear infection. The inner ear is a system of tubes and canals (labyrinth) that are filled with fluid. T

## 2021-10-27 ENCOUNTER — Institutional Professional Consult (permissible substitution): Payer: No Typology Code available for payment source | Admitting: Neurology

## 2021-10-27 ENCOUNTER — Encounter: Payer: Self-pay | Admitting: Neurology

## 2021-10-28 ENCOUNTER — Other Ambulatory Visit: Payer: Self-pay | Admitting: Internal Medicine

## 2021-10-28 ENCOUNTER — Other Ambulatory Visit (HOSPITAL_COMMUNITY): Payer: Self-pay

## 2021-10-28 DIAGNOSIS — E039 Hypothyroidism, unspecified: Secondary | ICD-10-CM

## 2021-10-28 MED ORDER — MONTELUKAST SODIUM 10 MG PO TABS
10.0000 mg | ORAL_TABLET | Freq: Every day | ORAL | 2 refills | Status: DC
  Filled 2021-10-28: qty 30, 30d supply, fill #0
  Filled 2021-12-02: qty 30, 30d supply, fill #1
  Filled 2021-12-25 – 2021-12-26 (×2): qty 30, 30d supply, fill #2

## 2021-10-29 ENCOUNTER — Encounter: Payer: Self-pay | Admitting: Neurology

## 2021-10-29 ENCOUNTER — Other Ambulatory Visit (HOSPITAL_COMMUNITY): Payer: Self-pay

## 2021-10-30 ENCOUNTER — Telehealth: Payer: Self-pay | Admitting: Neurology

## 2021-10-30 ENCOUNTER — Other Ambulatory Visit (HOSPITAL_COMMUNITY): Payer: Self-pay

## 2021-10-30 NOTE — Telephone Encounter (Signed)
[facility] auth: [id number] (exp. 11/03/21 to 12/03/21) order sent to GI, they will reach out to the patient to schedule.  ?

## 2021-10-31 ENCOUNTER — Other Ambulatory Visit (HOSPITAL_COMMUNITY): Payer: Self-pay

## 2021-10-31 MED ORDER — LEVOTHYROXINE SODIUM 25 MCG PO TABS
25.0000 ug | ORAL_TABLET | Freq: Every day | ORAL | 2 refills | Status: DC
  Filled 2021-10-31: qty 90, 90d supply, fill #0
  Filled 2022-01-09 – 2022-02-16 (×2): qty 90, 90d supply, fill #1
  Filled 2022-05-17: qty 90, 90d supply, fill #2

## 2021-10-31 MED ORDER — LEFLUNOMIDE 20 MG PO TABS
20.0000 mg | ORAL_TABLET | Freq: Every day | ORAL | 0 refills | Status: DC
  Filled 2021-10-31: qty 30, 30d supply, fill #0

## 2021-10-31 MED ORDER — AMLODIPINE BESYLATE 5 MG PO TABS
5.0000 mg | ORAL_TABLET | Freq: Every day | ORAL | 0 refills | Status: DC
  Filled 2021-10-31: qty 90, 90d supply, fill #0

## 2021-10-31 NOTE — Telephone Encounter (Signed)
Pt requesting a cb regarding wegovy PA ? ?Phone [phone] ?

## 2021-11-03 ENCOUNTER — Telehealth: Payer: Self-pay

## 2021-11-03 NOTE — Telephone Encounter (Signed)
Called pt. LDVM asking her what current dose she is suppose to be taking. If possible if they patient knows if her insurance has changed their formulary. Office number was provided.  ? ? ?If pt calls back please verify what current dose of wegovy she is suppose to be taking.  ? ? ?

## 2021-11-04 ENCOUNTER — Other Ambulatory Visit (HOSPITAL_COMMUNITY): Payer: Self-pay

## 2021-11-06 ENCOUNTER — Encounter: Payer: Self-pay | Admitting: Physical Therapy

## 2021-11-06 ENCOUNTER — Ambulatory Visit: Payer: No Typology Code available for payment source | Attending: Neurology | Admitting: Physical Therapy

## 2021-11-06 ENCOUNTER — Other Ambulatory Visit (HOSPITAL_COMMUNITY): Payer: Self-pay

## 2021-11-06 DIAGNOSIS — R2681 Unsteadiness on feet: Secondary | ICD-10-CM | POA: Insufficient documentation

## 2021-11-06 DIAGNOSIS — R42 Dizziness and giddiness: Secondary | ICD-10-CM | POA: Insufficient documentation

## 2021-11-06 NOTE — Patient Instructions (Signed)
Access Code: [facility] ?URL: [email or web address] ?Date: 11/06/2021 ?Prepared by: [facility] ? ?Exercises ?- Seated Gaze Stabilization with Head Rotation  - 1 x daily - 5 x weekly - 2 sets - 30 sec hold ?- Seated Gaze Stabilization with Head Nod  - 1 x daily - 5 x weekly - 2 sets - 30 sec hold ?- Brandt-Daroff Vestibular Exercise  - 1 x daily - 5 x weekly - 2 sets - 3-5 reps ? ? ? ? ? ?

## 2021-11-06 NOTE — Therapy (Signed)
[place] [facility] ?[address], [address] ?[place], [place], [address] ?Phone: [phone]   Fax:  [phone] ? ?Physical Therapy Evaluation ? ?Patient Details  ?Name: Maria Wiley ?MRN: [id number] ?Date of Birth: 09-05-1975 ?Referring Provider (PT): Melvenia Beam, MD ? ? ?Encounter Date: 11/06/2021 ? ? PT End of Session - 11/06/21 0835   ? ? Visit Number 1   ? Number of Visits 13   ? Date for PT Re-Evaluation 12/18/21   ? Authorization Type [facility]   ? Authorization - Visit Number 1   ? Authorization - Number of Visits 25   ? PT Start Time [phone]   ? PT Stop Time 0830   ? PT Time Calculation (min) 32 min   ? Activity Tolerance Patient tolerated treatment well   ? Behavior During Therapy [facility] for tasks assessed/performed   ? ?  ?  ? ?  ? ? ?Past Medical History:  ?Diagnosis Date  ? Ankylosing spondylitis ([place])   ? Chest pain   ? COVID-19 06/2020  ? GERD (gastroesophageal reflux disease)   ? Hypertension   ? Migraines   ? RA (rheumatoid arthritis) ([place])   ? Thyroid disease   ? Urinary incontinence   ? ? ?Past Surgical History:  ?Procedure Laterality Date  ? ABDOMINAL HYSTERECTOMY    ? APPENDECTOMY    ? CHOLECYSTECTOMY    ? HERNIA REPAIR  2004  ? KNEE SURGERY Right 2006  ? ? ?There were no vitals filed for this visit. ? ? ? Subjective Assessment - 11/06/21 0759   ? ? Subjective Patient reports dizziness since January 29th. Was driving up a steep hill, and all of a sudden she got very dizzy and everything went black. Since then, she has been having episode of dizziness. Episodes last seconds-minutes and described as "feeling like rocking on a boat, head feels empty." Reports that she went on a cruise March11-18th- reports symptoms slightly worse after getting off the boat. Worse with quick head movements and getting up quickly, sometimes occurs spontaneously. Denies head trauma, hearing loss, tinnitus, otalgia, photo/phonophobia. Reports that she was sick in early January.  Reports some recent double vision and floaters- saw ophthalmologist and everything looks great. Some aural fullness and imbalance.   ? Pertinent History Amkylosing spondylitis, GERD, HTN, migraines, RA, R knee surgery   ? Limitations Lifting;Standing;Walking;House hold activities   ? Diagnostic tests Recent MRI report showed: nonspecific white matter changes progressed since 2020 and moderately advanced for age but nonspecific cerebral white matter signal changes. There is underlying chronic volume loss in the posterior body of the corpus callosum   ? Patient Stated Goals improve dizziness   ? Currently in Pain? Other (Comment)   referral not pain-related  ? ?  ?  ? ?  ? ? ? ? ? [facility] PT Assessment - 11/06/21 0806   ? ?  ? Assessment  ? Medical Diagnosis Vertigo   ? Referring Provider (PT) Melvenia Beam, MD   ? Onset Date/Surgical Date 08/31/21   ? Next MD Visit not scheduled   ? Prior Therapy no   ?  ? Precautions  ? Precautions None   ?  ? Balance Screen  ? Has the patient fallen in the past 6 months Yes   ? How many times? 1   ? Has the patient had a decrease in activity level because of a fear of falling?  No   ? Is the patient reluctant  to leave their home because of a fear of falling?  No   ?  ? Home Environment  ? Living Environment Private residence   ? Additional Comments lives with husband and 4 sons in a 3 story home   ?  ? Prior Function  ? Level of Independence Independent   ? Vocation Full time employment   ? Vocation Requirements desk work   ? Leisure none   ?  ? Cognition  ? Overall Cognitive Status Within Functional Limits for tasks assessed   ?  ? Sensation  ? Light Touch Appears Intact   ?  ? Ambulation/Gait  ? Ambulation/Gait Yes   ? Assistive device None   ? Gait Pattern Within Functional Limits   ? ?  ?  ? ?  ? ? ? ? ? ? ? ? ? Vestibular Assessment - 11/06/21 0808   ? ?  ? Oculomotor Exam  ? Oculomotor Alignment Normal   ? Ocular ROM intact   ? Spontaneous Absent   ? Gaze-induced  Absent   ?  Smooth Pursuits Intact   ? Saccades Intact   ?  ? Oculomotor Exam-Fixation Suppressed   ? Left Head Impulse intact   ? Right Head Impulse slightly positive   ?  ? Vestibulo-Ocular Reflex  ? VOR 1 Head Only (x 1 viewing) slow; mild "wooziness" in vertical and horizontal directions   ? VOR Cancellation Normal   ?  ? Positional Testing  ? Dix-Hallpike Dix-Hallpike Right;Dix-Hallpike Left   ? Horizontal Canal Testing Horizontal Canal Right;Horizontal Canal Left   ?  ? Dix-Hallpike Right  ? Dix-Hallpike Right Duration 0   ? Dix-Hallpike Right Symptoms No nystagmus   ?  ? Dix-Hallpike Left  ? Dix-Hallpike Left Duration 0   ? Dix-Hallpike Left Symptoms No nystagmus   ?  ? Horizontal Canal Right  ? Horizontal Canal Right Duration 0   ? Horizontal Canal Right Symptoms Normal   ?  ? Horizontal Canal Left  ? Horizontal Canal Left Duration 0   ? Horizontal Canal Left Symptoms Normal   ? ?  ?  ? ?  ? ? ? ? ? ?Objective measurements completed on examination: See above findings.  ? ? ? ? ? ? ? ? ? ? ? ? ? ? PT Education - 11/06/21 0834   ? ? Education Details Prognosis, POC, HEP- Access Code: [email or web address]; edu on vestibular hypofunction etiology and tx course   ? Person(s) Educated Patient   ? Methods Explanation;Demonstration;Tactile cues;Verbal cues;Handout   ? Comprehension Returned demonstration;Verbalized understanding   ? ?  ?  ? ?  ? ? ? PT Short Term Goals - 11/06/21 0840   ? ?  ? PT SHORT TERM GOAL #1  ? Title Patient to be independent with initial HEP.   ? Time 3   ? Period Weeks   ? Status New   ? Target Date 11/27/21   ? ?  ?  ? ?  ? ? ? ? PT Long Term Goals - 11/06/21 0840   ? ?  ? PT LONG TERM GOAL #1  ? Title Patient to be independent with advanced HEP.   ? Time 6   ? Period Weeks   ? Status New   ? Target Date 12/18/21   ?  ? PT LONG TERM GOAL #2  ? Title Patient to report 0/10 dizziness with standing vertical and horizontal VOR for 60 seconds.   ? Time 6   ?  Period Weeks   ? Status New   ? Target Date 12/18/21   ?  ?  PT LONG TERM GOAL #3  ? Title Patient will report 0/10 dizziness with bed mobility.   ? Time 6   ? Period Weeks   ? Status New   ? Target Date 12/18/21   ?  ? PT LONG TERM GOAL #4  ? Title Patient to score at least 20/24 on DGI in order to decrease risk of falls.   ? Time 6   ? Period Weeks   ? Status New   ? Target Date 12/18/21   ?  ? PT LONG TERM GOAL #5  ? Title Patient to report 75% improvement in dizziness.   ? Time 6   ? Period Weeks   ? Status New   ? Target Date 12/18/21   ? ?  ?  ? ?  ? ? ? ? ? ? ? ? ? Plan - 11/06/21 0835   ? ? Clinical Impression Statement Patient is a 46 y/o F presenting to OPPT with c/o dizziness since January 29th 2023 after being sick earlier that month. Dizziness is described as "feeling like rocking on a boat, head feels empty" and lasts seconds-minutes. Worse with quick head movements and getting up quickly, sometimes occurs spontaneously. Reports some recent double vision and floaters, aural fullness, and imbalance. Denies head trauma, hearing loss, tinnitus, otalgia, photo/phonophobia. Oculomotor exam revealed slightly positive R HIT and dizziness with vertical and horizontal VOR. Positional testing was negative, however patient with c/o dizziness upon sitting up from supine. Patient was educated on VOR and habituation HEP and reported understanding. Would benefit from skilled PT services 1-2x/week for 6 weeks to address aforementioned impairments in order to optimize level of function.   ? Personal Factors and Comorbidities Age;Comorbidity 3+;Time since onset of injury/illness/exacerbation;Past/Current Experience   ? Comorbidities Amkylosing spondylitis, GERD, HTN, migraines, RA, R knee surgery   ? Examination-Activity Limitations Bathing;Locomotion Level;Transfers;Bed Mobility;Reach Overhead;Bend;Sit;Caring for Others;Carry;Sleep;Squat;Dressing;Stairs;Stand;Hygiene/Grooming;Lift;Toileting   ? Examination-Participation Restrictions [employer];Interpersonal  Relationship;Shop;Driving;Community Activity;Cleaning;Occupation;Church;Meal Prep   ? Stability/Clinical Decision Making Evolving/Moderate complexity   ? Clinical Decision Making Moderate   ? Rehab Potential Good

## 2021-11-06 NOTE — Telephone Encounter (Signed)
[facility]  ?

## 2021-11-10 ENCOUNTER — Other Ambulatory Visit: Payer: Self-pay | Admitting: Internal Medicine

## 2021-11-10 ENCOUNTER — Other Ambulatory Visit (HOSPITAL_COMMUNITY): Payer: Self-pay

## 2021-11-10 MED ORDER — ESCITALOPRAM OXALATE 20 MG PO TABS
20.0000 mg | ORAL_TABLET | Freq: Every day | ORAL | 0 refills | Status: DC
  Filled 2021-11-10: qty 90, 90d supply, fill #0

## 2021-11-11 ENCOUNTER — Other Ambulatory Visit (HOSPITAL_COMMUNITY): Payer: Self-pay

## 2021-11-11 MED ORDER — DICLOFENAC SODIUM 75 MG PO TBEC
75.0000 mg | DELAYED_RELEASE_TABLET | Freq: Two times a day (BID) | ORAL | 2 refills | Status: DC
  Filled 2021-11-11: qty 60, 30d supply, fill #0
  Filled 2022-03-30: qty 60, 30d supply, fill #1

## 2021-11-13 ENCOUNTER — Encounter: Payer: Self-pay | Admitting: Physical Therapy

## 2021-11-13 ENCOUNTER — Ambulatory Visit: Payer: No Typology Code available for payment source | Admitting: Physical Therapy

## 2021-11-13 DIAGNOSIS — R42 Dizziness and giddiness: Secondary | ICD-10-CM

## 2021-11-13 DIAGNOSIS — R2681 Unsteadiness on feet: Secondary | ICD-10-CM

## 2021-11-13 NOTE — Therapy (Signed)
[place] [facility] ?[address], [address] ?[place], [place], [address] ?Phone: [phone]   Fax:  [phone] ? ?Physical Therapy Treatment ? ?Patient Details  ?Name: Maria Wiley ?MRN: [id number] ?Date of Birth: 28-Dec-1975 ?Referring Provider (PT): Melvenia Beam, MD ? ? ?Encounter Date: 11/13/2021 ? ? PT End of Session - 11/13/21 1614   ? ? Visit Number 2   ? Number of Visits 13   ? Date for PT Re-Evaluation 12/18/21   ? Authorization Type [facility]   ? Authorization - Visit Number 2   ? Authorization - Number of Visits 25   ? PT Start Time 1536   ? PT Stop Time 1610   ? PT Time Calculation (min) 34 min   ? Equipment Utilized During Treatment Gait belt   ? Activity Tolerance Patient tolerated treatment well   ? Behavior During Therapy [facility] for tasks assessed/performed   ? ?  ?  ? ?  ? ? ?Past Medical History:  ?Diagnosis Date  ? Ankylosing spondylitis ([place])   ? Chest pain   ? COVID-19 06/2020  ? GERD (gastroesophageal reflux disease)   ? Hypertension   ? Migraines   ? RA (rheumatoid arthritis) ([place])   ? Thyroid disease   ? Urinary incontinence   ? ? ?Past Surgical History:  ?Procedure Laterality Date  ? ABDOMINAL HYSTERECTOMY    ? APPENDECTOMY    ? CHOLECYSTECTOMY    ? HERNIA REPAIR  2004  ? KNEE SURGERY Right 2006  ? ? ?There were no vitals filed for this visit. ? ? Subjective Assessment - 11/13/21 1537   ? ? Subjective Has been performing HEP and notices increased dizziness with vertical VOR. Has not noticed much improvement yet.   ? Pertinent History Amkylosing spondylitis, GERD, HTN, migraines, RA, R knee surgery   ? Diagnostic tests Recent MRI report showed: nonspecific white matter changes progressed since 2020 and moderately advanced for age but nonspecific cerebral white matter signal changes. There is underlying chronic volume loss in the posterior body of the corpus callosum   ? Patient Stated Goals improve dizziness   ? Currently in Pain? No/denies   ? ?  ?   ? ?  ? ? ? ? ? [facility] PT Assessment - 11/13/21 0001   ? ?  ? Standardized Balance Assessment  ? Standardized Balance Assessment Dynamic Gait Index   ? Balance Master Testing Sensory Organization Test   ?  ? Dynamic Gait Index  ? Level Surface Normal   ? Change in Gait Speed Normal   ? Gait with Horizontal Head Turns Mild Impairment   ? Gait with Vertical Head Turns Mild Impairment   ? Gait and Pivot Turn Normal   mild dizziness  ? Step Over Obstacle Normal   ? Step Around Obstacles Mild Impairment   ? Steps Normal   ? Total Score 21   ?  ? High Level Balance  ? High Level Balance Comments --   M-CTSIB: EO/firm: mild sway, EC/firm: mild-mod stay, EO/foam: mild sway, EC/foam: mod-severe sway  ? ?  ?  ? ?  ? ? ? ? ? ? ? ? ? ? ? ? ? ? ? ? ? Vestibular Treatment/Exercise - 11/13/21 0001   ? ?  ? Vestibular Treatment/Exercise  ? Habituation Exercises Nestor Lewandowsky   ? Gaze Exercises X1 Viewing Horizontal;X1 Viewing Vertical   ?  ? Laruth Bouchard Daroff  ? Number of Reps  6   ?  Symptom Description  2.5/10 dizziness; cues to correct head postion; c/o 4/10 dizziness with 1 rep with EC   ?  ? X1 Viewing Horizontal  ? Foot Position sitting, standing   ? Reps --   30 sec  ? Comments cues to avoid stopping at midline; slighty more difficulty to L   c/o "black spot in vision" when looking L  ?  ? X1 Viewing Vertical  ? Foot Position sitting   ? Reps --   30 sec  ? Comments 4/10 dizziness   ? ?  ?  ? ?  ? ? ? ? ? Balance Exercises - 11/13/21 0001   ? ?  ? Balance Exercises: Standing  ? Standing Eyes Closed Wide (BOA);Narrow base of support (BOS);Head turns;Limitations   trying variations of EC, romberg/wide, head turns/nods; most difficulty with romberg EC head nods  ? ?  ?  ? ?  ? ? ? ? ? PT Education - 11/13/21 1614   ? ? Education Details update to HEP-Access Code: [email or web address]   ? Person(s) Educated Patient   ? Methods Explanation;Demonstration;Tactile cues;Verbal cues;Handout   ? Comprehension Returned demonstration;Verbalized  understanding   ? ?  ?  ? ?  ? ? ? PT Short Term Goals - 11/13/21 1615   ? ?  ? PT SHORT TERM GOAL #1  ? Title Patient to be independent with initial HEP.   ? Time 3   ? Period Weeks   ? Status Achieved   ? Target Date 11/27/21   ? ?  ?  ? ?  ? ? ? ? PT Long Term Goals - 11/13/21 1615   ? ?  ? PT LONG TERM GOAL #1  ? Title Patient to be independent with advanced HEP.   ? Time 6   ? Period Weeks   ? Status On-going   ? Target Date 12/18/21   ?  ? PT LONG TERM GOAL #2  ? Title Patient to report 0/10 dizziness with standing vertical and horizontal VOR for 60 seconds.   ? Time 6   ? Period Weeks   ? Status On-going   ? Target Date 12/18/21   ?  ? PT LONG TERM GOAL #3  ? Title Patient will report 0/10 dizziness with bed mobility.   ? Time 6   ? Period Weeks   ? Status On-going   ? Target Date 12/18/21   ?  ? PT LONG TERM GOAL #4  ? Title Patient to score at least 20/24 on DGI in order to decrease risk of falls.   ? Time 6   ? Period Weeks   ? Status On-going   ? Target Date 12/18/21   ?  ? PT LONG TERM GOAL #5  ? Title Patient to report 75% improvement in dizziness.   ? Time 6   ? Period Weeks   ? Status On-going   ? Target Date 12/18/21   ? ?  ?  ? ?  ? ? ? ? ? ? ? ? Plan - 11/13/21 1614   ? ? Clinical Impression Statement Patient arrived to session with report of increased dizziness with vertical rather than horizontal VOR. Patient scored 21/24 on DGI, indicating a decreased risk of falls. Most difficulty demonstrated with pivot turns and figure 8 walking. Sensory balance testing revealed increased imbalance with EC. Patient tolerated VOR activities sight c/o mild-mod dizziness in vertical direction. Able to progress horizontal VOR to standing. Also able to progress habituation  to [facility] with mild increase in symptoms. Patient reported understanding of HEP update with exercises that were well-tolerated today. Patient reported understanding and without complaints at end of session.   ? Comorbidities Amkylosing spondylitis,  GERD, HTN, migraines, RA, R knee surgery   ? PT Treatment/Interventions ADLs/Self Care Home Management;Canalith Repostioning;Cryotherapy;Moist Heat;Neuromuscular re-education;Balance training;Therapeutic exercise;Therapeutic activities;Functional mobility training;Stair training;Gait training;Patient/family education;Manual techniques;Dry needling;Passive range of motion;Taping;Vestibular   ? PT Next Visit Plan progress VOR and habituation training; balance activities with EC and turns   ? Consulted and Agree with Plan of Care Patient   ? ?  ?  ? ?  ? ? ?Patient will benefit from skilled therapeutic intervention in order to improve the following deficits and impairments:  Dizziness, Decreased activity tolerance, Decreased balance ? ?Visit Diagnosis: ?Dizziness and giddiness ? ?Unsteadiness on feet ? ? ? ? ?Problem List ?Patient Active Problem List  ? Diagnosis Date Noted  ? Dizziness 09/26/2021  ? Vision loss 09/26/2021  ? B12 deficiency 09/26/2021  ? Overweight 08/20/2021  ? Numbness and tingling 02/07/2021  ? Chronic migraine without aura without status migrainosus, not intractable 05/10/2019  ? Allergic rhinitis 05/08/2019  ? Essential hypertension 03/10/2019  ? Attention and concentration deficit 01/24/2019  ? Ankylosing spondylitis ([place]) 01/15/2018  ? Routine general medical examination at a health care facility 01/15/2018  ? IBS (irritable bowel syndrome) 01/15/2018  ? RA (rheumatoid arthritis) ([place])   ? Hypothyroidism   ? ? ?Janene Harvey, PT, DPT ?11/13/21 4:16 PM ? ? ?[place] [facility] ?[address], [address] ?[place], [place], [address] ?Phone: [phone]   Fax:  [phone] ? ?Name: MILDERD OZANICH ?MRN: [id number] ?Date of Birth: [birth year] ? ? ? ?

## 2021-11-17 ENCOUNTER — Telehealth: Payer: Self-pay | Admitting: Neurology

## 2021-11-17 ENCOUNTER — Ambulatory Visit: Payer: No Typology Code available for payment source

## 2021-11-17 DIAGNOSIS — R42 Dizziness and giddiness: Secondary | ICD-10-CM

## 2021-11-17 DIAGNOSIS — R2681 Unsteadiness on feet: Secondary | ICD-10-CM

## 2021-11-17 NOTE — Patient Instructions (Addendum)
Access Code: [email or web address] ?URL: [email or web address] ?Date: 11/17/2021 ?Prepared by: Sherlyn Lees ? ?Exercises ?- Gaze Stability (VOR) x1 Forward Ambulation With Distant Target With Horizontal Head Turns  - 1 x daily - 7 x weekly ?

## 2021-11-17 NOTE — Telephone Encounter (Signed)
Spoke with patient per Dr Lucia GaskinsAhern will order Lumbar puncture today  Pt thanked me for calling her back  ?

## 2021-11-17 NOTE — Telephone Encounter (Signed)
Pt called concerning the lumbar puncture Dr. Lucia GaskinsAhern was to order. ?Pt would like to know if she needs to call and schedule or if someone will call her about this.Would like a call back.  ?  ? ?

## 2021-11-17 NOTE — Therapy (Signed)
[place] ?[facility] ?[address], [address] ?[place], [place], [address] ?Phone: [phone]   Fax:  [phone] ? ?Physical Therapy Treatment ? ?Patient Details  ?Name: Maria Wiley ?MRN: [id number] ?Date of Birth: 05-20-1976 ?Referring Provider (PT): Anson Fret, MD ? ? ?Encounter Date: 11/17/2021 ? ? PT End of Session - 11/17/21 0805   ? ? Visit Number 3   ? Number of Visits 13   ? Date for PT Re-Evaluation 12/18/21   ? Authorization Type [facility]   ? Authorization - Visit Number 3   ? Authorization - Number of Visits 25   ? PT Start Time 0805   ? PT Stop Time 0845   ? PT Time Calculation (min) 40 min   ? Equipment Utilized During Treatment Gait belt   ? Activity Tolerance Patient tolerated treatment well   ? Behavior During Therapy [facility] for tasks assessed/performed   ? ?  ?  ? ?  ? ? ?Past Medical History:  ?Diagnosis Date  ? Ankylosing spondylitis ([facility])   ? Chest pain   ? COVID-19 06/2020  ? GERD (gastroesophageal reflux disease)   ? Hypertension   ? Migraines   ? RA (rheumatoid arthritis) ([facility])   ? Thyroid disease   ? Urinary incontinence   ? ? ?Past Surgical History:  ?Procedure Laterality Date  ? ABDOMINAL HYSTERECTOMY    ? APPENDECTOMY    ? CHOLECYSTECTOMY    ? HERNIA REPAIR  2004  ? KNEE SURGERY Right 2006  ? ? ?There were no vitals filed for this visit. ? ? Subjective Assessment - 11/17/21 0806   ? ? Subjective Not much of a change, notes still some spontaneous onset with sitting at desk, arising form laying down.   ? Pertinent History Amkylosing spondylitis, GERD, HTN, migraines, RA, R knee surgery   ? Diagnostic tests Recent MRI report showed: nonspecific white matter changes progressed since 2020 and moderately advanced for age but nonspecific cerebral white matter signal changes. There is underlying chronic volume loss in the posterior body of the corpus callosum   ? Patient Stated Goals improve dizziness   ? ?  ?  ? ?  ? ? ? ? ? ? ? ? ? ? Vestibular  Assessment - 11/17/21 0001   ? ?  ? Oculomotor Exam-Fixation Suppressed   ? Left Head Impulse intact   ? Right Head Impulse intact   ? ?  ?  ? ?  ? ? ? ? ? ? ? ? ? ? ? ? Vestibular Treatment/Exercise - 11/17/21 0001   ? ?  ? Vestibular Treatment/Exercise  ? Vestibular Treatment Provided Gaze   ? Habituation Exercises Francee Piccolo Daroff;Standing Horizontal Head Turns   ? Gaze Exercises X1 Viewing Horizontal;X1 Viewing Vertical   ?  ? Francee Piccolo Daroff  ? Number of Reps  12   ? Symptom Description  notes symptoms when coming up from right side.   ?  ? Standing Horizontal Head Turns  ? Number of Reps  4   ? Symptom Description  walking with slower velocity and perofmring rapid horizontal head turns   ?  ? X1 Viewing Horizontal  ? Foot Position standing   ? Time 0001   ? Reps 6   ? Comments cues to avoid stopping at midline; slighty more difficulty to L. First on firm, then foam   ?  ? X1 Viewing Vertical  ? Foot Position standing   ? Time 0001   ?  Reps 6   ? Comments cues for frequency and continuous movement   ? ?  ?  ? ?  ? ? ? ? ? Balance Exercises - 11/17/21 0001   ? ?  ? Balance Exercises: Standing  ? Standing Eyes Closed Head turns;Solid surface;3 reps;30 secs;Foam/compliant surface   ? ?  ?  ? ?  ? ? ? ? ? ? ? PT Short Term Goals - 11/13/21 1615   ? ?  ? PT SHORT TERM GOAL #1  ? Title Patient to be independent with initial HEP.   ? Time 3   ? Period Weeks   ? Status Achieved   ? Target Date 11/27/21   ? ?  ?  ? ?  ? ? ? ? PT Long Term Goals - 11/13/21 1615   ? ?  ? PT LONG TERM GOAL #1  ? Title Patient to be independent with advanced HEP.   ? Time 6   ? Period Weeks   ? Status On-going   ? Target Date 12/18/21   ?  ? PT LONG TERM GOAL #2  ? Title Patient to report 0/10 dizziness with standing vertical and horizontal VOR for 60 seconds.   ? Time 6   ? Period Weeks   ? Status On-going   ? Target Date 12/18/21   ?  ? PT LONG TERM GOAL #3  ? Title Patient will report 0/10 dizziness with bed mobility.   ? Time 6   ? Period  Weeks   ? Status On-going   ? Target Date 12/18/21   ?  ? PT LONG TERM GOAL #4  ? Title Patient to score at least 20/24 on DGI in order to decrease risk of falls.   ? Time 6   ? Period Weeks   ? Status On-going   ? Target Date 12/18/21   ?  ? PT LONG TERM GOAL #5  ? Title Patient to report 75% improvement in dizziness.   ? Time 6   ? Period Weeks   ? Status On-going   ? Target Date 12/18/21   ? ?  ?  ? ?  ? ? ? ? ? ? ? ? Plan - 11/17/21 1209   ? ? Clinical Impression Statement Difficulty with maintaining smooth and rapid head movements with pt tending to pause movement with left rotation and more reticent to perform rapid head rotation to the right. During VOR activities right rotation seems to be most problematic with eliciting unsteadiness and feeling of wooziness and also manifests during VOR walking task causing pathway deviation when head rotated to right with steps crossing midline even. Minimal gait/balance disturbance present when performing vertical head movements. Continued session to progress habituation activities   ? Comorbidities Amkylosing spondylitis, GERD, HTN, migraines, RA, R knee surgery   ? Examination-Participation Restrictions [employer] Work;Laundry;Interpersonal Relationship;Shop;Driving;Community Activity;Cleaning;Occupation;Church;Meal Prep   ? PT Treatment/Interventions ADLs/Self Care Home Management;Canalith Repostioning;Cryotherapy;Moist Heat;Neuromuscular re-education;Balance training;Therapeutic exercise;Therapeutic activities;Functional mobility training;Stair training;Gait training;Patient/family education;Manual techniques;Dry needling;Passive range of motion;Taping;Vestibular   ? PT Next Visit Plan progress VOR and habituation training; balance activities with EC and turns   ? PT Home Exercise Plan walking VOR x 1   ? Consulted and Agree with Plan of Care Patient   ? ?  ?  ? ?  ? ? ?Patient will benefit from skilled therapeutic intervention in order to improve the following deficits and  impairments:  Dizziness, Decreased activity tolerance, Decreased balance ? ?Visit Diagnosis: ?Dizziness and  giddiness ? ?Unsteadiness on feet ? ? ? ? ?Problem List ?Patient Active Problem List  ? Diagnosis Date Noted  ? Dizziness 09/26/2021  ? Vision loss 09/26/2021  ? B12 deficiency 09/26/2021  ? Overweight 08/20/2021  ? Numbness and tingling 02/07/2021  ? Chronic migraine without aura without status migrainosus, not intractable 05/10/2019  ? Allergic rhinitis 05/08/2019  ? Essential hypertension 03/10/2019  ? Attention and concentration deficit 01/24/2019  ? Ankylosing spondylitis ([facility]) 01/15/2018  ? Routine general medical examination at a health care facility 01/15/2018  ? IBS (irritable bowel syndrome) 01/15/2018  ? RA (rheumatoid arthritis) ([facility])   ? Hypothyroidism   ? ? ?Dion Body, PT ?11/17/2021, 12:13 PM ? ?[place] ?[facility] ?[address], [address] ?[place], [place], [address] ?Phone: [phone]   Fax:  [phone] ? ?Name: Maria Wiley ?MRN: [id number] ?Date of Birth: January 10, 1976 ? ? ? ?

## 2021-11-18 ENCOUNTER — Other Ambulatory Visit: Payer: Self-pay | Admitting: Neurology

## 2021-11-18 DIAGNOSIS — R9082 White matter disease, unspecified: Secondary | ICD-10-CM

## 2021-11-20 ENCOUNTER — Ambulatory Visit: Payer: No Typology Code available for payment source

## 2021-11-20 DIAGNOSIS — R42 Dizziness and giddiness: Secondary | ICD-10-CM

## 2021-11-20 DIAGNOSIS — R2681 Unsteadiness on feet: Secondary | ICD-10-CM

## 2021-11-20 NOTE — Therapy (Signed)
[place] ?[facility] ?[address], [address] ?[place], [place], [address] ?Phone: [phone]   Fax:  [phone] ? ?Physical Therapy Treatment ? ?Patient Details  ?Name: Maria Wiley ?MRN: [id number] ?Date of Birth: 1976-05-02 ?Referring Provider (PT): Anson Fret, MD ? ? ?Encounter Date: 11/20/2021 ? ? PT End of Session - 11/20/21 0800   ? ? Visit Number 4   ? Number of Visits 13   ? Date for PT Re-Evaluation 12/18/21   ? Authorization Type [facility]   ? Authorization - Visit Number 4   ? Authorization - Number of Visits 25   ? PT Start Time 0800   ? PT Stop Time 0830   ? PT Time Calculation (min) 30 min   ? Equipment Utilized During Treatment Gait belt   ? Activity Tolerance Patient tolerated treatment well   ? Behavior During Therapy [facility] for tasks assessed/performed   ? ?  ?  ? ?  ? ? ?Past Medical History:  ?Diagnosis Date  ? Ankylosing spondylitis ([facility])   ? Chest pain   ? COVID-19 06/2020  ? GERD (gastroesophageal reflux disease)   ? Hypertension   ? Migraines   ? RA (rheumatoid arthritis) ([facility])   ? Thyroid disease   ? Urinary incontinence   ? ? ?Past Surgical History:  ?Procedure Laterality Date  ? ABDOMINAL HYSTERECTOMY    ? APPENDECTOMY    ? CHOLECYSTECTOMY    ? HERNIA REPAIR  2004  ? KNEE SURGERY Right 2006  ? ? ?There were no vitals filed for this visit. ? ? Subjective Assessment - 11/20/21 0759   ? ? Subjective Feeling some better. 0/10 dizziness at present and no notable discomfort or wooziness when arising from bed this AM   ? Pertinent History Amkylosing spondylitis, GERD, HTN, migraines, RA, R knee surgery   ? Diagnostic tests Recent MRI report showed: nonspecific white matter changes progressed since 2020 and moderately advanced for age but nonspecific cerebral white matter signal changes. There is underlying chronic volume loss in the posterior body of the corpus callosum   ? Patient Stated Goals improve dizziness   ? ?  ?  ? ?  ? ? ? ? ? [facility] PT  Assessment - 11/20/21 0001   ? ?  ? Assessment  ? Medical Diagnosis Vertigo   ? Referring Provider (PT) Anson Fret, MD   ? Onset Date/Surgical Date 08/31/21   ? ?  ?  ? ?  ? ? ? ? ? ? ? ? ? ? ? ? ? ? ? ? ? Vestibular Treatment/Exercise - 11/20/21 0001   ? ?  ? Vestibular Treatment/Exercise  ? Vestibular Treatment Provided Gaze   ? Habituation Exercises Francee Piccolo Daroff;Standing Horizontal Head Turns;Standing Vertical Head Turns   ? Gaze Exercises X1 Viewing Horizontal;X1 Viewing Vertical;Eye/Head Exercise Horizontal;Eye/Head Exercise Vertical;X2 Viewing Horizontal;X2 Viewing Vertical   ?  ? Standing Horizontal Head Turns  ? Number of Reps  4   ? Symptom Description  walking VORx1 forwrad/backward   ?  ? Standing Vertical Head Turns  ? Number of Reps  4   ? Symptom Description  walking VOR x 1 forward/backward   ?  ? X1 Viewing Horizontal  ? Foot Position sitting, standing, stand on foam   ? Comments eyes only, then VOR x 1 for 3 trials x 30 sec cues for increased frequency.   ?  ? X1 Viewing Vertical  ? Foot Position sitting, standing, stand  on foam   ? Comments eyes only and then VOR x 1 each condition. 30 sec intervals 3/10 dizziness with this   ?  ? X2 Viewing Horizontal  ? Foot Position sitting   ? Reps 2   ?  Comments 30 sec   ?  ? X2 Viewing Vertical  ? Foot Position sitting   ? Reps 2   ? Comments 30 sec   ?  ? Eye/Head Exercise Horizontal  ? Foot Position sitting, standing, on foam   ? Comments 30 sec each condition   ?  ? Eye/Head Exercise Vertical  ? Foot Position sitting, standing, on foam   ? Comments 30 sec with post-it 2/10 dizziness. Standing brings dizziness to 3/10   ? ?  ?  ? ?  ? ? ? ? ? ? ? ? ? ? ? PT Short Term Goals - 11/13/21 1615   ? ?  ? PT SHORT TERM GOAL #1  ? Title Patient to be independent with initial HEP.   ? Time 3   ? Period Weeks   ? Status Achieved   ? Target Date 11/27/21   ? ?  ?  ? ?  ? ? ? ? PT Long Term Goals - 11/13/21 1615   ? ?  ? PT LONG TERM GOAL #1  ? Title Patient to  be independent with advanced HEP.   ? Time 6   ? Period Weeks   ? Status On-going   ? Target Date 12/18/21   ?  ? PT LONG TERM GOAL #2  ? Title Patient to report 0/10 dizziness with standing vertical and horizontal VOR for 60 seconds.   ? Time 6   ? Period Weeks   ? Status On-going   ? Target Date 12/18/21   ?  ? PT LONG TERM GOAL #3  ? Title Patient will report 0/10 dizziness with bed mobility.   ? Time 6   ? Period Weeks   ? Status On-going   ? Target Date 12/18/21   ?  ? PT LONG TERM GOAL #4  ? Title Patient to score at least 20/24 on DGI in order to decrease risk of falls.   ? Time 6   ? Period Weeks   ? Status On-going   ? Target Date 12/18/21   ?  ? PT LONG TERM GOAL #5  ? Title Patient to report 75% improvement in dizziness.   ? Time 6   ? Period Weeks   ? Status On-going   ? Target Date 12/18/21   ? ?  ?  ? ?  ? ? ? ? ? ? ? ? Plan - 11/20/21 0835   ? ? Clinical Impression Statement Pt reports improved symptoms today with no notable dizziness when arising from supine in the AM.  VOR activities demonstrate increased speed for head movements and without symptom provocation exceeding 1-3/10 on dizziness with vertical eye/head movements and this was only transient with standing VOR x 1 on foam.  Able to progress to VOR x 2 movements today initiated in sitting with pt again reporting dizziness 1-3/10 with vertial movements, less so with horizontal. Able to maintain stable balance on foam during activities without notable postural sway and minimal overshooting appreciated during drills   ? Comorbidities Amkylosing spondylitis, GERD, HTN, migraines, RA, R knee surgery   ? Examination-Participation Restrictions [employer];Interpersonal Relationship;Shop;Driving;Community Activity;Cleaning;Occupation;Church;Meal Prep   ? PT Treatment/Interventions ADLs/Self Care Home Management;Canalith Repostioning;Cryotherapy;Moist Heat;Neuromuscular re-education;Balance training;Therapeutic exercise;Therapeutic  activities;Functional mobility training;Stair training;Gait training;Patient/family education;Manual techniques;Dry needling;Passive range of motion;Taping;Vestibular   ? PT Next Visit Plan progress VOR and habituation training; balance activities with EC and turns   ? PT Home Exercise Plan walking VOR x 1, VOR x 2   ? Consulted and Agree with Plan of Care Patient   ? ?  ?  ? ?  ? ? ?Patient will benefit from skilled therapeutic intervention in order to improve the following deficits and impairments:  Dizziness, Decreased activity tolerance, Decreased balance ? ?Visit Diagnosis: ?Dizziness and giddiness ? ?Unsteadiness on feet ? ? ? ? ?Problem List ?Patient Active Problem List  ? Diagnosis Date Noted  ? Dizziness 09/26/2021  ? Vision loss 09/26/2021  ? B12 deficiency 09/26/2021  ? Overweight 08/20/2021  ? Numbness and tingling 02/07/2021  ? Chronic migraine without aura without status migrainosus, not intractable 05/10/2019  ? Allergic rhinitis 05/08/2019  ? Essential hypertension 03/10/2019  ? Attention and concentration deficit 01/24/2019  ? Ankylosing spondylitis ([facility]) 01/15/2018  ? Routine general medical examination at a health care facility 01/15/2018  ? IBS (irritable bowel syndrome) 01/15/2018  ? RA (rheumatoid arthritis) ([facility])   ? Hypothyroidism   ? ? ?Dion Body, PT ?11/20/2021, 8:39 AM ? ?[place] ?[facility] ?[address], [address] ?[place], [place], [address] ?Phone: [phone]   Fax:  [phone] ? ?Name: Maria Wiley ?MRN: [id number] ?Date of Birth: 11-15-1975 ? ? ? ?

## 2021-11-26 ENCOUNTER — Other Ambulatory Visit (HOSPITAL_COMMUNITY): Payer: Self-pay

## 2021-11-27 ENCOUNTER — Ambulatory Visit
Admission: RE | Admit: 2021-11-27 | Discharge: 2021-11-27 | Disposition: A | Discharge status: Home / Self Care | Payer: No Typology Code available for payment source | Source: Ambulatory Visit | Attending: Neurology | Admitting: Neurology

## 2021-11-27 ENCOUNTER — Ambulatory Visit: Payer: No Typology Code available for payment source

## 2021-11-27 DIAGNOSIS — R9089 Other abnormal findings on diagnostic imaging of central nervous system: Secondary | ICD-10-CM

## 2021-11-27 DIAGNOSIS — R42 Dizziness and giddiness: Secondary | ICD-10-CM

## 2021-11-27 DIAGNOSIS — I679 Cerebrovascular disease, unspecified: Secondary | ICD-10-CM

## 2021-11-27 DIAGNOSIS — R9082 White matter disease, unspecified: Secondary | ICD-10-CM

## 2021-11-27 DIAGNOSIS — I776 Arteritis, unspecified: Secondary | ICD-10-CM

## 2021-11-27 MED ORDER — IOPAMIDOL (ISOVUE-370) INJECTION 76%
75.0000 mL | Freq: Once | INTRAVENOUS | Status: AC | PRN
  Administered 2021-11-27: 75 mL via INTRAVENOUS

## 2021-11-28 ENCOUNTER — Ambulatory Visit: Payer: No Typology Code available for payment source

## 2021-11-28 ENCOUNTER — Other Ambulatory Visit (HOSPITAL_COMMUNITY): Payer: Self-pay

## 2021-12-01 ENCOUNTER — Ambulatory Visit: Payer: No Typology Code available for payment source | Attending: Neurology

## 2021-12-01 DIAGNOSIS — R2681 Unsteadiness on feet: Secondary | ICD-10-CM | POA: Diagnosis present

## 2021-12-01 DIAGNOSIS — R42 Dizziness and giddiness: Secondary | ICD-10-CM | POA: Diagnosis present

## 2021-12-01 NOTE — Therapy (Signed)
[facility] ?[address], [address] ?[place], [place], [address] ?Phone: [phone]   Fax:  [phone] ? ?Physical Therapy Treatment and D/C Summary ? ?Patient Details  ?Name: Maria Wiley ?MRN: [id number] ?Date of Birth: 01/02/1976 ?Referring Provider (PT): Melvenia Beam, MD ? ?PHYSICAL THERAPY DISCHARGE SUMMARY ? ?Visits from Start of Care: 5 ? ?Current functional level related to goals / functional outcomes: ?Met or partially met STG/LTG ?  ?Remaining deficits: ?Reports mild 0-1/10 dizziness with bed mobility and certain head movements ?  ?Education / Equipment: ?Updated HEP  ? ?Patient agrees to discharge. Patient goals were met. Patient is being discharged due to being pleased with the current functional level. ? ?Encounter Date: 12/01/2021 ? ? PT End of Session - 12/01/21 0802   ? ? Visit Number 5   ? Number of Visits 13   ? Date for PT Re-Evaluation 12/18/21   ? Authorization Type [facility]   ? Authorization - Visit Number 5   ? Authorization - Number of Visits 25   ? PT Start Time 0801   ? PT Stop Time 0845   ? PT Time Calculation (min) 44 min   ? Equipment Utilized During Treatment Gait belt   ? Activity Tolerance Patient tolerated treatment well   ? Behavior During Therapy [facility] for tasks assessed/performed   ? ?  ?  ? ?  ? ? ?Past Medical History:  ?Diagnosis Date  ? Ankylosing spondylitis ([place])   ? Chest pain   ? COVID-19 06/2020  ? GERD (gastroesophageal reflux disease)   ? Hypertension   ? Migraines   ? RA (rheumatoid arthritis) ([place])   ? Thyroid disease   ? Urinary incontinence   ? ? ?Past Surgical History:  ?Procedure Laterality Date  ? ABDOMINAL HYSTERECTOMY    ? APPENDECTOMY    ? CHOLECYSTECTOMY    ? HERNIA REPAIR  2004  ? KNEE SURGERY Right 2006  ? ? ?There were no vitals filed for this visit. ? ? Subjective Assessment - 12/01/21 0824   ? ? Subjective Pt notes continued improvement of symptoms with report of 1/10 with some maneuvers/bed mobility  but overall feeling 60% improved.   ? Pertinent History Amkylosing spondylitis, GERD, HTN, migraines, RA, R knee surgery   ? Diagnostic tests Recent MRI report showed: nonspecific white matter changes progressed since 2020 and moderately advanced for age but nonspecific cerebral white matter signal changes. There is underlying chronic volume loss in the posterior body of the corpus callosum   ? Patient Stated Goals improve dizziness   ? Currently in Pain? No/denies   ? ?  ?  ? ?  ? ? ? ? ? [facility] PT Assessment - 12/01/21 0001   ? ?  ? Standardized Balance Assessment  ? Standardized Balance Assessment Dynamic Gait Index   ?  ? Dynamic Gait Index  ? Level Surface Normal   ? Change in Gait Speed Normal   ? Gait with Horizontal Head Turns Normal   ? Gait with Vertical Head Turns Normal   ? Gait and Pivot Turn Normal   ? Step Over Obstacle Normal   ? Step Around Obstacles Normal   ? Steps Normal   ? Total Score 24   ?  ? High Level Balance  ? High Level Balance Comments M-CTSIB: EO/firm: WNL, EC/firm: mild sway, EO/foam: WNL, EC/foam: mild sway   ? ?  ?  ? ?  ? ? ? ? ? ? ? ? ? ? ? ? ? ? ? ? ?  Vestibular Treatment/Exercise - 12/01/21 0001   ? ?  ? Standing Horizontal Head Turns  ? Number of Reps  4   ? Symptom Description  walking VORx1 forwrad/backward   ?  ? Standing Vertical Head Turns  ? Number of Reps  4   ? Symptom Description  walking VOR x 1 forward/backward   ?  ? X1 Viewing Horizontal  ? Foot Position sitting, standing, stand on foam   ? Time 0001   ? Comments eyes only, then VOR x 1 for 3 trials x 30 sec cues for increased frequency.   ?  ? X1 Viewing Vertical  ? Foot Position sitting, standing, stand on foam   ? Time 0001   ? Comments eyes only and then VOR x 1 each condition. 30 sec intervals 0-1/10 dizziness with this   ?  ? X2 Viewing Horizontal  ? Foot Position standing   ? Reps 2   ?  ? X2 Viewing Vertical  ? Foot Position standing   ? Reps 2   ? Comments 30 sec   ? ?  ?  ? ?  ? ? ? ? ? ? ? ? ? PT Education  - 12/01/21 0835   ? ? Education Details progression of HEP for VOR x 2 horizontal/vertical + walking   ? Person(s) Educated Patient   ? Methods Explanation;Demonstration   ? Comprehension Verbalized understanding   ? ?  ?  ? ?  ? ? ? PT Short Term Goals - 12/01/21 0837   ? ?  ? PT SHORT TERM GOAL #1  ? Title Patient to be independent with initial HEP.   ? Time 3   ? Period Weeks   ? Status Achieved   ? Target Date 11/27/21   ? ?  ?  ? ?  ? ? ? ? PT Long Term Goals - 12/01/21 0837   ? ?  ? PT LONG TERM GOAL #1  ? Title Patient to be independent with advanced HEP.   ? Time 6   ? Period Weeks   ? Status Achieved   ? Target Date 12/18/21   ?  ? PT LONG TERM GOAL #2  ? Title Patient to report 0/10 dizziness with standing vertical and horizontal VOR for 60 seconds.   ? Baseline 0-1/10   ? Time 6   ? Period Weeks   ? Status Achieved   ? Target Date 12/18/21   ?  ? PT LONG TERM GOAL #3  ? Title Patient will report 0/10 dizziness with bed mobility.   ? Time 6   ? Period Weeks   ? Status Achieved   ? Target Date 12/18/21   ?  ? PT LONG TERM GOAL #4  ? Title Patient to score at least 20/24 on DGI in order to decrease risk of falls.   ? Baseline 24/24 at present from 20/24 initial   ? Time 6   ? Period Weeks   ? Status Achieved   ? Target Date 12/18/21   ?  ? PT LONG TERM GOAL #5  ? Title Patient to report 75% improvement in dizziness.   ? Baseline 60% improvement with repor of continued progress   ? Time 6   ? Period Weeks   ? Status Partially Met   ? Target Date 12/18/21   ? ?  ?  ? ?  ? ? ? ? ? ? ? ? Plan - 12/01/21 0836   ? ?  Clinical Impression Statement Pt notes overall improvement of 60% and reports 1/10 dizziness on occasion with position change and certain head movements. Pt reports she feels confident and comfortable in progressing her activities at home and demonstrates independence with HEP. Goals met or partially met, ready for D/C to HEP.   ? Comorbidities Amkylosing spondylitis, GERD, HTN, migraines, RA, R knee  surgery   ? Examination-Participation Restrictions [employer];Interpersonal Relationship;Shop;Driving;Community Activity;Cleaning;Occupation;Church;Meal Prep   ? PT Treatment/Interventions ADLs/Self Care Home Management;Canalith Repostioning;Cryotherapy;Moist Heat;Neuromuscular re-education;Balance training;Therapeutic exercise;Therapeutic activities;Functional mobility training;Stair training;Gait training;Patient/family education;Manual techniques;Dry needling;Passive range of motion;Taping;Vestibular   ? PT Next Visit Plan progress VOR and habituation training; balance activities with EC and turns   ? PT Home Exercise Plan walking VOR x 1, VOR x 2   ? Consulted and Agree with Plan of Care Patient   ? ?  ?  ? ?  ? ? ?Patient will benefit from skilled therapeutic intervention in order to improve the following deficits and impairments:  Dizziness, Decreased activity tolerance, Decreased balance ? ?Visit Diagnosis: ?Unsteadiness on feet ? ?Dizziness and giddiness ? ? ? ? ?Problem List ?Patient Active Problem List  ? Diagnosis Date Noted  ? Dizziness 09/26/2021  ? Vision loss 09/26/2021  ? B12 deficiency 09/26/2021  ? Overweight 08/20/2021  ? Numbness and tingling 02/07/2021  ? Chronic migraine without aura without status migrainosus, not intractable 05/10/2019  ? Allergic rhinitis 05/08/2019  ? Essential hypertension 03/10/2019  ? Attention and concentration deficit 01/24/2019  ? Ankylosing spondylitis ([place]) 01/15/2018  ? Routine general medical examination at a health care facility 01/15/2018  ? IBS (irritable bowel syndrome) 01/15/2018  ? RA (rheumatoid arthritis) ([place])   ? Hypothyroidism   ? ? ?Toniann Fail, PT ?12/01/2021, 8:38 AM ? ?[facility] ?[address], [address] ?[place], [place], [address] ?Phone: [phone]   Fax:  [phone] ? ?Name: Maria Wiley ?MRN: [id number] ?Date of Birth: June 06, 1976 ? ? ? ?

## 2021-12-02 ENCOUNTER — Other Ambulatory Visit (HOSPITAL_COMMUNITY): Payer: Self-pay

## 2021-12-04 ENCOUNTER — Encounter: Payer: Self-pay | Admitting: Internal Medicine

## 2021-12-04 ENCOUNTER — Other Ambulatory Visit (HOSPITAL_COMMUNITY): Payer: Self-pay

## 2021-12-04 ENCOUNTER — Ambulatory Visit: Payer: No Typology Code available for payment source

## 2021-12-05 ENCOUNTER — Other Ambulatory Visit (HOSPITAL_COMMUNITY): Payer: Self-pay

## 2021-12-05 MED ORDER — SEMAGLUTIDE-WEIGHT MANAGEMENT 1.7 MG/0.75ML ~~LOC~~ SOAJ
1.7000 mg | SUBCUTANEOUS | 0 refills | Status: AC
  Filled 2021-12-05 – 2022-01-29 (×2): qty 3, 28d supply, fill #0

## 2021-12-05 MED ORDER — SEMAGLUTIDE-WEIGHT MANAGEMENT 1 MG/0.5ML ~~LOC~~ SOAJ
1.0000 mg | SUBCUTANEOUS | 0 refills | Status: AC
  Filled 2021-12-05 – 2021-12-10 (×2): qty 2, 28d supply, fill #0

## 2021-12-05 MED ORDER — SEMAGLUTIDE-WEIGHT MANAGEMENT 2.4 MG/0.75ML ~~LOC~~ SOAJ
2.4000 mg | SUBCUTANEOUS | 0 refills | Status: DC
  Filled 2021-12-05 – 2022-03-13 (×2): qty 3, 28d supply, fill #0

## 2021-12-10 ENCOUNTER — Other Ambulatory Visit (HOSPITAL_COMMUNITY): Payer: Self-pay

## 2021-12-17 ENCOUNTER — Ambulatory Visit
Admission: RE | Admit: 2021-12-17 | Discharge: 2021-12-17 | Disposition: A | Discharge status: Home / Self Care | Payer: No Typology Code available for payment source | Source: Ambulatory Visit | Attending: Neurology | Admitting: Neurology

## 2021-12-17 VITALS — BP 137/80 | HR 74

## 2021-12-17 DIAGNOSIS — R9082 White matter disease, unspecified: Secondary | ICD-10-CM

## 2021-12-17 DIAGNOSIS — R42 Dizziness and giddiness: Secondary | ICD-10-CM

## 2021-12-17 DIAGNOSIS — H547 Unspecified visual loss: Secondary | ICD-10-CM

## 2021-12-17 NOTE — Discharge Instructions (Signed)

## 2021-12-17 NOTE — Progress Notes (Signed)
1 vial of blood drawn from pts RAC to be sent off with LP lab work. 1 succesful attempt. Pt tolerated well. Gauze and tape applied after.  ?

## 2021-12-19 ENCOUNTER — Telehealth: Payer: Self-pay | Admitting: Neurology

## 2021-12-19 ENCOUNTER — Telehealth: Payer: Self-pay

## 2021-12-19 ENCOUNTER — Other Ambulatory Visit (HOSPITAL_COMMUNITY): Payer: Self-pay

## 2021-12-19 DIAGNOSIS — G971 Other reaction to spinal and lumbar puncture: Secondary | ICD-10-CM

## 2021-12-19 MED ORDER — BUTALBITAL-APAP-CAFFEINE 50-325-40 MG PO TABS
1.0000 | ORAL_TABLET | Freq: Four times a day (QID) | ORAL | 0 refills | Status: DC | PRN
  Filled 2021-12-19: qty 15, 4d supply, fill #0

## 2021-12-19 NOTE — Telephone Encounter (Signed)
Patient called the on call service line complaining of worsening postural headaches since the lumbar puncture on 5/17. She did reach out to radiology and was directed to reach out to Dr. Lucia Gaskins for possible blood patch on Monday if headaches not improved.  I advised her to lay flat for the next 3 days, drink plenty of water, caffeine and I will prescribed her a few tablets of Fioricet. She will call us early Monday to update [place] about her headaches and if needed, we will arrange for blood patch.  She voices understanding.   Dr. Teresa Coombs

## 2021-12-19 NOTE — Telephone Encounter (Signed)
Patient called to report positional headache with nausea and vomiting whenever she is upright since having an LP here 12/17/21.  She states she is drinking "a lot" of caffeine and lying down when she can, but she is unable to lie down for 24 hours to try to get rid of this probable spinal headache.  I suggested she reach out to Dr. Lucia GaskinsAhern for an Epidural Blood Patch order in case she still has this headache on Monday.  I explained this procedure to her, mentioning our request that she lie down for 24 hours, or hopefully the rest of that day, after an EBP.  She stated she will call Dr. Lucia GaskinsAhern now and will also try to lie down more over the weekend.  She had asked me if there was any medication she could take for her symptoms; she will ask Dr. Lucia GaskinsAhern about this.

## 2021-12-22 ENCOUNTER — Other Ambulatory Visit: Payer: Self-pay | Admitting: Neurology

## 2021-12-22 ENCOUNTER — Encounter: Payer: Self-pay | Admitting: Neurology

## 2021-12-22 ENCOUNTER — Ambulatory Visit
Admission: RE | Admit: 2021-12-22 | Discharge: 2021-12-22 | Disposition: A | Discharge status: Home / Self Care | Payer: No Typology Code available for payment source | Source: Ambulatory Visit | Attending: Neurology | Admitting: Neurology

## 2021-12-22 DIAGNOSIS — G971 Other reaction to spinal and lumbar puncture: Secondary | ICD-10-CM

## 2021-12-22 NOTE — Telephone Encounter (Signed)
Spoke with patient. Placed order for blood patch.  Patient reports persistent frontal headache associated with nausea which increases when her headache is more severe.  She does report improvement in symptoms when she lays down.  She has been drinking plenty of caffeine and laying flat.  Fioricet makes her sleep so she has taken that.  She called out of work today.  She will be on the look out for a call from [facility] imaging to schedule the blood patch.   Called Kathy @ GI and LVM advising order has been placed for blood patch. Left call back number if needed.

## 2021-12-22 NOTE — Discharge Instructions (Signed)

## 2021-12-22 NOTE — Telephone Encounter (Signed)
Pt is asking that an order be put in for a blood patch due to still having horrible headaches.

## 2021-12-22 NOTE — Addendum Note (Signed)
Addended by: Bertram SavinULBERTSON, Finian Helvey L on: 12/22/2021 08:51 AM   Modules accepted: Orders

## 2021-12-24 ENCOUNTER — Other Ambulatory Visit (HOSPITAL_COMMUNITY): Payer: Self-pay

## 2021-12-24 LAB — CSF CELL COUNT WITH DIFFERENTIAL
RBC Count, CSF: 5 cells/uL — ABNORMAL HIGH
TOTAL NUCLEATED CELL: 2 cells/uL (ref 0–5)

## 2021-12-24 LAB — CNS IGG SYNTHESIS RATE, CSF+BLOOD
Albumin Serum: 3.9 g/dL (ref 3.6–5.1)
Albumin, CSF: 13.1 mg/dL (ref 8.0–42.0)
CNS-IgG Synthesis Rate: 2.9 mg/24 h (ref <=3.3)
IgG (Immunoglobin G), Serum: 1320 mg/dL (ref 600–1640)
IgG Total CSF: 3.6 mg/dL (ref 0.8–7.7)
IgG-Index: 0.81 — ABNORMAL HIGH (ref <0.70)

## 2021-12-24 LAB — OLIGOCLONAL BANDS, CSF + SERM

## 2021-12-24 LAB — GLUCOSE, CSF: Glucose, CSF: 54 mg/dL (ref 40–80)

## 2021-12-24 LAB — PROTEIN, CSF: Total Protein, CSF: 27 mg/dL (ref 15–45)

## 2021-12-24 MED ORDER — DICLOFENAC SODIUM 75 MG PO TBEC
75.0000 mg | DELAYED_RELEASE_TABLET | Freq: Two times a day (BID) | ORAL | 2 refills | Status: DC
  Filled 2021-12-24: qty 60, 30d supply, fill #0
  Filled 2022-01-09 – 2022-01-29 (×2): qty 60, 30d supply, fill #1
  Filled 2022-02-16: qty 60, 30d supply, fill #2

## 2021-12-24 MED ORDER — LEFLUNOMIDE 20 MG PO TABS
20.0000 mg | ORAL_TABLET | Freq: Every day | ORAL | 1 refills | Status: DC
  Filled 2021-12-24: qty 90, 90d supply, fill #0
  Filled 2022-03-30: qty 90, 90d supply, fill #1

## 2021-12-24 NOTE — Telephone Encounter (Signed)
I called GI [phone] and LMVM to return call concerning oligoclonal band results for this pt.  Please call back.

## 2021-12-25 ENCOUNTER — Other Ambulatory Visit (HOSPITAL_COMMUNITY): Payer: Self-pay

## 2021-12-25 ENCOUNTER — Other Ambulatory Visit: Payer: Self-pay | Admitting: Internal Medicine

## 2021-12-25 MED ORDER — SOLIFENACIN SUCCINATE 10 MG PO TABS
10.0000 mg | ORAL_TABLET | Freq: Every day | ORAL | 0 refills | Status: DC
  Filled 2021-12-25: qty 90, 90d supply, fill #0

## 2021-12-25 NOTE — Telephone Encounter (Signed)
ALL results back oligoclonal bands is now back.

## 2021-12-26 ENCOUNTER — Other Ambulatory Visit (HOSPITAL_COMMUNITY): Payer: Self-pay

## 2021-12-30 ENCOUNTER — Other Ambulatory Visit: Payer: Self-pay | Admitting: Neurology

## 2021-12-30 ENCOUNTER — Ambulatory Visit (HOSPITAL_COMMUNITY): Payer: No Typology Code available for payment source

## 2021-12-30 DIAGNOSIS — G35 Multiple sclerosis: Secondary | ICD-10-CM

## 2021-12-30 DIAGNOSIS — G379 Demyelinating disease of central nervous system, unspecified: Secondary | ICD-10-CM

## 2021-12-31 ENCOUNTER — Telehealth: Payer: Self-pay | Admitting: Neurology

## 2021-12-31 NOTE — Telephone Encounter (Signed)
Referral for Neurology sent to [facility] [phone]. 

## 2022-01-02 ENCOUNTER — Other Ambulatory Visit (HOSPITAL_COMMUNITY): Payer: Self-pay

## 2022-01-07 ENCOUNTER — Other Ambulatory Visit (HOSPITAL_COMMUNITY): Payer: Self-pay

## 2022-01-07 MED ORDER — AZELASTINE HCL 0.1 % NA SOLN
1.0000 | Freq: Two times a day (BID) | NASAL | 3 refills | Status: AC
Start: 2022-01-07 — End: ?
  Filled 2022-01-07: qty 30, 25d supply, fill #0

## 2022-01-08 ENCOUNTER — Other Ambulatory Visit (HOSPITAL_COMMUNITY): Payer: Self-pay

## 2022-01-09 ENCOUNTER — Other Ambulatory Visit: Payer: Self-pay | Admitting: Internal Medicine

## 2022-01-09 ENCOUNTER — Other Ambulatory Visit (HOSPITAL_COMMUNITY): Payer: Self-pay

## 2022-01-09 MED ORDER — ESCITALOPRAM OXALATE 20 MG PO TABS
20.0000 mg | ORAL_TABLET | Freq: Every day | ORAL | 0 refills | Status: DC
  Filled 2022-01-09 – 2022-02-16 (×2): qty 90, 90d supply, fill #0

## 2022-01-09 MED ORDER — AMLODIPINE BESYLATE 5 MG PO TABS
5.0000 mg | ORAL_TABLET | Freq: Every day | ORAL | 0 refills | Status: DC
Start: 2022-01-09 — End: 2022-05-07
  Filled 2022-01-09 – 2022-01-29 (×2): qty 90, 90d supply, fill #0

## 2022-01-27 ENCOUNTER — Other Ambulatory Visit (HOSPITAL_COMMUNITY): Payer: Self-pay

## 2022-01-27 ENCOUNTER — Encounter (HOSPITAL_COMMUNITY): Payer: Self-pay

## 2022-01-27 ENCOUNTER — Ambulatory Visit (HOSPITAL_COMMUNITY)
Admission: EM | Admit: 2022-01-27 | Discharge: 2022-01-27 | Disposition: A | Discharge status: Home / Self Care | Payer: No Typology Code available for payment source | Attending: Family Medicine | Admitting: Family Medicine

## 2022-01-27 DIAGNOSIS — H9201 Otalgia, right ear: Secondary | ICD-10-CM

## 2022-01-27 DIAGNOSIS — H6981 Other specified disorders of Eustachian tube, right ear: Secondary | ICD-10-CM

## 2022-01-27 HISTORY — DX: Multiple sclerosis, unspecified: G35.D

## 2022-01-27 HISTORY — DX: Multiple sclerosis: G35

## 2022-01-27 MED ORDER — PREDNISONE 20 MG PO TABS
40.0000 mg | ORAL_TABLET | Freq: Every day | ORAL | 0 refills | Status: DC
  Filled 2022-01-27: qty 10, 5d supply, fill #0

## 2022-01-28 ENCOUNTER — Ambulatory Visit
Admit: 2022-01-28 | Discharge: 2022-01-28 | Discharge status: Absent without leave | Payer: No Typology Code available for payment source

## 2022-01-28 ENCOUNTER — Ambulatory Visit: Payer: No Typology Code available for payment source

## 2022-01-29 ENCOUNTER — Ambulatory Visit: Payer: No Typology Code available for payment source

## 2022-01-29 ENCOUNTER — Other Ambulatory Visit (HOSPITAL_COMMUNITY): Payer: Self-pay

## 2022-02-16 ENCOUNTER — Other Ambulatory Visit: Payer: Self-pay | Admitting: Internal Medicine

## 2022-02-16 ENCOUNTER — Ambulatory Visit
Admission: RE | Admit: 2022-02-16 | Discharge: 2022-02-16 | Disposition: A | Discharge status: Home / Self Care | Payer: No Typology Code available for payment source | Source: Ambulatory Visit | Attending: Neurology | Admitting: Neurology

## 2022-02-16 ENCOUNTER — Other Ambulatory Visit (HOSPITAL_COMMUNITY): Payer: Self-pay

## 2022-02-16 DIAGNOSIS — G379 Demyelinating disease of central nervous system, unspecified: Secondary | ICD-10-CM

## 2022-02-16 DIAGNOSIS — G35 Multiple sclerosis: Secondary | ICD-10-CM

## 2022-02-16 MED ORDER — GADOBENATE DIMEGLUMINE 529 MG/ML IV SOLN
18.0000 mL | Freq: Once | INTRAVENOUS | Status: AC | PRN
  Administered 2022-02-16: 18 mL via INTRAVENOUS

## 2022-02-17 ENCOUNTER — Other Ambulatory Visit (HOSPITAL_COMMUNITY): Payer: Self-pay

## 2022-02-17 MED ORDER — MONTELUKAST SODIUM 10 MG PO TABS
10.0000 mg | ORAL_TABLET | Freq: Every day | ORAL | 9 refills | Status: DC
  Filled 2022-02-17: qty 30, 30d supply, fill #0
  Filled 2022-03-30: qty 30, 30d supply, fill #1
  Filled 2022-05-07: qty 30, 30d supply, fill #2
  Filled 2022-06-03: qty 30, 30d supply, fill #3
  Filled 2022-07-01: qty 30, 30d supply, fill #4
  Filled 2022-08-02: qty 30, 30d supply, fill #5
  Filled 2022-09-05 – 2022-09-07 (×2): qty 30, 30d supply, fill #6
  Filled 2022-10-17: qty 30, 30d supply, fill #7
  Filled 2022-11-24: qty 30, 30d supply, fill #8
  Filled 2022-12-17: qty 30, 30d supply, fill #9

## 2022-02-17 NOTE — Telephone Encounter (Signed)
   Notes to clinic:  Not a pt in this practice.      Requested Prescriptions  Pending Prescriptions Disp Refills   etanercept (ENBREL SURECLICK) 50 MG/ML injection 4 mL 2    Sig: Inject 58ml into the skin once weekly     Not Delegated - Immunology: Tumor Necrosis Factor Blockers Failed - 02/17/2022  5:44 PM      Failed - This refill cannot be delegated      Failed - Patient should have a PPD Skin Test annually.      Failed - AST in normal range and within 180 days    AST  Date Value Ref Range Status  08/19/2021 12 0 - 37 U/L Final         Failed - ALT in normal range and within 180 days    ALT  Date Value Ref Range Status  08/19/2021 7 0 - 35 U/L Final         Failed - HCT in normal range and within 180 days    HCT  Date Value Ref Range Status  09/26/2021 32.0 (L) 36.0 - 46.0 % Final         Failed - HGB in normal range and within 180 days    Hemoglobin  Date Value Ref Range Status  09/26/2021 10.2 (L) 12.0 - 15.0 g/dL Final         Passed - PLT in normal range and within 180 days    Platelets  Date Value Ref Range Status  09/26/2021 242.0 150.0 - 400.0 K/uL Final         Passed - WBC in normal range and within 180 days    WBC  Date Value Ref Range Status  09/26/2021 7.1 4.0 - 10.5 K/uL Final         Passed - Valid encounter within last 6 months    Recent Outpatient Visits           4 months ago Encounter for medication review   [facility] Lois Huxley, Cornelius Moras, RPH-CPP   1 year ago Encounter for medication review   [facility] Lois Huxley, Cornelius Moras, RPH-CPP   2 years ago Encounter for medication review   [facility] Drucilla Chalet, RPH-CPP

## 2022-02-18 ENCOUNTER — Other Ambulatory Visit (HOSPITAL_COMMUNITY): Payer: Self-pay

## 2022-02-18 MED ORDER — DICLOFENAC SODIUM 75 MG PO TBEC
75.0000 mg | DELAYED_RELEASE_TABLET | Freq: Two times a day (BID) | ORAL | 2 refills | Status: DC
  Filled 2022-05-07: qty 60, 30d supply, fill #0
  Filled 2022-06-03: qty 60, 30d supply, fill #1
  Filled 2022-07-01: qty 60, 30d supply, fill #2

## 2022-02-23 ENCOUNTER — Other Ambulatory Visit (HOSPITAL_COMMUNITY): Payer: Self-pay

## 2022-02-26 ENCOUNTER — Other Ambulatory Visit (HOSPITAL_COMMUNITY): Payer: Self-pay

## 2022-02-26 ENCOUNTER — Other Ambulatory Visit: Payer: Self-pay | Admitting: Internal Medicine

## 2022-03-03 ENCOUNTER — Other Ambulatory Visit (HOSPITAL_COMMUNITY): Payer: Self-pay

## 2022-03-04 ENCOUNTER — Other Ambulatory Visit (HOSPITAL_COMMUNITY): Payer: Self-pay

## 2022-03-06 ENCOUNTER — Other Ambulatory Visit (HOSPITAL_COMMUNITY): Payer: Self-pay

## 2022-03-09 ENCOUNTER — Other Ambulatory Visit: Payer: Self-pay | Admitting: Internal Medicine

## 2022-03-10 ENCOUNTER — Other Ambulatory Visit (HOSPITAL_COMMUNITY): Payer: Self-pay

## 2022-03-13 ENCOUNTER — Other Ambulatory Visit (HOSPITAL_COMMUNITY): Payer: Self-pay

## 2022-03-16 ENCOUNTER — Other Ambulatory Visit (HOSPITAL_COMMUNITY): Payer: Self-pay

## 2022-03-23 ENCOUNTER — Other Ambulatory Visit (HOSPITAL_COMMUNITY): Payer: Self-pay

## 2022-03-30 ENCOUNTER — Other Ambulatory Visit (HOSPITAL_COMMUNITY): Payer: Self-pay

## 2022-03-31 ENCOUNTER — Ambulatory Visit
Admission: EM | Admit: 2022-03-31 | Discharge: 2022-03-31 | Disposition: A | Discharge status: Home / Self Care | Payer: No Typology Code available for payment source | Attending: Family Medicine | Admitting: Family Medicine

## 2022-03-31 DIAGNOSIS — M069 Rheumatoid arthritis, unspecified: Secondary | ICD-10-CM

## 2022-03-31 MED ORDER — DEXAMETHASONE SODIUM PHOSPHATE 10 MG/ML IJ SOLN
10.0000 mg | Freq: Once | INTRAMUSCULAR | Status: AC
  Administered 2022-03-31: 10 mg via INTRAMUSCULAR

## 2022-03-31 NOTE — ED Triage Notes (Signed)
Triage by Provider.  

## 2022-03-31 NOTE — ED Provider Notes (Signed)
[name]    CSN: [id number] Arrival date & time: 03/31/22  1137      History   Chief Complaint Chief Complaint  Patient presents with   RA flare    HPI Maria Wiley is a 46 y.o. female.   HPI Patient with a history RA, MS,a nd ankylosing spondylitis presetn   Past Medical History:  Diagnosis Date   Ankylosing spondylitis ([facility])    Chest pain    COVID-19 06/2020   GERD (gastroesophageal reflux disease)    Hypertension    Migraines    Multiple sclerosis ([facility])    RA (rheumatoid arthritis) ([facility])    Thyroid disease    Urinary incontinence     Patient Active Problem List   Diagnosis Date Noted   Dizziness 09/26/2021   Vision loss 09/26/2021   B12 deficiency 09/26/2021   Overweight 08/20/2021   Numbness and tingling 02/07/2021   Chronic migraine without aura without status migrainosus, not intractable 05/10/2019   Allergic rhinitis 05/08/2019   Essential hypertension 03/10/2019   Attention and concentration deficit 01/24/2019   Ankylosing spondylitis ([facility]) 01/15/2018   Routine general medical examination at a health care facility 01/15/2018   IBS (irritable bowel syndrome) 01/15/2018   RA (rheumatoid arthritis) ([facility])    Hypothyroidism     Past Surgical History:  Procedure Laterality Date   ABDOMINAL HYSTERECTOMY     APPENDECTOMY     CHOLECYSTECTOMY     HERNIA REPAIR  2004   KNEE SURGERY Right 2006    OB History   No obstetric history on file.      Home Medications    Prior to Admission medications   Medication Sig Start Date End Date Taking? Authorizing Provider  amLODipine (NORVASC) 5 MG tablet Take 1 tablet (5 mg total) by mouth daily. 01/09/22   Myrlene Broker, MD  azelastine (ASTELIN) 0.1 % nasal spray Place 1-2 sprays into both nostrils 2 (two) times daily. 01/07/22     butalbital-acetaminophen-caffeine (FIORICET) 50-325-40 MG tablet Take 1 tablet by mouth every 6 (six) hours as needed for headache. 12/19/21   Windell Norfolk,  MD  cetirizine (ZYRTEC ALLERGY) 10 MG tablet Take 1 tablet (10 mg total) by mouth daily. 08/27/21   Wallis Bamberg, PA-C  cholecalciferol (VITAMIN D) 1000 units tablet Take 1,000 Units by mouth daily.    [provider]  diclofenac (VOLTAREN) 75 MG EC tablet Take 1 tablet (75 mg total) by mouth 2 (two) times daily for joint/back pain. 11/11/21   Myrlene Broker, MD  diclofenac (VOLTAREN) 75 MG EC tablet Take 1 tablet (75 mg total) by mouth 2 (two) times daily for joint/back pain 12/24/21     diclofenac (VOLTAREN) 75 MG EC tablet Take 1 tablet (75 mg total) by mouth 2 (two) times daily for joint/back pain 02/18/22     escitalopram (LEXAPRO) 20 MG tablet Take 1 tablet (20 mg total) by mouth daily. 01/09/22   Myrlene Broker, MD  etanercept (ENBREL SURECLICK) 50 MG/ML injection Inject 63ml into the skin once weekly 09/30/21   Quentin Angst, MD  folic acid (FOLVITE) 1 MG tablet Take 1 tablet (1 mg total) by mouth daily. 01/14/18   Myrlene Broker, MD  leflunomide (ARAVA) 20 MG tablet Take 1 tablet (20 mg total) by mouth daily. 12/24/21     levothyroxine (SYNTHROID) 25 MCG tablet Take 1 tablet (25 mcg total) by mouth daily before breakfast. 10/31/21 10/31/22  Myrlene Broker, MD  LINZESS 145  MCG CAPS capsule Take 145 mcg by mouth daily as needed.  07/25/17   [provider]  Magnesium 250 MG TABS Take 250 mg by mouth daily.    [provider]  montelukast (SINGULAIR) 10 MG tablet Take 1 tablet (10 mg total) by mouth daily. 02/17/22     ondansetron (ZOFRAN) 4 MG tablet Take 1 tablet (4 mg total) by mouth every 8 (eight) hours as needed for nausea or vomiting. 11/12/18   Couture, Cortni S, PA-C  ondansetron (ZOFRAN-ODT) 4 MG disintegrating tablet Take 1 tablet (4 mg total) by mouth every 8 (eight) hours as needed for nausea. 05/10/19   Anson Fret, MD  predniSONE (DELTASONE) 20 MG tablet Take 2 tablets (40 mg total) by mouth daily. 01/27/22   Mardella Layman, MD   pseudoephedrine (SUDAFED) 60 MG tablet Take 1 tablet (60 mg total) by mouth every 8 (eight) hours as needed for congestion. 08/27/21   Wallis Bamberg, PA-C  rizatriptan (MAXALT-MLT) 10 MG disintegrating tablet Take 1 tablet (10 mg total) by mouth as needed for migraine. May repeat in 2 hours if needed 05/10/19   Anson Fret, MD  Semaglutide-Weight Management 2.4 MG/0.75ML SOAJ Inject 2.4 mg into the skin once a week for 28 days. 03/31/22 04/28/22  Myrlene Broker, MD  solifenacin (VESICARE) 10 MG tablet Take 1 tablet (10 mg total) by mouth daily. 12/25/21   Myrlene Broker, MD  valACYclovir (VALTREX) 1000 MG tablet Take 1 tablet (1,000 mg total) by mouth 2 (two) times daily. 02/01/20   Myrlene Broker, MD    Family History Family History  Problem Relation Age of Onset   CAD Maternal Grandfather    Diabetes Maternal Grandfather    Hearing loss Maternal Grandfather    Heart disease Maternal Grandfather    Hyperlipidemia Maternal Grandfather    Hypertension Mother    Arthritis Mother    Arthritis Maternal Grandmother    Heart disease Maternal Grandmother    Hyperlipidemia Maternal Grandmother    Migraines Neg Hx    Headache Neg Hx     Social History Social History   Tobacco Use   Smoking status: Never   Smokeless tobacco: Never  Vaping Use   Vaping Use: Never used  Substance Use Topics   Alcohol use: Never   Drug use: Never     Allergies   Patient has no known allergies.   Review of Systems Review of Systems   Physical Exam Triage Vital Signs ED Triage Vitals  Enc Vitals Group     BP      Pulse      Resp      Temp      Temp src      SpO2      Weight      Height      Head Circumference      Peak Flow      Pain Score      Pain Loc      Pain Edu?      Excl. in GC?    No data found.  Updated Vital Signs There were no vitals taken for this visit.  Visual Acuity Right Eye Distance:   Left Eye Distance:   Bilateral Distance:    Right Eye  Near:   Left Eye Near:    Bilateral Near:     Physical Exam   UC Treatments / Results  Labs (all labs ordered are listed, but only abnormal results are displayed)  Labs Reviewed - No data to display  EKG   Radiology No results found.  Procedures Procedures (including critical care time)  Medications Ordered in UC Medications  dexamethasone (DECADRON) injection 10 mg (10 mg Intramuscular Given by Other 03/31/22 1150)    Initial Impression / Assessment and Plan / UC Course  I have reviewed the triage vital signs and the nursing notes.  Pertinent labs & imaging results that were available during my care of the patient were reviewed by me and considered in my medical decision making (see chart for details).     *** Final Clinical Impressions(s) / UC Diagnoses   Final diagnoses:  None   Discharge Instructions   None    ED Prescriptions   None    PDMP not reviewed this encounter.

## 2022-04-03 ENCOUNTER — Other Ambulatory Visit (HOSPITAL_COMMUNITY): Payer: Self-pay

## 2022-04-09 ENCOUNTER — Other Ambulatory Visit (HOSPITAL_COMMUNITY): Payer: Self-pay

## 2022-04-09 MED ORDER — GABAPENTIN 300 MG PO CAPS
300.0000 mg | ORAL_CAPSULE | Freq: Every evening | ORAL | 6 refills | Status: DC | PRN
  Filled 2022-04-09: qty 30, 30d supply, fill #0
  Filled 2022-05-07: qty 30, 30d supply, fill #1
  Filled 2022-06-12: qty 30, 30d supply, fill #2
  Filled 2022-07-01 – 2022-11-24 (×2): qty 30, 30d supply, fill #3

## 2022-04-10 ENCOUNTER — Encounter: Payer: Self-pay | Admitting: Internal Medicine

## 2022-04-10 ENCOUNTER — Other Ambulatory Visit (HOSPITAL_COMMUNITY): Payer: Self-pay

## 2022-04-10 ENCOUNTER — Ambulatory Visit (INDEPENDENT_AMBULATORY_CARE_PROVIDER_SITE_OTHER): Payer: No Typology Code available for payment source | Admitting: Internal Medicine

## 2022-04-10 VITALS — BP 120/84 | HR 80 | Temp 98.2°F | Ht 67.0 in | Wt 199.2 lb

## 2022-04-10 DIAGNOSIS — E669 Obesity, unspecified: Secondary | ICD-10-CM | POA: Diagnosis not present

## 2022-04-10 DIAGNOSIS — Z6831 Body mass index (BMI) 31.0-31.9, adult: Secondary | ICD-10-CM

## 2022-04-10 DIAGNOSIS — Z23 Encounter for immunization: Secondary | ICD-10-CM | POA: Diagnosis not present

## 2022-04-10 DIAGNOSIS — H547 Unspecified visual loss: Secondary | ICD-10-CM

## 2022-04-10 MED ORDER — SEMAGLUTIDE-WEIGHT MANAGEMENT 2.4 MG/0.75ML ~~LOC~~ SOAJ
2.4000 mg | SUBCUTANEOUS | 5 refills | Status: DC
  Filled 2022-04-10: qty 3, 28d supply, fill #0
  Filled 2022-05-11: qty 3, 28d supply, fill #1
  Filled 2022-06-11 – 2022-07-06 (×2): qty 3, 28d supply, fill #2
  Filled 2022-08-02 – 2022-08-06 (×2): qty 3, 28d supply, fill #3
  Filled 2022-09-01: qty 3, 28d supply, fill #4
  Filled 2022-09-29: qty 3, 28d supply, fill #5

## 2022-04-10 NOTE — Patient Instructions (Signed)
We will give you the flu shot today.

## 2022-04-10 NOTE — Assessment & Plan Note (Signed)
Refill wegovy 2.4 mg weekly as this is helping with weight loss for her. Counseled about possible side effects.

## 2022-04-10 NOTE — Progress Notes (Signed)
   Subjective:   Patient ID: Maria Wiley, female    DOB: [birth year], 46 y.o.   MRN: [id number]  HPI The patient is a 46 YO female coming in for follow up. New likely diagnosis MS.  Review of Systems  Constitutional: Negative.   HENT: Negative.    Eyes:  Positive for visual disturbance.  Respiratory:  Negative for cough, chest tightness and shortness of breath.   Cardiovascular:  Negative for chest pain, palpitations and leg swelling.  Gastrointestinal:  Negative for abdominal distention, abdominal pain, constipation, diarrhea, nausea and vomiting.  Musculoskeletal: Negative.   Skin: Negative.   Neurological: Negative.   Psychiatric/Behavioral: Negative.      Objective:  Physical Exam Constitutional:      Appearance: She is well-developed.  HENT:     Head: Normocephalic and atraumatic.  Cardiovascular:     Rate and Rhythm: Normal rate and regular rhythm.  Pulmonary:     Effort: Pulmonary effort is normal. No respiratory distress.     Breath sounds: Normal breath sounds. No wheezing or rales.  Abdominal:     General: Bowel sounds are normal. There is no distension.     Palpations: Abdomen is soft.     Tenderness: There is no abdominal tenderness. There is no rebound.  Musculoskeletal:     Cervical back: Normal range of motion.  Skin:    General: Skin is warm and dry.  Neurological:     Mental Status: She is alert and oriented to person, place, and time.     Coordination: Coordination normal.     Vitals:   04/10/22 0834  BP: 120/84  Pulse: 80  Temp: 98.2 F (36.8 C)  TempSrc: Oral  SpO2: 96%  Weight: 199 lb 3.2 oz (90.4 kg)  Height: 5\' 7"  (1.702 m)    Assessment & Plan:  Flu shot given at visit

## 2022-04-10 NOTE — Assessment & Plan Note (Signed)
New likely diagnosis of MS seeing neurology through [facility].

## 2022-04-16 ENCOUNTER — Other Ambulatory Visit (HOSPITAL_COMMUNITY): Payer: Self-pay

## 2022-04-23 ENCOUNTER — Other Ambulatory Visit (HOSPITAL_COMMUNITY): Payer: Self-pay

## 2022-04-30 ENCOUNTER — Other Ambulatory Visit (HOSPITAL_COMMUNITY): Payer: Self-pay

## 2022-05-07 ENCOUNTER — Other Ambulatory Visit: Payer: Self-pay | Admitting: Internal Medicine

## 2022-05-07 ENCOUNTER — Other Ambulatory Visit (HOSPITAL_COMMUNITY): Payer: Self-pay

## 2022-05-07 MED ORDER — AMLODIPINE BESYLATE 5 MG PO TABS
5.0000 mg | ORAL_TABLET | Freq: Every day | ORAL | 0 refills | Status: DC
  Filled 2022-05-07: qty 90, 90d supply, fill #0

## 2022-05-07 MED ORDER — SOLIFENACIN SUCCINATE 10 MG PO TABS
10.0000 mg | ORAL_TABLET | Freq: Every day | ORAL | 0 refills | Status: DC
  Filled 2022-05-07: qty 90, 90d supply, fill #0

## 2022-05-08 ENCOUNTER — Other Ambulatory Visit (HOSPITAL_COMMUNITY): Payer: Self-pay

## 2022-05-08 MED ORDER — GABAPENTIN 300 MG PO CAPS
300.0000 mg | ORAL_CAPSULE | Freq: Every evening | ORAL | 6 refills | Status: DC | PRN
Start: 2022-05-07 — End: 2022-06-10
  Filled 2022-06-03 – 2022-06-05 (×2): qty 30, 30d supply, fill #0

## 2022-05-11 ENCOUNTER — Other Ambulatory Visit: Payer: Self-pay

## 2022-05-11 ENCOUNTER — Telehealth: Payer: Self-pay | Admitting: Pharmacy Technician

## 2022-05-11 ENCOUNTER — Other Ambulatory Visit (HOSPITAL_COMMUNITY): Payer: Self-pay

## 2022-05-11 DIAGNOSIS — G35 Multiple sclerosis: Secondary | ICD-10-CM | POA: Insufficient documentation

## 2022-05-11 NOTE — Telephone Encounter (Addendum)
Auth Submission: DENIED Payer: [employer] Medication & CPT/J Code(s) submitted: De Nurse Hoyt Koch) [phone] Route of submission (phone, fax, portal):  Phone # [phone] Fax # Auth type: Buy/Bill Units/visits requested: ocrevus iv every 2 wks q6 months Reference number:  Approval from:  to  at [place]   Denied due to frequency is not supported by medical  literature and other cost effective medications. Glatiramer Teriflunomide  MD has been informed of denial. Denial letter has been faxed. Dr. George Hugh Phone: [phone] Fax: [phone]

## 2022-05-12 ENCOUNTER — Other Ambulatory Visit: Payer: Self-pay | Admitting: Pharmacy Technician

## 2022-05-13 ENCOUNTER — Other Ambulatory Visit (HOSPITAL_COMMUNITY): Payer: Self-pay

## 2022-05-13 ENCOUNTER — Other Ambulatory Visit: Payer: Self-pay

## 2022-05-13 MED ORDER — GABAPENTIN 100 MG PO CAPS
100.0000 mg | ORAL_CAPSULE | Freq: Two times a day (BID) | ORAL | 6 refills | Status: DC | PRN
  Filled 2022-05-13: qty 60, 30d supply, fill #0

## 2022-05-17 ENCOUNTER — Other Ambulatory Visit: Payer: Self-pay | Admitting: Internal Medicine

## 2022-05-18 ENCOUNTER — Other Ambulatory Visit (HOSPITAL_COMMUNITY): Payer: Self-pay

## 2022-05-18 NOTE — Telephone Encounter (Signed)
Last refill -01/09/22 Please advise

## 2022-05-19 ENCOUNTER — Other Ambulatory Visit (HOSPITAL_COMMUNITY): Payer: Self-pay

## 2022-05-19 MED ORDER — ESCITALOPRAM OXALATE 20 MG PO TABS
20.0000 mg | ORAL_TABLET | Freq: Every day | ORAL | 3 refills | Status: DC
  Filled 2022-05-19: qty 90, 90d supply, fill #0
  Filled 2022-08-22: qty 90, 90d supply, fill #1
  Filled 2022-11-24: qty 90, 90d supply, fill #2
  Filled 2023-02-18: qty 90, 90d supply, fill #3

## 2022-05-28 ENCOUNTER — Other Ambulatory Visit (HOSPITAL_COMMUNITY): Payer: Self-pay

## 2022-05-28 MED ORDER — SULFASALAZINE 500 MG PO TABS
500.0000 mg | ORAL_TABLET | Freq: Two times a day (BID) | ORAL | 2 refills | Status: DC
  Filled 2022-05-28: qty 60, 30d supply, fill #0
  Filled 2022-07-01: qty 60, 30d supply, fill #1
  Filled 2022-08-02 – 2022-09-01 (×2): qty 60, 30d supply, fill #2

## 2022-05-29 ENCOUNTER — Other Ambulatory Visit (HOSPITAL_COMMUNITY): Payer: Self-pay

## 2022-06-01 ENCOUNTER — Other Ambulatory Visit: Payer: Self-pay | Admitting: Internal Medicine

## 2022-06-01 ENCOUNTER — Encounter: Payer: Self-pay | Admitting: Acute Care

## 2022-06-01 DIAGNOSIS — Z1231 Encounter for screening mammogram for malignant neoplasm of breast: Secondary | ICD-10-CM

## 2022-06-01 NOTE — Telephone Encounter (Addendum)
F/u: Ocrevus denial. Formulary list and MS policy has been requested for review from [employer] as recommended by Dollene [place]. Once information has been obtained, we may move forward with appeal.  Dr. George Hugh has been notified of denial. Spoke with Herbie Baltimore and letter has been faxed to MD office.  [employer] (rep [place]) has reached out in attempts to do peer to peer/appeal. MD phone/fax number was provided to rep. [place] phone: [phone]

## 2022-06-02 ENCOUNTER — Other Ambulatory Visit (HOSPITAL_COMMUNITY): Payer: Self-pay

## 2022-06-02 MED ORDER — COSENTYX SENSOREADY PEN 150 MG/ML ~~LOC~~ SOAJ
SUBCUTANEOUS | 5 refills | Status: DC
  Filled 2022-06-02: qty 1, 30d supply, fill #0
  Filled 2022-06-03: qty 1, fill #0

## 2022-06-03 ENCOUNTER — Other Ambulatory Visit (HOSPITAL_COMMUNITY): Payer: Self-pay

## 2022-06-04 ENCOUNTER — Other Ambulatory Visit (HOSPITAL_COMMUNITY): Payer: Self-pay

## 2022-06-04 ENCOUNTER — Ambulatory Visit: Payer: No Typology Code available for payment source | Attending: Internal Medicine | Admitting: Pharmacist

## 2022-06-04 DIAGNOSIS — Z79899 Other long term (current) drug therapy: Secondary | ICD-10-CM

## 2022-06-04 MED ORDER — COSENTYX SENSOREADY PEN 150 MG/ML ~~LOC~~ SOAJ
SUBCUTANEOUS | 5 refills | Status: DC
  Filled 2022-06-04 – 2022-06-09 (×2): qty 1, 30d supply, fill #0

## 2022-06-04 NOTE — Progress Notes (Signed)
   S: Patient presents today for review of their specialty medication.   Patient is changing from Enbrel to Cosentyx (secukinumab) for RA. Patient is managed by Leafy Kindle for this.   Dosing: RA: With a loading dose: 10 mg/kg IV at weeks 0,2, and 4 followed by 150 mg subq once every 4 weeks   Adherence: has not started yet   Efficacy: has not yet started   Current adverse effects: S/sx of infection: none  GI upset: none  Headache: none  S/sx of hypersensitivity: none    O:     Lab Results  Component Value Date   WBC 7.1 09/26/2021   HGB 10.2 (L) 09/26/2021   HCT 32.0 (L) 09/26/2021   MCV 79.8 09/26/2021   PLT 242.0 09/26/2021      Chemistry      Component Value Date/Time   NA 136 09/26/2021 1500   K 3.9 09/26/2021 1500   CL 103 09/26/2021 1500   CO2 31 09/26/2021 1500   BUN 13 09/26/2021 1500   CREATININE 0.80 09/26/2021 1500      Component Value Date/Time   CALCIUM 8.8 09/26/2021 1500   ALKPHOS 42 08/19/2021 1536   AST 12 08/19/2021 1536   ALT 7 08/19/2021 1536   BILITOT 0.4 08/19/2021 1536       A/P: 1. Medication review: Patient is about to start Cosentyx for RA. Reviewed the medication with the patient, including the following: Cosentyx is a monoclonal antibody used in the treatment of ankylosing spondylitis, psoriasis, and psoriatic arthritis. The injection is subq and the medication should be allowed to reach room temp prior to injecting. Injection sites should be rotated. Possible adverse effects include headaches, GI upset, increased risk of infection and hypersensitivity reactions. No recommendations for any changes at this time.   Benard Halsted, PharmD, [facility] [place], [employer] [phone]

## 2022-06-05 ENCOUNTER — Other Ambulatory Visit (HOSPITAL_COMMUNITY): Payer: Self-pay

## 2022-06-08 ENCOUNTER — Other Ambulatory Visit (HOSPITAL_COMMUNITY): Payer: Self-pay

## 2022-06-09 ENCOUNTER — Encounter: Payer: Self-pay | Admitting: Acute Care

## 2022-06-09 ENCOUNTER — Emergency Department (HOSPITAL_COMMUNITY): Payer: No Typology Code available for payment source

## 2022-06-09 ENCOUNTER — Observation Stay (HOSPITAL_COMMUNITY)
Admission: EM | Admit: 2022-06-09 | Discharge: 2022-06-10 | Disposition: A | Discharge status: Home / Self Care | Payer: No Typology Code available for payment source | Attending: Internal Medicine | Admitting: Internal Medicine

## 2022-06-09 ENCOUNTER — Other Ambulatory Visit (HOSPITAL_COMMUNITY): Payer: Self-pay

## 2022-06-09 ENCOUNTER — Other Ambulatory Visit: Payer: Self-pay

## 2022-06-09 DIAGNOSIS — R531 Weakness: Secondary | ICD-10-CM | POA: Diagnosis present

## 2022-06-09 DIAGNOSIS — R202 Paresthesia of skin: Secondary | ICD-10-CM | POA: Diagnosis not present

## 2022-06-09 DIAGNOSIS — I10 Essential (primary) hypertension: Secondary | ICD-10-CM | POA: Insufficient documentation

## 2022-06-09 DIAGNOSIS — Z7982 Long term (current) use of aspirin: Secondary | ICD-10-CM | POA: Diagnosis not present

## 2022-06-09 DIAGNOSIS — Z8616 Personal history of COVID-19: Secondary | ICD-10-CM | POA: Insufficient documentation

## 2022-06-09 DIAGNOSIS — Z79899 Other long term (current) drug therapy: Secondary | ICD-10-CM | POA: Insufficient documentation

## 2022-06-09 DIAGNOSIS — G35 Multiple sclerosis: Secondary | ICD-10-CM | POA: Insufficient documentation

## 2022-06-09 DIAGNOSIS — E039 Hypothyroidism, unspecified: Secondary | ICD-10-CM | POA: Insufficient documentation

## 2022-06-09 DIAGNOSIS — R519 Headache, unspecified: Secondary | ICD-10-CM | POA: Insufficient documentation

## 2022-06-09 DIAGNOSIS — G43409 Hemiplegic migraine, not intractable, without status migrainosus: Secondary | ICD-10-CM

## 2022-06-09 DIAGNOSIS — M4802 Spinal stenosis, cervical region: Secondary | ICD-10-CM | POA: Diagnosis not present

## 2022-06-09 DIAGNOSIS — G43909 Migraine, unspecified, not intractable, without status migrainosus: Secondary | ICD-10-CM | POA: Diagnosis not present

## 2022-06-09 LAB — URINALYSIS, ROUTINE W REFLEX MICROSCOPIC
Bilirubin Urine: NEGATIVE
Glucose, UA: NEGATIVE mg/dL
Hgb urine dipstick: NEGATIVE
Ketones, ur: NEGATIVE mg/dL
Leukocytes,Ua: NEGATIVE
Nitrite: NEGATIVE
Protein, ur: NEGATIVE mg/dL
Specific Gravity, Urine: 1.009 (ref 1.005–1.030)
pH: 6 (ref 5.0–8.0)

## 2022-06-09 LAB — COMPREHENSIVE METABOLIC PANEL
ALT: <5 U/L (ref 0–44)
AST: 15 U/L (ref 15–41)
Albumin: 3.9 g/dL (ref 3.5–5.0)
Alkaline Phosphatase: 48 U/L (ref 38–126)
Anion gap: 12 (ref 5–15)
BUN: 10 mg/dL (ref 6–20)
CO2: 20 mmol/L — ABNORMAL LOW (ref 22–32)
Calcium: 9.2 mg/dL (ref 8.9–10.3)
Chloride: 108 mmol/L (ref 98–111)
Creatinine, Ser: 0.68 mg/dL (ref 0.44–1.00)
GFR, Estimated: >60 mL/min (ref >60)
Glucose, Bld: 88 mg/dL (ref 70–99)
Potassium: 4 mmol/L (ref 3.5–5.1)
Sodium: 140 mmol/L (ref 135–145)
Total Bilirubin: 0.4 mg/dL (ref 0.3–1.2)
Total Protein: 7.2 g/dL (ref 6.5–8.1)

## 2022-06-09 LAB — CBC
HCT: 37.4 % (ref 36.0–46.0)
Hemoglobin: 11.6 g/dL — ABNORMAL LOW (ref 12.0–15.0)
MCH: 25.9 pg — ABNORMAL LOW (ref 26.0–34.0)
MCHC: 31 g/dL (ref 30.0–36.0)
MCV: 83.5 fL (ref 80.0–100.0)
Platelets: 255 10*3/uL (ref 150–400)
RBC: 4.48 MIL/uL (ref 3.87–5.11)
RDW: 13.4 % (ref 11.5–15.5)
WBC: 5.2 10*3/uL (ref 4.0–10.5)
nRBC: 0 % (ref 0.0–0.2)

## 2022-06-09 LAB — HIV ANTIBODY (ROUTINE TESTING W REFLEX): HIV Screen 4th Generation wRfx: NONREACTIVE

## 2022-06-09 LAB — DIFFERENTIAL
Abs Immature Granulocytes: 0 10*3/uL (ref 0.00–0.07)
Basophils Absolute: 0.1 10*3/uL (ref 0.0–0.1)
Basophils Relative: 1 %
Eosinophils Absolute: 0.1 10*3/uL (ref 0.0–0.5)
Eosinophils Relative: 2 %
Immature Granulocytes: 0 %
Lymphocytes Relative: 38 %
Lymphs Abs: 2 10*3/uL (ref 0.7–4.0)
Monocytes Absolute: 0.4 10*3/uL (ref 0.1–1.0)
Monocytes Relative: 8 %
Neutro Abs: 2.7 10*3/uL (ref 1.7–7.7)
Neutrophils Relative %: 51 %

## 2022-06-09 LAB — PROTIME-INR
INR: 1.2 (ref 0.8–1.2)
Prothrombin Time: 14.7 seconds (ref 11.4–15.2)

## 2022-06-09 LAB — I-STAT CHEM 8, ED
BUN: 9 mg/dL (ref 6–20)
Calcium, Ion: 1.11 mmol/L — ABNORMAL LOW (ref 1.15–1.40)
Chloride: 106 mmol/L (ref 98–111)
Creatinine, Ser: 0.6 mg/dL (ref 0.44–1.00)
Glucose, Bld: 89 mg/dL (ref 70–99)
HCT: 39 % (ref 36.0–46.0)
Hemoglobin: 13.3 g/dL (ref 12.0–15.0)
Potassium: 3.9 mmol/L (ref 3.5–5.1)
Sodium: 139 mmol/L (ref 135–145)
TCO2: 23 mmol/L (ref 22–32)

## 2022-06-09 LAB — I-STAT BETA HCG BLOOD, ED (MC, WL, AP ONLY): I-stat hCG, quantitative: <5 m[IU]/mL (ref <5)

## 2022-06-09 LAB — HEMOGLOBIN A1C
Hgb A1c MFr Bld: 5.1 % (ref 4.8–5.6)
Mean Plasma Glucose: 99.67 mg/dL

## 2022-06-09 LAB — RAPID URINE DRUG SCREEN, HOSP PERFORMED
Amphetamines: NOT DETECTED
Barbiturates: NOT DETECTED
Benzodiazepines: NOT DETECTED
Cocaine: NOT DETECTED
Opiates: NOT DETECTED
Tetrahydrocannabinol: NOT DETECTED

## 2022-06-09 LAB — TROPONIN I (HIGH SENSITIVITY)
Troponin I (High Sensitivity): <2 ng/L (ref <18)
Troponin I (High Sensitivity): 3 ng/L (ref <18)

## 2022-06-09 LAB — APTT: aPTT: 35 seconds (ref 24–36)

## 2022-06-09 MED ORDER — ACETAMINOPHEN 325 MG PO TABS: 650.0000 mg | ORAL_TABLET | ORAL | Status: DC | PRN

## 2022-06-09 MED ORDER — HALOPERIDOL LACTATE 5 MG/ML IJ SOLN
3.0000 mg | Freq: Once | INTRAMUSCULAR | Status: AC
  Administered 2022-06-09: 3 mg via INTRAVENOUS
  Filled 2022-06-09: qty 1

## 2022-06-09 MED ORDER — FESOTERODINE FUMARATE ER 4 MG PO TB24
4.0000 mg | ORAL_TABLET | Freq: Every day | ORAL | Status: DC
  Filled 2022-06-09: qty 1

## 2022-06-09 MED ORDER — LEVOTHYROXINE SODIUM 25 MCG PO TABS: 25.0000 ug | ORAL_TABLET | Freq: Every day | ORAL | Status: DC

## 2022-06-09 MED ORDER — ACETAMINOPHEN 650 MG RE SUPP: 650.0000 mg | RECTAL | Status: DC | PRN

## 2022-06-09 MED ORDER — SULFASALAZINE 500 MG PO TABS
500.0000 mg | ORAL_TABLET | Freq: Two times a day (BID) | ORAL | Status: DC
  Administered 2022-06-09: 500 mg via ORAL
  Filled 2022-06-09 (×2): qty 1

## 2022-06-09 MED ORDER — GADOBUTROL 1 MMOL/ML IV SOLN
10.0000 mL | Freq: Once | INTRAVENOUS | Status: AC | PRN
  Administered 2022-06-09: 10 mL via INTRAVENOUS

## 2022-06-09 MED ORDER — DICLOFENAC SODIUM 75 MG PO TBEC
75.0000 mg | DELAYED_RELEASE_TABLET | Freq: Two times a day (BID) | ORAL | Status: DC
  Administered 2022-06-09: 75 mg via ORAL
  Filled 2022-06-09 (×2): qty 1

## 2022-06-09 MED ORDER — FOLIC ACID 1 MG PO TABS: 1.0000 mg | ORAL_TABLET | Freq: Every day | ORAL | Status: DC

## 2022-06-09 MED ORDER — AMLODIPINE BESYLATE 5 MG PO TABS: 5.0000 mg | ORAL_TABLET | Freq: Every day | ORAL | Status: DC

## 2022-06-09 MED ORDER — ESCITALOPRAM OXALATE 10 MG PO TABS: 20.0000 mg | ORAL_TABLET | Freq: Every day | ORAL | Status: DC

## 2022-06-09 MED ORDER — MONTELUKAST SODIUM 10 MG PO TABS: 10.0000 mg | ORAL_TABLET | Freq: Every day | ORAL | Status: DC

## 2022-06-09 MED ORDER — BUTALBITAL-APAP-CAFFEINE 50-325-40 MG PO TABS: 1.0000 | ORAL_TABLET | Freq: Four times a day (QID) | ORAL | Status: DC | PRN

## 2022-06-09 MED ORDER — IOHEXOL 350 MG/ML SOLN
75.0000 mL | Freq: Once | INTRAVENOUS | Status: AC | PRN
  Administered 2022-06-09: 75 mL via INTRAVENOUS

## 2022-06-09 MED ORDER — GABAPENTIN 300 MG PO CAPS: 300.0000 mg | ORAL_CAPSULE | Freq: Every evening | ORAL | Status: DC | PRN

## 2022-06-09 MED ORDER — ASPIRIN 81 MG PO TBEC
81.0000 mg | DELAYED_RELEASE_TABLET | Freq: Every day | ORAL | Status: DC
  Administered 2022-06-09: 81 mg via ORAL
  Filled 2022-06-09: qty 1

## 2022-06-09 MED ORDER — ACETAMINOPHEN 160 MG/5ML PO SOLN: 650.0000 mg | ORAL | Status: DC | PRN

## 2022-06-09 MED ORDER — MAGNESIUM SULFATE 2 GM/50ML IV SOLN
2.0000 g | Freq: Once | INTRAVENOUS | Status: AC
  Administered 2022-06-09: 2 g via INTRAVENOUS
  Filled 2022-06-09: qty 50

## 2022-06-09 MED ORDER — ENOXAPARIN SODIUM 40 MG/0.4ML IJ SOSY
40.0000 mg | PREFILLED_SYRINGE | INTRAMUSCULAR | Status: DC
  Administered 2022-06-09: 40 mg via SUBCUTANEOUS
  Filled 2022-06-09: qty 0.4

## 2022-06-09 NOTE — ED Triage Notes (Addendum)
EMS stated, last normal at 2200 woke up at 400 with left side weakness, some left arm drift , left neck pain, a fog head pain. Pt. Stated, right now my left side were my temples are is vibrating.

## 2022-06-09 NOTE — H&P (Signed)
History and Physical    [name]:[id number] DOB: 08-19-1975 DOA: 06/09/2022  PCP: Myrlene Broker, MD (Confirm with patient/family/NH records and if not entered, this has to be entered at [facility] point of entry) Patient coming from: Home  I have personally briefly reviewed patient's old medical records in [facility]  Chief Complaint: Headache, left-sided weakness and numbness.  HPI: Maria Wiley is a 46 y.o. female with medical history significant of recent diagnosed MS, ankylosing spondylitis, migraines headache, chronic cervical spine radiculopathy, HTN, hypothyroidism, presented with new onset of left-sided weakness and headache.  Symptoms started this morning, with sudden onset of left sided weakness and numbness along with "pounding like" headache on the right parietal area, denies any blurry vision numbness weakness of any other limbs, occasional dizziness.  She was diagnosed with MS this year, when she first had severe dizziness and blurry vision and her MRI was positive for demyelination changes.  More recently, she was diagnosed with ankylosing spondylitis, she is to start Cosentyx next week.  She reported chronic neck pain and back pain, poorly controlled probably secondary to worsening of ankylosing spondylitis.  She also admitted that recently under increasing emotional stress at home.  But reported headache, she reported that the feature is very different from previous migraine headache episodes.  Denies any nauseous vomiting urinary problems or bowel movement issue.  No fever or chills. ED Course: Brain MRI showed scattered foci of abnormal T2 and flair signal within the cerebral hemispheric white matter consistent with demyelinating disease or small vessel ischemic changes.  Cervical spine showed central and bilateral disc protrusion at C5-6 left> right moderate osteophyte complex on left C6-7 with cord flattening and left foraminal encroachment.  Review of  Systems: As per HPI otherwise 14 point review of systems negative.    Past Medical History:  Diagnosis Date   Ankylosing spondylitis (HCC)    Chest pain    COVID-19 06/2020   GERD (gastroesophageal reflux disease)    Hypertension    Migraines    Multiple sclerosis (HCC)    RA (rheumatoid arthritis) (HCC)    Thyroid disease    Urinary incontinence     Past Surgical History:  Procedure Laterality Date   ABDOMINAL HYSTERECTOMY     APPENDECTOMY     CHOLECYSTECTOMY     HERNIA REPAIR  2004   KNEE SURGERY Right 2006     reports that she has never smoked. She has never used smokeless tobacco. She reports that she does not drink alcohol and does not use drugs.  No Known Allergies  Family History  Problem Relation Age of Onset   CAD Maternal Grandfather    Diabetes Maternal Grandfather    Hearing loss Maternal Grandfather    Heart disease Maternal Grandfather    Hyperlipidemia Maternal Grandfather    Hypertension Mother    Arthritis Mother    Arthritis Maternal Grandmother    Heart disease Maternal Grandmother    Hyperlipidemia Maternal Grandmother    Migraines Neg Hx    Headache Neg Hx      Prior to Admission medications   Medication Sig Start Date End Date Taking? Authorizing Provider  amLODipine (NORVASC) 5 MG tablet Take 1 tablet (5 mg total) by mouth daily. 05/07/22  Yes Myrlene Broker, MD  Cholecalciferol (VITAMIN D3) 1000 units CAPS Take 1,000 Units by mouth daily.   Yes [provider]  diclofenac (VOLTAREN) 75 MG EC tablet Take 1 tablet (75 mg total) by mouth  2 (two) times daily for joint/back pain 02/18/22  Yes   escitalopram (LEXAPRO) 20 MG tablet Take 1 tablet (20 mg total) by mouth daily. 05/19/22  Yes Hoyt Koch, MD  folic acid (FOLVITE) 1 MG tablet Take 1 tablet (1 mg total) by mouth daily. 01/14/18  Yes Hoyt Koch, MD  gabapentin (NEURONTIN) 100 MG capsule Take 1 capsule (100 mg total) by mouth 2 (two) times daily as  needed. Patient taking differently: Take 100 mg by mouth 2 (two) times daily as needed ("for itching sensation"). 05/13/22  Yes   gabapentin (NEURONTIN) 300 MG capsule Take 1 capsule (300 mg total) by mouth at bedtime as needed. Patient taking differently: Take 300 mg by mouth at bedtime. 04/09/22  Yes   levothyroxine (SYNTHROID) 25 MCG tablet Take 1 tablet (25 mcg total) by mouth daily before breakfast. 10/31/21 10/31/22 Yes Hoyt Koch, MD  Magnesium 250 MG TABS Take 250 mg by mouth daily.   Yes [provider]  montelukast (SINGULAIR) 10 MG tablet Take 1 tablet (10 mg total) by mouth daily. 02/17/22  Yes   Semaglutide-Weight Management 2.4 MG/0.75ML SOAJ Inject 2.4 mg into the skin once a week. Patient taking differently: Inject 2.4 mg into the skin every Monday. 04/10/22  Yes Hoyt Koch, MD  solifenacin (VESICARE) 10 MG tablet Take 1 tablet (10 mg total) by mouth daily. 05/07/22  Yes Hoyt Koch, MD  sulfaSALAzine (AZULFIDINE) 500 MG tablet Take 1 tablet (500 mg total) by mouth 2 (two) times daily. 05/28/22  Yes   azelastine (ASTELIN) 0.1 % nasal spray Place 1-2 sprays into both nostrils 2 (two) times daily. Patient not taking: Reported on 06/09/2022 01/07/22     butalbital-acetaminophen-caffeine (FIORICET) 50-325-40 MG tablet Take 1 tablet by mouth every 6 (six) hours as needed for headache. Patient not taking: Reported on 06/09/2022 12/19/21   Alric Ran, MD  cetirizine (ZYRTEC ALLERGY) 10 MG tablet Take 1 tablet (10 mg total) by mouth daily. Patient not taking: Reported on 06/09/2022 08/27/21   Jaynee Eagles, PA-C  gabapentin (NEURONTIN) 300 MG capsule Take 1 capsule (300 mg total) by mouth nightly as needed. Patient not taking: Reported on 06/09/2022 05/07/22     leflunomide (ARAVA) 20 MG tablet Take 1 tablet (20 mg total) by mouth daily. Patient not taking: Reported on 06/09/2022 12/24/21     ondansetron (ZOFRAN) 4 MG tablet Take 1 tablet (4 mg total) by mouth  every 8 (eight) hours as needed for nausea or vomiting. Patient not taking: Reported on 06/09/2022 11/12/18   Couture, Cortni S, PA-C  ondansetron (ZOFRAN-ODT) 4 MG disintegrating tablet Take 1 tablet (4 mg total) by mouth every 8 (eight) hours as needed for nausea. Patient not taking: Reported on 06/09/2022 05/10/19   Melvenia Beam, MD  pseudoephedrine (SUDAFED) 60 MG tablet Take 1 tablet (60 mg total) by mouth every 8 (eight) hours as needed for congestion. Patient not taking: Reported on 06/09/2022 08/27/21   Jaynee Eagles, PA-C  Secukinumab (COSENTYX SENSOREADY PEN) 150 MG/ML SOAJ Use as directed under the skin monthly for maintenence dose Patient not taking: Reported on 06/09/2022 06/04/22   Tresa Garter, MD  valACYclovir (VALTREX) 1000 MG tablet Take 1 tablet (1,000 mg total) by mouth 2 (two) times daily. Patient not taking: Reported on 06/09/2022 02/01/20   Hoyt Koch, MD    Physical Exam: Vitals:   06/09/22 0934 06/09/22 1541 06/09/22 1556  BP: 139/88 (!) 135/98   Pulse: 85 (!) 103  Resp: 18 16   Temp: 98.6 F (37 C)  98.5 F (36.9 C)  SpO2: 98% 98%     Constitutional: NAD, calm, comfortable Vitals:   06/09/22 0934 06/09/22 1541 06/09/22 1556  BP: 139/88 (!) 135/98   Pulse: 85 (!) 103   Resp: 18 16   Temp: 98.6 F (37 C)  98.5 F (36.9 C)  SpO2: 98% 98%    Eyes: PERRL, lids and conjunctivae normal ENMT: Mucous membranes are moist. Posterior pharynx clear of any exudate or lesions.Normal dentition.  Neck: normal, supple, no masses, no thyromegaly Respiratory: clear to auscultation bilaterally, no wheezing, no crackles. Normal respiratory effort. No accessory muscle use.  Cardiovascular: Regular rate and rhythm, no murmurs / rubs / gallops. No extremity edema. 2+ pedal pulses. No carotid bruits.  Abdomen: no tenderness, no masses palpated. No hepatosplenomegaly. Bowel sounds positive.  Musculoskeletal: no clubbing / cyanosis. No joint deformity upper and lower  extremities. Good ROM, no contractures. Normal muscle tone.  Skin: no rashes, lesions, ulcers. No induration Neurologic: No facial droops, decreased light touch sensation on left arm left hand and left leg and left foot compared to right side, muscle strength 4/5 on left shoulder left arm and left 5 fingers compared to right side and 4/5 on left leg and left foot compared to right side.  Psychiatric: Normal judgment and insight. Alert and oriented x 3. Normal mood.     Labs on Admission: I have personally reviewed following labs and imaging studies  CBC: Recent Labs  Lab 06/09/22 1130 06/09/22 1148  WBC 5.2  --   NEUTROABS 2.7  --   HGB 11.6* 13.3  HCT 37.4 39.0  MCV 83.5  --   PLT 255  --    Basic Metabolic Panel: Recent Labs  Lab 06/09/22 1130 06/09/22 1148  NA 140 139  K 4.0 3.9  CL 108 106  CO2 20*  --   GLUCOSE 88 89  BUN 10 9  CREATININE 0.68 0.60  CALCIUM 9.2  --    GFR: CrCl cannot be calculated (Unknown ideal weight.). Liver Function Tests: Recent Labs  Lab 06/09/22 1130  AST 15  ALT <5  ALKPHOS 48  BILITOT 0.4  PROT 7.2  ALBUMIN 3.9   No results for input(s): "LIPASE", "AMYLASE" in the last 168 hours. No results for input(s): "AMMONIA" in the last 168 hours. Coagulation Profile: Recent Labs  Lab 06/09/22 1130  INR 1.2   Cardiac Enzymes: No results for input(s): "CKTOTAL", "CKMB", "CKMBINDEX", "TROPONINI" in the last 168 hours. BNP (last 3 results) No results for input(s): "PROBNP" in the last 8760 hours. HbA1C: No results for input(s): "HGBA1C" in the last 72 hours. CBG: No results for input(s): "GLUCAP" in the last 168 hours. Lipid Profile: No results for input(s): "CHOL", "HDL", "LDLCALC", "TRIG", "CHOLHDL", "LDLDIRECT" in the last 72 hours. Thyroid Function Tests: No results for input(s): "TSH", "T4TOTAL", "FREET4", "T3FREE", "THYROIDAB" in the last 72 hours. Anemia Panel: No results for input(s): "VITAMINB12", "FOLATE", "FERRITIN",  "TIBC", "IRON", "RETICCTPCT" in the last 72 hours. Urine analysis:    Component Value Date/Time   COLORURINE YELLOW 06/09/2022 1130   APPEARANCEUR CLEAR 06/09/2022 1130   LABSPEC 1.009 06/09/2022 1130   PHURINE 6.0 06/09/2022 1130   GLUCOSEU NEGATIVE 06/09/2022 1130   GLUCOSEU NEGATIVE 05/08/2019 1051   HGBUR NEGATIVE 06/09/2022 1130   BILIRUBINUR NEGATIVE 06/09/2022 1130   BILIRUBINUR Neg 06/20/2018 0926   KETONESUR NEGATIVE 06/09/2022 1130   PROTEINUR NEGATIVE 06/09/2022 1130  UROBILINOGEN 0.2 05/08/2019 1051   NITRITE NEGATIVE 06/09/2022 1130   LEUKOCYTESUR NEGATIVE 06/09/2022 1130    Radiological Exams on Admission: CT Angio Head Neck W WO CM  Result Date: 06/09/2022 CLINICAL DATA:  Acute neuro deficit.  Left-sided weakness. EXAM: CT ANGIOGRAPHY HEAD AND NECK TECHNIQUE: Multidetector CT imaging of the head and neck was performed using the standard protocol during bolus administration of intravenous contrast. Multiplanar CT image reconstructions and MIPs were obtained to evaluate the vascular anatomy. Carotid stenosis measurements (when applicable) are obtained utilizing NASCET criteria, using the distal internal carotid diameter as the denominator. RADIATION DOSE REDUCTION: This exam was performed according to the departmental dose-optimization program which includes automated exposure control, adjustment of the mA and/or kV according to patient size and/or use of iterative reconstruction technique. CONTRAST:  86mL OMNIPAQUE IOHEXOL 350 MG/ML SOLN COMPARISON:  CT head 11/27/2021.  MRI head 02/06/2022 FINDINGS: CT HEAD FINDINGS Brain: No evidence of acute infarction, hemorrhage, hydrocephalus, extra-axial collection or mass lesion/mass effect. Vascular: Negative for hyperdense vessel Skull: Negative Sinuses/Orbits: Paranasal sinuses clear.  Negative orbit Other: None Review of the MIP images confirms the above findings CTA NECK FINDINGS Aortic arch: Normal aortic arch. Proximal great  vessels widely patent. Left vertebral artery origin from the arch. Right carotid system: Right carotid bifurcation widely patent. Negative for atherosclerotic disease or dissection Left carotid system: Left carotid bifurcation widely patent. Negative for atherosclerotic disease or dissection. Vertebral arteries: Both vertebral arteries patent to the skull base without stenosis Skeleton: No acute abnormality Other neck: Negative for mass or adenopathy in the neck. Mild thyroid enlargement with heterogeneous enhancement. No significant nodule. Recommend thyroid ultrasound. (Ref: J Am Coll Radiol. 2015 Feb;12(2): 143-50). Upper chest: Lung apices clear bilaterally Review of the MIP images confirms the above findings CTA HEAD FINDINGS Anterior circulation: Internal carotid artery widely patent through the skull base and cavernous segment. Anterior and middle cerebral arteries widely patent and normal bilaterally. Posterior circulation: Both vertebral arteries patent to the basilar. PICA patent bilaterally. Basilar widely patent. Superior cerebellar and posterior cerebral arteries normal bilaterally Venous sinuses: Normal venous enhancement. Anatomic variants: None Review of the MIP images confirms the above findings IMPRESSION: 1. Negative CT head. 2. Negative CTA head and neck. Negative for intracranial large vessel occlusion. Negative for carotid or vertebral artery stenosis in the neck. 3. Mild thyroid enlargement with heterogeneous enhancement. Recommend thyroid ultrasound. Electronically Signed   By: Marlan Palau M.D.   On: 06/09/2022 14:36   MR Cervical Spine W or Wo Contrast  Result Date: 06/09/2022 CLINICAL DATA:  Ataxia, nontraumatic. Rule out cervical spine pathology EXAM: MRI CERVICAL SPINE WITHOUT AND WITH CONTRAST TECHNIQUE: Multiplanar and multiecho pulse sequences of the cervical spine, to include the craniocervical junction and cervicothoracic junction, were obtained without and with intravenous  contrast. CONTRAST:  59mL GADAVIST GADOBUTROL 1 MMOL/ML IV SOLN COMPARISON:  MRI cervical spine 02/16/2022 FINDINGS: Alignment: Normal Vertebrae: Normal bone marrow.  Negative for fracture or mass Cord: Normal signal and morphology Posterior Fossa, vertebral arteries, paraspinal tissues: Negative Disc levels: C2-3: Negative C3-4: Negative C4-5: Negative C5-6: Small central and bilateral disc protrusion, greater on the left than the right. This extends into the left foramen and is causing left foraminal encroachment. Left foraminal component is slightly improved from the prior study. Mild cord flattening and borderline spinal stenosis C6-7: Moderately large disc and osteophyte complex on the left with cord flattening and left foraminal encroachment. This is unchanged from the prior study. Right foramen patent C7-T1:  Negative IMPRESSION: 1. Central and bilateral disc protrusion at C5-6, greater on the left than the right. Left foraminal component is slightly improved. Borderline spinal stenosis. 2. Moderately large disc and osteophyte complex on the left at C6-7 with cord flattening and left foraminal encroachment unchanged. Electronically Signed   By: Marlan Palau M.D.   On: 06/09/2022 14:20   MR Brain W and Wo Contrast  Result Date: 06/09/2022 CLINICAL DATA:  Neuro deficit, acute, stroke suspected. EXAM: MRI HEAD WITHOUT AND WITH CONTRAST TECHNIQUE: Multiplanar, multiecho pulse sequences of the brain and surrounding structures were obtained without and with intravenous contrast. CONTRAST:  29mL GADAVIST GADOBUTROL 1 MMOL/ML IV SOLN COMPARISON:  02/16/2022.  11/27/2021.  09/30/2021. FINDINGS: Brain: Diffusion imaging does not show any acute or subacute infarction or other cause of restricted diffusion. No abnormality affects the brainstem or cerebellum. Scattered foci of abnormal T2 and FLAIR signal within the cerebral hemispheric white matter are stable since the study of February. No new or progressive  lesions. No lesions show restricted diffusion or abnormal contrast enhancement. No mass, hemorrhage, hydrocephalus or extra-axial collection. Vascular: Major vessels at the base of the brain show flow. Skull and upper cervical spine: Negative Sinuses/Orbits: Clear/normal Other: None IMPRESSION: No change since February of this year. Scattered foci of abnormal T2 and FLAIR signal within the cerebral hemispheric white matter consistent with demyelinating disease and or small vessel ischemic change. No new or progressive lesions. No restricted diffusion or abnormal contrast enhancement. Electronically Signed   By: Paulina Fusi M.D.   On: 06/09/2022 14:08    EKG: Independently reviewed.  Sinus, no acute ST changes.  Assessment/Plan Principal Problem:   Migraines Active Problems:   Multiple sclerosis (HCC)  (please populate well all problems here in Problem List. (For example, if patient is on BP meds at home and you resume or decide to hold them, it is a problem that needs to be her. Same for CAD, COPD, HLD and so on)  Left sided paresis and paresthesia -Stroke rule out -Etiology not clear, on-call neurology reviewed MRI and patient condition, not considering to treat MS flareup given no significant new brain or lumbar spine demyelination lesions on MRI.  Working diagnosis included complex migraines although patient describes the feature of today's headache is different from her previous episodes, ED gave 1 dose of IV magnesium, will order as needed Fioricet for headache. -Stroke ruled out, however given the working differential also including TIA, will start patient on aspirin 81 mg daily and check lipid panel and A1c -Other DDx, cervical stenosis and/or cervical spine radiculopathy less likely to explain one-sided both upper and lower weakness and numbness.  Waiting for neurology input.  Hold off steroid as per neurology for now. -Frequent neurochecks -PT evaluation tomorrow -Conversion  disorder/somatization should only be considered if no organic etiology can be identified and symptoms persisted.  Complex migraine -We will treat as if there is a flareup of migraine for now, as there is a headache on going although features are different as compared to past episodes. Trial with Fioricet.  Cervical spine stenosis and radiculopathy, acute on chronic -As discussed above, less likely to explain today's neurological symptoms, neurosurgery paged in ED. Hold off steroids as per neurology for now.  History of multiple sclerosis -Right now, not on maintenance treatment, outpatient follow-up with neurology.  Ankylosing spondylitis -She will start Cosentyx next week  HTN -Controlled, on amlodipine  Hypothyroidism -Continue Synthroid  Anxiety/depression -Continue Lexapro -As needed gabapentin   DVT  prophylaxis: Lovenox Code Status: Full code Family Communication: Husband over the phone Disposition Plan: Expect less than 2 midnight hospital stay unless otherwise recommended by neurology Consults called: Neurology, neurosurgery also paged by ED physician Admission status: Telemetry observation   Emeline General MD [facility] Pager [phone]  06/09/2022, 6:54 PM

## 2022-06-09 NOTE — ED Notes (Signed)
Patient vitals to be updated after return from CT

## 2022-06-09 NOTE — ED Provider Notes (Signed)
[facility] EMERGENCY DEPARTMENT Provider Note   CSN: [id number] Arrival date & time: 06/09/22  4403     History {Add pertinent medical, surgical, social history, OB history to HPI:1} Chief Complaint  Patient presents with   stroke like symptoms   Weakness   Facial Droop   Headache    Maria Wiley is a 46 y.o. female.  HPI      Left sided weakness, difficulty moving left arm and leg, spasm of leg and arm on the left Had some tingling of left side of face has had that before, and vibration of head, has had that before, thought it was related to the MS.  Doesn't feel like a migraine No facial weakness, change in vision Difficulty walking due to the weakness Burgess Estelle was ok  No falls or trauma Did have pain in neck like needed to stretch it out Still feeling symptoms now, head feels strange  No CP, diaphoresis Did have nausea   Grandfather died 51 or 5, uncle had heart transplant   Home Medications Prior to Admission medications   Medication Sig Start Date End Date Taking? Authorizing Provider  amLODipine (NORVASC) 5 MG tablet Take 1 tablet (5 mg total) by mouth daily. 05/07/22   Myrlene Broker, MD  azelastine (ASTELIN) 0.1 % nasal spray Place 1-2 sprays into both nostrils 2 (two) times daily. 01/07/22     butalbital-acetaminophen-caffeine (FIORICET) 50-325-40 MG tablet Take 1 tablet by mouth every 6 (six) hours as needed for headache. 12/19/21   Windell Norfolk, MD  cetirizine (ZYRTEC ALLERGY) 10 MG tablet Take 1 tablet (10 mg total) by mouth daily. 08/27/21   Wallis Bamberg, PA-C  cholecalciferol (VITAMIN D) 1000 units tablet Take 1,000 Units by mouth daily.    [provider]  diclofenac (VOLTAREN) 75 MG EC tablet Take 1 tablet (75 mg total) by mouth 2 (two) times daily for joint/back pain 02/18/22     escitalopram (LEXAPRO) 20 MG tablet Take 1 tablet (20 mg total) by mouth daily. 05/19/22   Myrlene Broker, MD  folic acid  (FOLVITE) 1 MG tablet Take 1 tablet (1 mg total) by mouth daily. 01/14/18   Myrlene Broker, MD  gabapentin (NEURONTIN) 100 MG capsule Take 1 capsule (100 mg total) by mouth 2 (two) times daily as needed. 05/13/22     gabapentin (NEURONTIN) 300 MG capsule Take 1 capsule (300 mg total) by mouth at bedtime as needed. 04/09/22     gabapentin (NEURONTIN) 300 MG capsule Take 1 capsule (300 mg total) by mouth nightly as needed. 05/07/22     leflunomide (ARAVA) 20 MG tablet Take 1 tablet (20 mg total) by mouth daily. 12/24/21     levothyroxine (SYNTHROID) 25 MCG tablet Take 1 tablet (25 mcg total) by mouth daily before breakfast. 10/31/21 10/31/22  Myrlene Broker, MD  Magnesium 250 MG TABS Take 250 mg by mouth daily.    [provider]  montelukast (SINGULAIR) 10 MG tablet Take 1 tablet (10 mg total) by mouth daily. 02/17/22     ondansetron (ZOFRAN) 4 MG tablet Take 1 tablet (4 mg total) by mouth every 8 (eight) hours as needed for nausea or vomiting. 11/12/18   Couture, Cortni S, PA-C  ondansetron (ZOFRAN-ODT) 4 MG disintegrating tablet Take 1 tablet (4 mg total) by mouth every 8 (eight) hours as needed for nausea. 05/10/19   Anson Fret, MD  pseudoephedrine (SUDAFED) 60 MG tablet Take 1 tablet (60 mg total) by mouth  every 8 (eight) hours as needed for congestion. 08/27/21   Jaynee Eagles, PA-C  Secukinumab (COSENTYX SENSOREADY PEN) 150 MG/ML SOAJ Use as directed under the skin monthly for maintenence dose 06/04/22   Tresa Garter, MD  Semaglutide-Weight Management 2.4 MG/0.75ML SOAJ Inject 2.4 mg into the skin once a week. 04/10/22   Hoyt Koch, MD  solifenacin (VESICARE) 10 MG tablet Take 1 tablet (10 mg total) by mouth daily. 05/07/22   Hoyt Koch, MD  sulfaSALAzine (AZULFIDINE) 500 MG tablet Take 1 tablet (500 mg total) by mouth 2 (two) times daily. 05/28/22     valACYclovir (VALTREX) 1000 MG tablet Take 1 tablet (1,000 mg total) by mouth 2 (two) times daily.  02/01/20   Hoyt Koch, MD      Allergies    Patient has no known allergies.    Review of Systems   Review of Systems  Physical Exam Updated Vital Signs BP (!) 135/98 (BP Location: Right Arm)   Pulse (!) 103   Temp 98.5 F (36.9 C)   Resp 16   SpO2 98%  Physical Exam  ED Results / Procedures / Treatments   Labs (all labs ordered are listed, but only abnormal results are displayed) Labs Reviewed  CBC - Abnormal; Notable for the following components:      Result Value   Hemoglobin 11.6 (*)    MCH 25.9 (*)    All other components within normal limits  COMPREHENSIVE METABOLIC PANEL - Abnormal; Notable for the following components:   CO2 20 (*)    All other components within normal limits  I-STAT CHEM 8, ED - Abnormal; Notable for the following components:   Calcium, Ion 1.11 (*)    All other components within normal limits  URINALYSIS, ROUTINE W REFLEX MICROSCOPIC  PROTIME-INR  APTT  DIFFERENTIAL  RAPID URINE DRUG SCREEN, HOSP PERFORMED  ETHANOL  I-STAT BETA HCG BLOOD, ED ([facility], [facility], [facility] ONLY)    EKG EKG Interpretation  Date/Time:  Tuesday June 09 2022 09:36:02 EST Ventricular Rate:  93 PR Interval:  138 QRS Duration: 86 QT Interval:  358 QTC Calculation: 445 R Axis:   36 Text Interpretation: Normal sinus rhythm ST & T wave abnormality, consider lateral ischemia Abnormal ECG When compared with ECG of 04-Sep-2017 06:02,  Nonspecific changes Confirmed by Gareth Morgan [phone]) on 06/09/2022 4:55:57 PM  Radiology CT Angio Head Neck W WO CM  Result Date: 06/09/2022 CLINICAL DATA:  Acute neuro deficit.  Left-sided weakness. EXAM: CT ANGIOGRAPHY HEAD AND NECK TECHNIQUE: Multidetector CT imaging of the head and neck was performed using the standard protocol during bolus administration of intravenous contrast. Multiplanar CT image reconstructions and MIPs were obtained to evaluate the vascular anatomy. Carotid stenosis measurements (when applicable) are obtained  utilizing NASCET criteria, using the distal internal carotid diameter as the denominator. RADIATION DOSE REDUCTION: This exam was performed according to the departmental dose-optimization program which includes automated exposure control, adjustment of the mA and/or kV according to patient size and/or use of iterative reconstruction technique. CONTRAST:  72mL OMNIPAQUE IOHEXOL 350 MG/ML SOLN COMPARISON:  CT head 11/27/2021.  MRI head 02/06/2022 FINDINGS: CT HEAD FINDINGS Brain: No evidence of acute infarction, hemorrhage, hydrocephalus, extra-axial collection or mass lesion/mass effect. Vascular: Negative for hyperdense vessel Skull: Negative Sinuses/Orbits: Paranasal sinuses clear.  Negative orbit Other: None Review of the MIP images confirms the above findings CTA NECK FINDINGS Aortic arch: Normal aortic arch. Proximal great vessels widely patent. Left vertebral artery origin from  the arch. Right carotid system: Right carotid bifurcation widely patent. Negative for atherosclerotic disease or dissection Left carotid system: Left carotid bifurcation widely patent. Negative for atherosclerotic disease or dissection. Vertebral arteries: Both vertebral arteries patent to the skull base without stenosis Skeleton: No acute abnormality Other neck: Negative for mass or adenopathy in the neck. Mild thyroid enlargement with heterogeneous enhancement. No significant nodule. Recommend thyroid ultrasound. (Ref: J Am Coll Radiol. 2015 Feb;12(2): 143-50). Upper chest: Lung apices clear bilaterally Review of the MIP images confirms the above findings CTA HEAD FINDINGS Anterior circulation: Internal carotid artery widely patent through the skull base and cavernous segment. Anterior and middle cerebral arteries widely patent and normal bilaterally. Posterior circulation: Both vertebral arteries patent to the basilar. PICA patent bilaterally. Basilar widely patent. Superior cerebellar and posterior cerebral arteries normal  bilaterally Venous sinuses: Normal venous enhancement. Anatomic variants: None Review of the MIP images confirms the above findings IMPRESSION: 1. Negative CT head. 2. Negative CTA head and neck. Negative for intracranial large vessel occlusion. Negative for carotid or vertebral artery stenosis in the neck. 3. Mild thyroid enlargement with heterogeneous enhancement. Recommend thyroid ultrasound. Electronically Signed   By: Marlan Palau M.D.   On: 06/09/2022 14:36   MR Cervical Spine W or Wo Contrast  Result Date: 06/09/2022 CLINICAL DATA:  Ataxia, nontraumatic. Rule out cervical spine pathology EXAM: MRI CERVICAL SPINE WITHOUT AND WITH CONTRAST TECHNIQUE: Multiplanar and multiecho pulse sequences of the cervical spine, to include the craniocervical junction and cervicothoracic junction, were obtained without and with intravenous contrast. CONTRAST:  59mL GADAVIST GADOBUTROL 1 MMOL/ML IV SOLN COMPARISON:  MRI cervical spine 02/16/2022 FINDINGS: Alignment: Normal Vertebrae: Normal bone marrow.  Negative for fracture or mass Cord: Normal signal and morphology Posterior Fossa, vertebral arteries, paraspinal tissues: Negative Disc levels: C2-3: Negative C3-4: Negative C4-5: Negative C5-6: Small central and bilateral disc protrusion, greater on the left than the right. This extends into the left foramen and is causing left foraminal encroachment. Left foraminal component is slightly improved from the prior study. Mild cord flattening and borderline spinal stenosis C6-7: Moderately large disc and osteophyte complex on the left with cord flattening and left foraminal encroachment. This is unchanged from the prior study. Right foramen patent C7-T1: Negative IMPRESSION: 1. Central and bilateral disc protrusion at C5-6, greater on the left than the right. Left foraminal component is slightly improved. Borderline spinal stenosis. 2. Moderately large disc and osteophyte complex on the left at C6-7 with cord flattening and  left foraminal encroachment unchanged. Electronically Signed   By: Marlan Palau M.D.   On: 06/09/2022 14:20   MR Brain W and Wo Contrast  Result Date: 06/09/2022 CLINICAL DATA:  Neuro deficit, acute, stroke suspected. EXAM: MRI HEAD WITHOUT AND WITH CONTRAST TECHNIQUE: Multiplanar, multiecho pulse sequences of the brain and surrounding structures were obtained without and with intravenous contrast. CONTRAST:  20mL GADAVIST GADOBUTROL 1 MMOL/ML IV SOLN COMPARISON:  02/16/2022.  11/27/2021.  09/30/2021. FINDINGS: Brain: Diffusion imaging does not show any acute or subacute infarction or other cause of restricted diffusion. No abnormality affects the brainstem or cerebellum. Scattered foci of abnormal T2 and FLAIR signal within the cerebral hemispheric white matter are stable since the study of February. No new or progressive lesions. No lesions show restricted diffusion or abnormal contrast enhancement. No mass, hemorrhage, hydrocephalus or extra-axial collection. Vascular: Major vessels at the base of the brain show flow. Skull and upper cervical spine: Negative Sinuses/Orbits: Clear/normal Other: None IMPRESSION: No change since February  of this year. Scattered foci of abnormal T2 and FLAIR signal within the cerebral hemispheric white matter consistent with demyelinating disease and or small vessel ischemic change. No new or progressive lesions. No restricted diffusion or abnormal contrast enhancement. Electronically Signed   By: Paulina Fusi M.D.   On: 06/09/2022 14:08    Procedures Procedures  {Document cardiac monitor, telemetry assessment procedure when appropriate:1}  Medications Ordered in ED Medications  gadobutrol (GADAVIST) 1 MMOL/ML injection 10 mL (10 mLs Intravenous Contrast Given 06/09/22 1349)  iohexol (OMNIPAQUE) 350 MG/ML injection 75 mL (75 mLs Intravenous Contrast Given 06/09/22 1402)    ED Course/ Medical Decision Making/ A&P Clinical Course as of 06/09/22 1707  Tue Jun 09, 2022   1230 Spoke with neurology Dr. Derry Lory, he suggested MR brain and c spine with and without contrast [AS]    Clinical Course User Index [AS] Lula Olszewski Edsel Petrin, PA-C                           Medical Decision Making  ***  {Document critical care time when appropriate:1} {Document review of labs and clinical decision tools ie heart score, Chads2Vasc2 etc:1}  {Document your independent review of radiology images, and any outside records:1} {Document your discussion with family members, caretakers, and with consultants:1} {Document social determinants of health affecting pt's care:1} {Document your decision making why or why not admission, treatments were needed:1} Final Clinical Impression(s) / ED Diagnoses Final diagnoses:  None    Rx / DC Orders ED Discharge Orders     None

## 2022-06-09 NOTE — ED Provider Triage Note (Signed)
Emergency Medicine Provider Triage Evaluation Note  Maria Wiley , a 46 y.o. female  was evaluated in triage.  Pt complains of left arm and leg weakness, burning, tingling sensation.  Also complaining of head vibration.  Patient has a history of MS.  Went to bed at 10 PM last night with a vibrating headache.  Woke up at 4 AM with the symptoms.  Also complaining of nausea.  No dizziness, lightheadedness, blurred vision, slurred speech  Review of Systems  Positive: As above Negative: As above  Physical Exam  BP 139/88 (BP Location: Right Arm)   Pulse 85   Temp 98.6 F (37 C)   Resp 18   SpO2 98%  Gen:   Awake, no distress   Resp:  Normal effort  MSK:   Difficulty moving left sided extremities Other:  Positive left-sided pronator drift, left-sided arm and leg weakness, abnormal left-sided heel-to-shin.  No facial asymmetry or slurred speech, pupils equal and reactive  Medical Decision Making  Medically screening exam initiated at 9:54 AM.  Appropriate orders placed.  [name] was informed that the remainder of the evaluation will be completed by another provider, this initial triage assessment does not replace that evaluation, and the importance of remaining in the ED until their evaluation is complete.  Consulting neurology. Ordered stroke workup   Roylene Reason, Hershal Coria 06/09/22 1157

## 2022-06-10 ENCOUNTER — Other Ambulatory Visit (HOSPITAL_COMMUNITY): Payer: Self-pay

## 2022-06-10 DIAGNOSIS — G43409 Hemiplegic migraine, not intractable, without status migrainosus: Secondary | ICD-10-CM | POA: Diagnosis not present

## 2022-06-10 DIAGNOSIS — G35 Multiple sclerosis: Secondary | ICD-10-CM

## 2022-06-10 LAB — LIPID PANEL
Cholesterol: 208 mg/dL — ABNORMAL HIGH (ref 0–200)
HDL: 39 mg/dL — ABNORMAL LOW (ref >40)
LDL Cholesterol: 154 mg/dL — ABNORMAL HIGH (ref 0–99)
Total CHOL/HDL Ratio: 5.3 RATIO
Triglycerides: 75 mg/dL (ref <150)
VLDL: 15 mg/dL (ref 0–40)

## 2022-06-10 MED ORDER — ASPIRIN 81 MG PO TBEC
81.0000 mg | DELAYED_RELEASE_TABLET | Freq: Every day | ORAL | 12 refills | Status: AC
  Filled 2022-06-10 – 2022-06-11 (×2): qty 30, 30d supply, fill #0
  Filled 2022-07-01 – 2022-07-22 (×2): qty 30, 30d supply, fill #1
  Filled 2022-08-22: qty 30, 30d supply, fill #2
  Filled 2022-09-21: qty 30, 30d supply, fill #3
  Filled 2022-10-21: qty 30, 30d supply, fill #4
  Filled 2022-11-24: qty 30, 30d supply, fill #5
  Filled 2022-12-17: qty 30, 30d supply, fill #6
  Filled 2023-01-16: qty 30, 30d supply, fill #7
  Filled 2023-02-18: qty 30, 30d supply, fill #8
  Filled 2023-03-19: qty 30, 30d supply, fill #9
  Filled 2023-04-26: qty 30, 30d supply, fill #10
  Filled 2023-05-24: qty 30, 30d supply, fill #11

## 2022-06-10 NOTE — Discharge Instructions (Signed)
Follow with Myrlene Broker, MD in 5-7 days  Please get a complete blood count and chemistry panel checked by your Primary MD at your next visit, and again as instructed by your Primary MD. Please get your medications reviewed and adjusted by your Primary MD.  Please request your Primary MD to go over all Hospital Tests and Procedure/Radiological results at the follow up, please get all Hospital records sent to your Prim MD by signing hospital release before you go home.  In some cases, there will be blood work, cultures and biopsy results pending at the time of your discharge. Please request that your primary care M.D. goes through all the records of your hospital data and follows up on these results.  If you had Pneumonia of Lung problems at the Hospital: Please get a 2 view Chest X ray done in 6-8 weeks after hospital discharge or sooner if instructed by your Primary MD.  If you have Congestive Heart Failure: Please call your Cardiologist or Primary MD anytime you have any of the following symptoms:  1) 3 pound weight gain in 24 hours or 5 pounds in 1 week  2) shortness of breath, with or without a dry hacking cough  3) swelling in the hands, feet or stomach  4) if you have to sleep on extra pillows at night in order to breathe  Follow cardiac low salt diet and 1.5 lit/day fluid restriction.  If you have diabetes Accuchecks 4 times/day, Once in AM empty stomach and then before each meal. Log in all results and show them to your primary doctor at your next visit. If any glucose reading is under 80 or above 300 call your primary MD immediately.  If you have Seizure/Convulsions/Epilepsy: Please do not drive, operate heavy machinery, participate in activities at heights or participate in high speed sports until you have seen by Primary MD or a Neurologist and advised to do so again. Per [facility] statutes, patients with seizures are not allowed to drive until they have been  seizure-free for six months.  Use caution when using heavy equipment or power tools. Avoid working on ladders or at heights. Take showers instead of baths. Ensure the water temperature is not too high on the home water heater. Do not go swimming alone. Do not lock yourself in a room alone (i.e. bathroom). When caring for infants or small children, sit down when holding, feeding, or changing them to minimize risk of injury to the child in the event you have a seizure. Maintain good sleep hygiene. Avoid alcohol.   If you had Gastrointestinal Bleeding: Please ask your Primary MD to check a complete blood count within one week of discharge or at your next visit. Your endoscopic/colonoscopic biopsies that are pending at the time of discharge, will also need to followed by your Primary MD.  Get Medicines reviewed and adjusted. Please take all your medications with you for your next visit with your Primary MD  Please request your Primary MD to go over all hospital tests and procedure/radiological results at the follow up, please ask your Primary MD to get all Hospital records sent to his/her office.  If you experience worsening of your admission symptoms, develop shortness of breath, life threatening emergency, suicidal or homicidal thoughts you must seek medical attention immediately by calling 911 or calling your MD immediately  if symptoms less severe.  You must read complete instructions/literature along with all the possible adverse reactions/side effects for all the Medicines you  take and that have been prescribed to you. Take any new Medicines after you have completely understood and accpet all the possible adverse reactions/side effects.   Do not drive or operate heavy machinery when taking Pain medications.   Do not take more than prescribed Pain, Sleep and Anxiety Medications  Special Instructions: If you have smoked or chewed Tobacco  in the last 2 yrs please stop smoking, stop any regular  Alcohol  and or any Recreational drug use.  Wear Seat belts while driving.  Please note You were cared for by a hospitalist during your hospital stay. If you have any questions about your discharge medications or the care you received while you were in the hospital after you are discharged, you can call the unit and asked to speak with the hospitalist on call if the hospitalist that took care of you is not available. Once you are discharged, your primary care physician will handle any further medical issues. Please note that NO REFILLS for any discharge medications will be authorized once you are discharged, as it is imperative that you return to your primary care physician (or establish a relationship with a primary care physician if you do not have one) for your aftercare needs so that they can reassess your need for medications and monitor your lab values.  You can reach the hospitalist office at phone [phone] or fax [phone]   If you do not have a primary care physician, you can call [phone] for a physician referral.  Activity: As tolerated with Full fall precautions use walker/cane & assistance as needed    Diet: heart healthy  Disposition Home

## 2022-06-10 NOTE — Discharge Summary (Addendum)
Physician Discharge Summary  Maria Wiley JDB:[id number] DOB: 12-10-1975 DOA: 06/09/2022  PCP: Myrlene Broker, MD  Admit date: 06/09/2022 Discharge date: 06/10/2022  Admitted From: home Disposition:  home  Recommendations for Outpatient Follow-up:  Follow up with PCP in 1-2 weeks Follow-up with [facility] neurology as scheduled  Home Health: none Equipment/Devices: none  Discharge Condition: stable CODE STATUS: Full code  HPI: Per admitting MD, Maria Wiley is a 46 y.o. female with medical history significant of recent diagnosed MS, ankylosing spondylitis, migraines headache, chronic cervical spine radiculopathy, HTN, hypothyroidism, presented with new onset of left-sided weakness and headache. Symptoms started this morning, with sudden onset of left sided weakness and numbness along with "pounding like" headache on the right parietal area, denies any blurry vision numbness weakness of any other limbs, occasional dizziness.  She was diagnosed with MS this year, when she first had severe dizziness and blurry vision and her MRI was positive for demyelination changes.  More recently, she was diagnosed with ankylosing spondylitis, she is to start Cosentyx next week.  She reported chronic neck pain and back pain, poorly controlled probably secondary to worsening of ankylosing spondylitis.  She also admitted that recently under increasing emotional stress at home.  But reported headache, she reported that the feature is very different from previous migraine headache episodes.  Denies any nauseous vomiting urinary problems or bowel movement issue.  No fever or chills. ED Course: Brain MRI showed scattered foci of abnormal T2 and flair signal within the cerebral hemispheric white matter consistent with demyelinating disease or small vessel ischemic changes.  Cervical spine showed central and bilateral disc protrusion at C5-6 left> right moderate osteophyte complex on left C6-7 with cord  flattening and left foraminal encroachment.  Hospital Course / Discharge diagnoses: Principal Problem:   Migraines Active Problems:   Multiple sclerosis (HCC)  Principal problem Left-sided weakness, paresthesia, headache-patient was admitted to the hospital with left-sided weakness.  Neurology was consulted over the phone, recommending MRI of the brain and C-spine.  That was done and stroke was ruled out.  Given her headache she was given Fioricet with resolution, and significant improvement in her symptoms.  She is back to baseline, able to ambulate in the hallway without difficulties, asking to go home.  She will be discharged in improved condition, has follow-up with neurology as an outpatient  Active problems Complex migraine-with weakness, symptoms resolved with resolution of her migraine headache MS-outpatient follow-up with neurology.  MRI without any active demyelination lesions Cervical spine stenosis radiculopathy, chronic-outpatient follow-up Ankylosing spondylitis-outpatient follow-up Essential hypertension-continue home medications Hypothyroidism-continue home medications Anxiety/depression-continue home medications  Sepsis ruled out   Discharge Instructions   Allergies as of 06/10/2022   No Known Allergies      Medication List     STOP taking these medications    pseudoephedrine 60 MG tablet Commonly known as: SUDAFED       TAKE these medications    amLODipine 5 MG tablet Commonly known as: NORVASC Take 1 tablet (5 mg total) by mouth daily.   aspirin EC 81 MG tablet Take 1 tablet (81 mg total) by mouth daily. Swallow whole.   azelastine 0.1 % nasal spray Commonly known as: ASTELIN Place 1-2 sprays into both nostrils 2 (two) times daily.   butalbital-acetaminophen-caffeine 50-325-40 MG tablet Commonly known as: FIORICET Take 1 tablet by mouth every 6 (six) hours as needed for headache.   cetirizine 10 MG tablet Commonly known as: ZyrTEC  Allergy Take 1  tablet (10 mg total) by mouth daily.   Cosentyx Sensoready Pen 150 MG/ML Soaj Generic drug: Secukinumab Use as directed under the skin monthly for maintenence dose   diclofenac 75 MG EC tablet Commonly known as: VOLTAREN Take 1 tablet (75 mg total) by mouth 2 (two) times daily for joint/back pain   escitalopram 20 MG tablet Commonly known as: LEXAPRO Take 1 tablet (20 mg total) by mouth daily.   folic acid 1 MG tablet Commonly known as: FOLVITE Take 1 tablet (1 mg total) by mouth daily.   gabapentin 300 MG capsule Commonly known as: NEURONTIN Take 1 capsule (300 mg total) by mouth at bedtime as needed. What changed:  when to take this Another medication with the same name was removed. Continue taking this medication, and follow the directions you see here.   leflunomide 20 MG tablet Commonly known as: ARAVA Take 1 tablet (20 mg total) by mouth daily.   levothyroxine 25 MCG tablet Commonly known as: SYNTHROID Take 1 tablet (25 mcg total) by mouth daily before breakfast.   Magnesium 250 MG Tabs Take 250 mg by mouth daily.   montelukast 10 MG tablet Commonly known as: SINGULAIR Take 1 tablet (10 mg total) by mouth daily.   ondansetron 4 MG disintegrating tablet Commonly known as: ZOFRAN-ODT Take 1 tablet (4 mg total) by mouth every 8 (eight) hours as needed for nausea.   ondansetron 4 MG tablet Commonly known as: ZOFRAN Take 1 tablet (4 mg total) by mouth every 8 (eight) hours as needed for nausea or vomiting.   solifenacin 10 MG tablet Commonly known as: VESICARE Take 1 tablet (10 mg total) by mouth daily.   sulfaSALAzine 500 MG tablet Commonly known as: AZULFIDINE Take 1 tablet (500 mg total) by mouth 2 (two) times daily.   valACYclovir 1000 MG tablet Commonly known as: VALTREX Take 1 tablet (1,000 mg total) by mouth 2 (two) times daily.   Vitamin D3 1000 units Caps Take 1,000 Units by mouth daily.   Wegovy 2.4 MG/0.75ML Soaj Generic  drug: Semaglutide-Weight Management Inject 2.4 mg into the skin once a week. What changed: when to take this         Consultations: none  Procedures/Studies:  CT Angio Head Neck W WO CM  Result Date: 06/09/2022 CLINICAL DATA:  Acute neuro deficit.  Left-sided weakness. EXAM: CT ANGIOGRAPHY HEAD AND NECK TECHNIQUE: Multidetector CT imaging of the head and neck was performed using the standard protocol during bolus administration of intravenous contrast. Multiplanar CT image reconstructions and MIPs were obtained to evaluate the vascular anatomy. Carotid stenosis measurements (when applicable) are obtained utilizing NASCET criteria, using the distal internal carotid diameter as the denominator. RADIATION DOSE REDUCTION: This exam was performed according to the departmental dose-optimization program which includes automated exposure control, adjustment of the mA and/or kV according to patient size and/or use of iterative reconstruction technique. CONTRAST:  1mL OMNIPAQUE IOHEXOL 350 MG/ML SOLN COMPARISON:  CT head 11/27/2021.  MRI head 02/06/2022 FINDINGS: CT HEAD FINDINGS Brain: No evidence of acute infarction, hemorrhage, hydrocephalus, extra-axial collection or mass lesion/mass effect. Vascular: Negative for hyperdense vessel Skull: Negative Sinuses/Orbits: Paranasal sinuses clear.  Negative orbit Other: None Review of the MIP images confirms the above findings CTA NECK FINDINGS Aortic arch: Normal aortic arch. Proximal great vessels widely patent. Left vertebral artery origin from the arch. Right carotid system: Right carotid bifurcation widely patent. Negative for atherosclerotic disease or dissection Left carotid system: Left carotid bifurcation widely patent. Negative for atherosclerotic  disease or dissection. Vertebral arteries: Both vertebral arteries patent to the skull base without stenosis Skeleton: No acute abnormality Other neck: Negative for mass or adenopathy in the neck. Mild thyroid  enlargement with heterogeneous enhancement. No significant nodule. Recommend thyroid ultrasound. (Ref: J Am Coll Radiol. 2015 Feb;12(2): 143-50). Upper chest: Lung apices clear bilaterally Review of the MIP images confirms the above findings CTA HEAD FINDINGS Anterior circulation: Internal carotid artery widely patent through the skull base and cavernous segment. Anterior and middle cerebral arteries widely patent and normal bilaterally. Posterior circulation: Both vertebral arteries patent to the basilar. PICA patent bilaterally. Basilar widely patent. Superior cerebellar and posterior cerebral arteries normal bilaterally Venous sinuses: Normal venous enhancement. Anatomic variants: None Review of the MIP images confirms the above findings IMPRESSION: 1. Negative CT head. 2. Negative CTA head and neck. Negative for intracranial large vessel occlusion. Negative for carotid or vertebral artery stenosis in the neck. 3. Mild thyroid enlargement with heterogeneous enhancement. Recommend thyroid ultrasound. Electronically Signed   By: Marlan Palau M.D.   On: 06/09/2022 14:36   MR Cervical Spine W or Wo Contrast  Result Date: 06/09/2022 CLINICAL DATA:  Ataxia, nontraumatic. Rule out cervical spine pathology EXAM: MRI CERVICAL SPINE WITHOUT AND WITH CONTRAST TECHNIQUE: Multiplanar and multiecho pulse sequences of the cervical spine, to include the craniocervical junction and cervicothoracic junction, were obtained without and with intravenous contrast. CONTRAST:  18mL GADAVIST GADOBUTROL 1 MMOL/ML IV SOLN COMPARISON:  MRI cervical spine 02/16/2022 FINDINGS: Alignment: Normal Vertebrae: Normal bone marrow.  Negative for fracture or mass Cord: Normal signal and morphology Posterior Fossa, vertebral arteries, paraspinal tissues: Negative Disc levels: C2-3: Negative C3-4: Negative C4-5: Negative C5-6: Small central and bilateral disc protrusion, greater on the left than the right. This extends into the left foramen and  is causing left foraminal encroachment. Left foraminal component is slightly improved from the prior study. Mild cord flattening and borderline spinal stenosis C6-7: Moderately large disc and osteophyte complex on the left with cord flattening and left foraminal encroachment. This is unchanged from the prior study. Right foramen patent C7-T1: Negative IMPRESSION: 1. Central and bilateral disc protrusion at C5-6, greater on the left than the right. Left foraminal component is slightly improved. Borderline spinal stenosis. 2. Moderately large disc and osteophyte complex on the left at C6-7 with cord flattening and left foraminal encroachment unchanged. Electronically Signed   By: Marlan Palau M.D.   On: 06/09/2022 14:20   MR Brain W and Wo Contrast  Result Date: 06/09/2022 CLINICAL DATA:  Neuro deficit, acute, stroke suspected. EXAM: MRI HEAD WITHOUT AND WITH CONTRAST TECHNIQUE: Multiplanar, multiecho pulse sequences of the brain and surrounding structures were obtained without and with intravenous contrast. CONTRAST:  45mL GADAVIST GADOBUTROL 1 MMOL/ML IV SOLN COMPARISON:  02/16/2022.  11/27/2021.  09/30/2021. FINDINGS: Brain: Diffusion imaging does not show any acute or subacute infarction or other cause of restricted diffusion. No abnormality affects the brainstem or cerebellum. Scattered foci of abnormal T2 and FLAIR signal within the cerebral hemispheric white matter are stable since the study of February. No new or progressive lesions. No lesions show restricted diffusion or abnormal contrast enhancement. No mass, hemorrhage, hydrocephalus or extra-axial collection. Vascular: Major vessels at the base of the brain show flow. Skull and upper cervical spine: Negative Sinuses/Orbits: Clear/normal Other: None IMPRESSION: No change since February of this year. Scattered foci of abnormal T2 and FLAIR signal within the cerebral hemispheric white matter consistent with demyelinating disease and or small vessel  ischemic  change. No new or progressive lesions. No restricted diffusion or abnormal contrast enhancement. Electronically Signed   By: Paulina Fusi M.D.   On: 06/09/2022 14:08     Subjective: - no chest pain, shortness of breath, no abdominal pain, nausea or vomiting.   Discharge Exam: BP (!) 120/90 (BP Location: Right Arm)   Pulse (!) 103   Temp 98.1 F (36.7 C) (Oral)   Resp 16   SpO2 98%   General: Pt is alert, awake, not in acute distress Extremities: no edema, no cyanosis Neuro: non focal    The results of significant diagnostics from this hospitalization (including imaging, microbiology, ancillary and laboratory) are listed below for reference.     Microbiology: No results found for this or any previous visit (from the past 240 hour(s)).   Labs: Basic Metabolic Panel: Recent Labs  Lab 06/09/22 1130 06/09/22 1148  NA 140 139  K 4.0 3.9  CL 108 106  CO2 20*  --   GLUCOSE 88 89  BUN 10 9  CREATININE 0.68 0.60  CALCIUM 9.2  --    Liver Function Tests: Recent Labs  Lab 06/09/22 1130  AST 15  ALT <5  ALKPHOS 48  BILITOT 0.4  PROT 7.2  ALBUMIN 3.9   CBC: Recent Labs  Lab 06/09/22 1130 06/09/22 1148  WBC 5.2  --   NEUTROABS 2.7  --   HGB 11.6* 13.3  HCT 37.4 39.0  MCV 83.5  --   PLT 255  --    CBG: No results for input(s): "GLUCAP" in the last 168 hours. Hgb A1c Recent Labs    06/09/22 2208  HGBA1C 5.1   Lipid Profile Recent Labs    06/10/22 0416  CHOL 208*  HDL 39*  LDLCALC 154*  TRIG 75  CHOLHDL 5.3   Thyroid function studies No results for input(s): "TSH", "T4TOTAL", "T3FREE", "THYROIDAB" in the last 72 hours.  Invalid input(s): "FREET3" Urinalysis    Component Value Date/Time   COLORURINE YELLOW 06/09/2022 1130   APPEARANCEUR CLEAR 06/09/2022 1130   LABSPEC 1.009 06/09/2022 1130   PHURINE 6.0 06/09/2022 1130   GLUCOSEU NEGATIVE 06/09/2022 1130   GLUCOSEU NEGATIVE 05/08/2019 1051   HGBUR NEGATIVE 06/09/2022 1130    BILIRUBINUR NEGATIVE 06/09/2022 1130   BILIRUBINUR Neg 06/20/2018 0926   KETONESUR NEGATIVE 06/09/2022 1130   PROTEINUR NEGATIVE 06/09/2022 1130   UROBILINOGEN 0.2 05/08/2019 1051   NITRITE NEGATIVE 06/09/2022 1130   LEUKOCYTESUR NEGATIVE 06/09/2022 1130    FURTHER DISCHARGE INSTRUCTIONS:   Get Medicines reviewed and adjusted: Please take all your medications with you for your next visit with your Primary MD   Laboratory/radiological data: Please request your Primary MD to go over all hospital tests and procedure/radiological results at the follow up, please ask your Primary MD to get all Hospital records sent to his/her office.   In some cases, they will be blood work, cultures and biopsy results pending at the time of your discharge. Please request that your primary care M.D. goes through all the records of your hospital data and follows up on these results.   Also Note the following: If you experience worsening of your admission symptoms, develop shortness of breath, life threatening emergency, suicidal or homicidal thoughts you must seek medical attention immediately by calling 911 or calling your MD immediately  if symptoms less severe.   You must read complete instructions/literature along with all the possible adverse reactions/side effects for all the Medicines you take and that have been  prescribed to you. Take any new Medicines after you have completely understood and accpet all the possible adverse reactions/side effects.    Do not drive when taking Pain medications or sleeping medications (Benzodaizepines)   Do not take more than prescribed Pain, Sleep and Anxiety Medications. It is not advisable to combine anxiety,sleep and pain medications without talking with your primary care practitioner   Special Instructions: If you have smoked or chewed Tobacco  in the last 2 yrs please stop smoking, stop any regular Alcohol  and or any Recreational drug use.   Wear Seat belts while  driving.   Please note: You were cared for by a hospitalist during your hospital stay. Once you are discharged, your primary care physician will handle any further medical issues. Please note that NO REFILLS for any discharge medications will be authorized once you are discharged, as it is imperative that you return to your primary care physician (or establish a relationship with a primary care physician if you do not have one) for your post hospital discharge needs so that they can reassess your need for medications and monitor your lab values.  Time coordinating discharge: 25 minutes  SIGNED:  Pamella Pert, MD, PhD 06/10/2022, 7:10 AM

## 2022-06-12 ENCOUNTER — Other Ambulatory Visit (HOSPITAL_COMMUNITY): Payer: Self-pay

## 2022-06-15 ENCOUNTER — Other Ambulatory Visit (HOSPITAL_COMMUNITY): Payer: Self-pay

## 2022-06-16 NOTE — Telephone Encounter (Addendum)
[facility] f/u: Referral will be closed.  Patient will start Cosentyx.

## 2022-06-17 ENCOUNTER — Telehealth: Payer: Self-pay

## 2022-06-17 ENCOUNTER — Encounter: Payer: Self-pay | Admitting: Internal Medicine

## 2022-06-17 ENCOUNTER — Ambulatory Visit (INDEPENDENT_AMBULATORY_CARE_PROVIDER_SITE_OTHER): Payer: No Typology Code available for payment source | Admitting: Internal Medicine

## 2022-06-17 ENCOUNTER — Other Ambulatory Visit (HOSPITAL_COMMUNITY): Payer: Self-pay

## 2022-06-17 VITALS — BP 128/86 | HR 87 | Temp 98.2°F | Ht 67.0 in | Wt 198.0 lb

## 2022-06-17 DIAGNOSIS — G35 Multiple sclerosis: Secondary | ICD-10-CM

## 2022-06-17 DIAGNOSIS — E049 Nontoxic goiter, unspecified: Secondary | ICD-10-CM | POA: Diagnosis not present

## 2022-06-17 NOTE — Telephone Encounter (Signed)
PA has been started.  Key: [id number]

## 2022-06-17 NOTE — Patient Instructions (Signed)
We will order the thyroid ultrasound.   Work on diet and exercise to help the cholesterol.

## 2022-06-17 NOTE — Progress Notes (Signed)
   Subjective:   Patient ID: Maria Wiley, female    DOB: [birth year], 46 y.o.   MRN: [id number]  HPI The patient is a 46 YO female coming in for hospital follow up. Admitted and then released next day for possible MS flare. She did have numbness type symptoms. Notes indicate migraine but she did not have any headache or migraine type symptoms. Some stress lately but not out of normal.  PMH, FMH, social history reviewed and updated  Review of Systems  Constitutional: Negative.   HENT: Negative.    Eyes: Negative.   Respiratory:  Negative for cough, chest tightness and shortness of breath.   Cardiovascular:  Negative for chest pain, palpitations and leg swelling.  Gastrointestinal:  Negative for abdominal distention, abdominal pain, constipation, diarrhea, nausea and vomiting.  Musculoskeletal: Negative.   Skin: Negative.   Neurological: Negative.   Psychiatric/Behavioral: Negative.      Objective:  Physical Exam Constitutional:      Appearance: She is well-developed.  HENT:     Head: Normocephalic and atraumatic.  Cardiovascular:     Rate and Rhythm: Normal rate and regular rhythm.  Pulmonary:     Effort: Pulmonary effort is normal. No respiratory distress.     Breath sounds: Normal breath sounds. No wheezing or rales.  Abdominal:     General: Bowel sounds are normal. There is no distension.     Palpations: Abdomen is soft.     Tenderness: There is no abdominal tenderness. There is no rebound.  Musculoskeletal:     Cervical back: Normal range of motion.  Skin:    General: Skin is warm and dry.  Neurological:     Mental Status: She is alert and oriented to person, place, and time.     Coordination: Coordination normal.     Vitals:   06/17/22 1015  BP: 128/86  Pulse: 87  Temp: 98.2 F (36.8 C)  TempSrc: Oral  SpO2: 98%  Weight: 198 lb (89.8 kg)  Height: 5\' 7"  (1.702 m)    Assessment & Plan:  Visit time 25 minutes in face to face communication with patient  and coordination of care, additional 10 minutes spent in record review, coordination or care, ordering tests, communicating/referring to other healthcare professionals, documenting in medical records all on the same day of the visit for total time 35 minutes spent on the visit.

## 2022-06-18 ENCOUNTER — Other Ambulatory Visit: Payer: Self-pay

## 2022-06-18 ENCOUNTER — Encounter (HOSPITAL_COMMUNITY): Payer: Self-pay | Admitting: Emergency Medicine

## 2022-06-18 ENCOUNTER — Other Ambulatory Visit (HOSPITAL_COMMUNITY): Payer: Self-pay

## 2022-06-18 ENCOUNTER — Ambulatory Visit (HOSPITAL_COMMUNITY)
Admission: EM | Admit: 2022-06-18 | Discharge: 2022-06-18 | Disposition: A | Discharge status: Home / Self Care | Payer: No Typology Code available for payment source | Attending: Internal Medicine | Admitting: Internal Medicine

## 2022-06-18 DIAGNOSIS — Z1152 Encounter for screening for COVID-19: Secondary | ICD-10-CM | POA: Insufficient documentation

## 2022-06-18 DIAGNOSIS — J00 Acute nasopharyngitis [common cold]: Secondary | ICD-10-CM | POA: Insufficient documentation

## 2022-06-18 DIAGNOSIS — R5383 Other fatigue: Secondary | ICD-10-CM | POA: Insufficient documentation

## 2022-06-18 LAB — RESP PANEL BY RT-PCR (FLU A&B, COVID) ARPGX2
Influenza A by PCR: NEGATIVE
Influenza B by PCR: NEGATIVE
SARS Coronavirus 2 by RT PCR: NEGATIVE

## 2022-06-18 NOTE — ED Triage Notes (Addendum)
Cough, congestion, fatigue, sinus congestion and drainage.  Yesterday throat hurt badly, not as bad today.  Patient has a slight headache.  Has done 2 home covid tests and both negative.  Ear stuffiness  Has taken mucinex sinus

## 2022-06-18 NOTE — Discharge Instructions (Signed)
Please maintain adequate hydration Please continue using over-the-counter medication for nasal congestion symptoms I suspect your symptoms is a side effect of Cosentyx We are testing you for flu/COVID. We will call you with recommendations if labs are abnormal

## 2022-06-19 ENCOUNTER — Encounter: Payer: Self-pay | Admitting: Internal Medicine

## 2022-06-19 ENCOUNTER — Other Ambulatory Visit (HOSPITAL_COMMUNITY): Payer: Self-pay

## 2022-06-19 DIAGNOSIS — E049 Nontoxic goiter, unspecified: Secondary | ICD-10-CM | POA: Insufficient documentation

## 2022-06-19 NOTE — ED Provider Notes (Signed)
[facility]    CSN: [id number] Arrival date & time: 06/18/22  0843      History   Chief Complaint Chief Complaint  Patient presents with   Cough    HPI Maria Wiley is a 46 y.o. female comes to the urgent care with nonproductive cough, nasal congestion, fatigue and a sore throat which started yesterday.  Patient was recently started on Cosentyx.  Above-mentioned symptoms have somewhat improved today.  She denies any shortness of breath, fever or chills.  Cough and nasal congestion is associated with ear fullness.  No ringing.  No rash.  No nausea, vomiting or diarrhea.  No known sick contacts.  Home COVID tests were negative on 2 occasions. HPI  Past Medical History:  Diagnosis Date   Ankylosing spondylitis (HCC)    Chest pain    COVID-19 06/2020   GERD (gastroesophageal reflux disease)    Hypertension    Migraines    Multiple sclerosis (HCC)    RA (rheumatoid arthritis) (HCC)    Thyroid disease    Urinary incontinence     Patient Active Problem List   Diagnosis Date Noted   Enlarged thyroid 06/19/2022   Migraines 06/09/2022   Multiple sclerosis (HCC) 05/11/2022   Vision loss 09/26/2021   B12 deficiency 09/26/2021   Obesity 08/20/2021   Numbness and tingling 02/07/2021   Chronic migraine without aura without status migrainosus, not intractable 05/10/2019   Allergic rhinitis 05/08/2019   Essential hypertension 03/10/2019   Attention and concentration deficit 01/24/2019   Ankylosing spondylitis (HCC) 01/15/2018   Routine general medical examination at a health care facility 01/15/2018   IBS (irritable bowel syndrome) 01/15/2018   RA (rheumatoid arthritis) (HCC)    Hypothyroidism     Past Surgical History:  Procedure Laterality Date   ABDOMINAL HYSTERECTOMY     APPENDECTOMY     CHOLECYSTECTOMY     HERNIA REPAIR  2004   KNEE SURGERY Right 2006    OB History   No obstetric history on file.      Home Medications    Prior to Admission  medications   Medication Sig Start Date End Date Taking? Authorizing Provider  amLODipine (NORVASC) 5 MG tablet Take 1 tablet (5 mg total) by mouth daily. 05/07/22   Myrlene Broker, MD  aspirin EC 81 MG tablet Take 1 tablet (81 mg total) by mouth daily. Swallow whole. 06/10/22   Leatha Gilding, MD  azelastine (ASTELIN) 0.1 % nasal spray Place 1-2 sprays into both nostrils 2 (two) times daily. Patient not taking: Reported on 06/09/2022 01/07/22     butalbital-acetaminophen-caffeine (FIORICET) 50-325-40 MG tablet Take 1 tablet by mouth every 6 (six) hours as needed for headache. Patient not taking: Reported on 06/09/2022 12/19/21   Windell Norfolk, MD  cetirizine (ZYRTEC ALLERGY) 10 MG tablet Take 1 tablet (10 mg total) by mouth daily. Patient not taking: Reported on 06/09/2022 08/27/21   Wallis Bamberg, PA-C  Cholecalciferol (VITAMIN D3) 1000 units CAPS Take 1,000 Units by mouth daily.    [provider]  diclofenac (VOLTAREN) 75 MG EC tablet Take 1 tablet (75 mg total) by mouth 2 (two) times daily for joint/back pain 02/18/22     escitalopram (LEXAPRO) 20 MG tablet Take 1 tablet (20 mg total) by mouth daily. 05/19/22   Myrlene Broker, MD  folic acid (FOLVITE) 1 MG tablet Take 1 tablet (1 mg total) by mouth daily. 01/14/18   Myrlene Broker, MD  gabapentin (NEURONTIN) 300 MG  capsule Take 1 capsule (300 mg total) by mouth at bedtime as needed. Patient taking differently: Take 300 mg by mouth at bedtime. 04/09/22     levothyroxine (SYNTHROID) 25 MCG tablet Take 1 tablet (25 mcg total) by mouth daily before breakfast. 10/31/21 10/31/22  Myrlene Broker, MD  Magnesium 250 MG TABS Take 250 mg by mouth daily.    [provider]  montelukast (SINGULAIR) 10 MG tablet Take 1 tablet (10 mg total) by mouth daily. 02/17/22     Secukinumab (COSENTYX SENSOREADY PEN) 150 MG/ML SOAJ Use as directed under the skin monthly for maintenence dose Patient not taking: Reported on 06/09/2022  06/04/22   Quentin Angst, MD  Semaglutide-Weight Management 2.4 MG/0.75ML SOAJ Inject 2.4 mg into the skin once a week. Patient taking differently: Inject 2.4 mg into the skin every Monday. 04/10/22   Myrlene Broker, MD  solifenacin (VESICARE) 10 MG tablet Take 1 tablet (10 mg total) by mouth daily. 05/07/22   Myrlene Broker, MD  sulfaSALAzine (AZULFIDINE) 500 MG tablet Take 1 tablet (500 mg total) by mouth 2 (two) times daily. 05/28/22     valACYclovir (VALTREX) 1000 MG tablet Take 1 tablet (1,000 mg total) by mouth 2 (two) times daily. Patient not taking: Reported on 06/09/2022 02/01/20   Myrlene Broker, MD    Family History Family History  Problem Relation Age of Onset   CAD Maternal Grandfather    Diabetes Maternal Grandfather    Hearing loss Maternal Grandfather    Heart disease Maternal Grandfather    Hyperlipidemia Maternal Grandfather    Hypertension Mother    Arthritis Mother    Arthritis Maternal Grandmother    Heart disease Maternal Grandmother    Hyperlipidemia Maternal Grandmother    Migraines Neg Hx    Headache Neg Hx     Social History Social History   Tobacco Use   Smoking status: Never   Smokeless tobacco: Never  Vaping Use   Vaping Use: Never used  Substance Use Topics   Alcohol use: Never   Drug use: Never     Allergies   Patient has no known allergies.   Review of Systems Review of Systems As per HPI  Physical Exam Triage Vital Signs ED Triage Vitals  Enc Vitals Group     BP 06/18/22 0855 118/79     Pulse Rate 06/18/22 0855 81     Resp 06/18/22 0855 18     Temp 06/18/22 0855 98.4 F (36.9 C)     Temp Source 06/18/22 0855 Oral     SpO2 06/18/22 0855 95 %     Weight --      Height --      Head Circumference --      Peak Flow --      Pain Score 06/18/22 0851 2     Pain Loc --      Pain Edu? --      Excl. in GC? --    No data found.  Updated Vital Signs BP 118/79 (BP Location: Right Arm)   Pulse 81   Temp  98.4 F (36.9 C) (Oral)   Resp 18   SpO2 95%   Visual Acuity Right Eye Distance:   Left Eye Distance:   Bilateral Distance:    Right Eye Near:   Left Eye Near:    Bilateral Near:     Physical Exam Vitals and nursing note reviewed.  Constitutional:      General: She is  not in acute distress.    Appearance: She is not ill-appearing.  HENT:     Right Ear: Tympanic membrane normal.     Left Ear: Tympanic membrane normal.     Mouth/Throat:     Mouth: Mucous membranes are moist.     Pharynx: No oropharyngeal exudate or posterior oropharyngeal erythema.  Eyes:     Conjunctiva/sclera: Conjunctivae normal.  Cardiovascular:     Rate and Rhythm: Normal rate and regular rhythm.     Pulses: Normal pulses.     Heart sounds: Normal heart sounds.  Pulmonary:     Effort: Pulmonary effort is normal.     Breath sounds: Normal breath sounds.  Musculoskeletal:        General: Normal range of motion.  Neurological:     Mental Status: She is alert.      UC Treatments / Results  Labs (all labs ordered are listed, but only abnormal results are displayed) Labs Reviewed  RESP PANEL BY RT-PCR (FLU A&B, COVID) [id number]    EKG   Radiology No results found.  Procedures Procedures (including critical care time)  Medications Ordered in UC Medications - No data to display  Initial Impression / Assessment and Plan / UC Course  I have reviewed the triage vital signs and the nursing notes.  Pertinent labs & imaging results that were available during my care of the patient were reviewed by me and considered in my medical decision making (see chart for details).     1.  Acute nasopharyngitis with fatigue: COVID-19/flu A/B PCR test has been sent Supportive care with over-the-counter medications Maintain adequate hydration Symptoms may be related to recent usage of Cosentyx. Reassurance given. Recent CBC and CMP were unremarkable so we will not repeat that. Return precautions  given Final Clinical Impressions(s) / UC Diagnoses   Final diagnoses:  Nasopharyngitis acute  Fatigue, unspecified type     Discharge Instructions      Please maintain adequate hydration Please continue using over-the-counter medication for nasal congestion symptoms I suspect your symptoms is a side effect of Cosentyx We are testing you for flu/COVID. We will call you with recommendations if labs are abnormal   ED Prescriptions   None    PDMP not reviewed this encounter.   Merrilee Jansky, MD 06/19/22 [phone]

## 2022-06-19 NOTE — Assessment & Plan Note (Signed)
Following with neurology and symptoms have stabilized since hospital admit. Continue treatment with neurology and she has follow up scheduled.

## 2022-06-19 NOTE — Assessment & Plan Note (Signed)
Noted on recent CT angio head/neck. Ordered US thyroid and we talked about most likely outcomes of serial monitoring, no follow up needed, biopsy of area.

## 2022-06-22 ENCOUNTER — Other Ambulatory Visit (HOSPITAL_COMMUNITY): Payer: Self-pay

## 2022-06-22 MED ORDER — AMANTADINE HCL 100 MG PO CAPS
100.0000 mg | ORAL_CAPSULE | Freq: Two times a day (BID) | ORAL | 5 refills | Status: DC
  Filled 2022-06-22: qty 60, 30d supply, fill #0
  Filled 2022-07-01 – 2022-07-22 (×2): qty 60, 30d supply, fill #1
  Filled 2022-08-22: qty 60, 30d supply, fill #2

## 2022-06-23 ENCOUNTER — Other Ambulatory Visit (HOSPITAL_COMMUNITY): Payer: Self-pay

## 2022-06-23 ENCOUNTER — Other Ambulatory Visit: Payer: Self-pay | Admitting: Pharmacist

## 2022-06-23 ENCOUNTER — Ambulatory Visit
Admission: EM | Admit: 2022-06-23 | Discharge: 2022-06-23 | Disposition: A | Discharge status: Home / Self Care | Payer: No Typology Code available for payment source | Attending: Urgent Care | Admitting: Urgent Care

## 2022-06-23 ENCOUNTER — Encounter: Payer: Self-pay | Admitting: Internal Medicine

## 2022-06-23 DIAGNOSIS — J309 Allergic rhinitis, unspecified: Secondary | ICD-10-CM | POA: Diagnosis not present

## 2022-06-23 DIAGNOSIS — G35 Multiple sclerosis: Secondary | ICD-10-CM | POA: Diagnosis not present

## 2022-06-23 DIAGNOSIS — J019 Acute sinusitis, unspecified: Secondary | ICD-10-CM | POA: Diagnosis not present

## 2022-06-23 MED ORDER — COSENTYX SENSOREADY PEN 150 MG/ML ~~LOC~~ SOAJ
SUBCUTANEOUS | 5 refills | Status: DC
  Filled 2022-06-23: qty 1, fill #0
  Filled 2022-07-07: qty 1, 30d supply, fill #0
  Filled 2022-07-20: qty 1, 28d supply, fill #0
  Filled 2022-07-22: qty 1, 30d supply, fill #0
  Filled 2022-08-28: qty 1, 30d supply, fill #1

## 2022-06-23 MED ORDER — AMOXICILLIN-POT CLAVULANATE 875-125 MG PO TABS
1.0000 | ORAL_TABLET | Freq: Two times a day (BID) | ORAL | 0 refills | Status: DC
  Filled 2022-06-23: qty 14, 7d supply, fill #0

## 2022-06-23 MED ORDER — COSENTYX SENSOREADY PEN 150 MG/ML ~~LOC~~ SOAJ: SUBCUTANEOUS | 5 refills | Status: DC

## 2022-06-23 MED ORDER — COSENTYX SENSOREADY PEN 150 MG/ML ~~LOC~~ SOAJ
SUBCUTANEOUS | 0 refills | Status: DC
  Filled 2022-06-23 – 2022-07-01 (×2): qty 5, fill #0

## 2022-06-23 MED ORDER — COSENTYX SENSOREADY PEN 150 MG/ML ~~LOC~~ SOAJ: SUBCUTANEOUS | 0 refills | Status: DC

## 2022-06-23 NOTE — ED Provider Notes (Signed)
[facility]  Note:  This document was prepared using Conservation officer, historic buildings and may include unintentional dictation errors.  MRN: [id number] DOB: 07-09-1976  Subjective:   Maria Wiley is a 46 y.o. female presenting for 1 week history of persistent sinus congestion, sinus drainage, ear fullness, sinus pressure.  Patient did a COVID-19 test that was negative.  No respiratory symptoms including chest pain, shortness of breath or wheezing.  Patient is immunocompromised as she takes Biologics for her chronic conditions, specifically multiple sclerosis.  She also uses her medications for allergic rhinitis consistently.  No current facility-administered medications for this encounter.  Current Outpatient Medications:    amantadine (SYMMETREL) 100 MG capsule, Take 1 capsule (100 mg total) by mouth 2 (two) times daily., Disp: 30 capsule, Rfl: 5   amLODipine (NORVASC) 5 MG tablet, Take 1 tablet (5 mg total) by mouth daily., Disp: 90 tablet, Rfl: 0   aspirin EC 81 MG tablet, Take 1 tablet (81 mg total) by mouth daily. Swallow whole., Disp: 30 tablet, Rfl: 12   azelastine (ASTELIN) 0.1 % nasal spray, Place 1-2 sprays into both nostrils 2 (two) times daily. (Patient not taking: Reported on 06/09/2022), Disp: 30 mL, Rfl: 3   butalbital-acetaminophen-caffeine (FIORICET) 50-325-40 MG tablet, Take 1 tablet by mouth every 6 (six) hours as needed for headache. (Patient not taking: Reported on 06/09/2022), Disp: 15 tablet, Rfl: 0   cetirizine (ZYRTEC ALLERGY) 10 MG tablet, Take 1 tablet (10 mg total) by mouth daily. (Patient not taking: Reported on 06/09/2022), Disp: 30 tablet, Rfl: 0   Cholecalciferol (VITAMIN D3) 1000 units CAPS, Take 1,000 Units by mouth daily., Disp: , Rfl:    diclofenac (VOLTAREN) 75 MG EC tablet, Take 1 tablet (75 mg total) by mouth 2 (two) times daily for joint/back pain, Disp: 60 tablet, Rfl: 2   escitalopram (LEXAPRO) 20 MG tablet, Take 1 tablet (20  mg total) by mouth daily., Disp: 90 tablet, Rfl: 3   folic acid (FOLVITE) 1 MG tablet, Take 1 tablet (1 mg total) by mouth daily., Disp: 90 tablet, Rfl: 3   gabapentin (NEURONTIN) 300 MG capsule, Take 1 capsule (300 mg total) by mouth at bedtime as needed. (Patient taking differently: Take 300 mg by mouth at bedtime.), Disp: 30 capsule, Rfl: 6   levothyroxine (SYNTHROID) 25 MCG tablet, Take 1 tablet (25 mcg total) by mouth daily before breakfast., Disp: 90 tablet, Rfl: 2   Magnesium 250 MG TABS, Take 250 mg by mouth daily., Disp: , Rfl:    montelukast (SINGULAIR) 10 MG tablet, Take 1 tablet (10 mg total) by mouth daily., Disp: 30 tablet, Rfl: 9   Secukinumab (COSENTYX SENSOREADY PEN) 150 MG/ML SOAJ, Use as directed under the skin monthly for maintenence dose (Patient not taking: Reported on 06/09/2022), Disp: 1 mL, Rfl: 5   Secukinumab (COSENTYX SENSOREADY PEN) 150 MG/ML SOAJ, Inject 150mg /ml Subcutaneous weekly for 5 weeks for loading dose, Disp: 5 mL, Rfl: 0   Secukinumab (COSENTYX SENSOREADY PEN) 150 MG/ML SOAJ, Inject 150mg /ml Subcutaneous montly for maintenence dose 30 days, Disp: 1 mL, Rfl: 5   Semaglutide-Weight Management 2.4 MG/0.75ML SOAJ, Inject 2.4 mg into the skin once a week. (Patient taking differently: Inject 2.4 mg into the skin every Monday.), Disp: 3 mL, Rfl: 5   solifenacin (VESICARE) 10 MG tablet, Take 1 tablet (10 mg total) by mouth daily., Disp: 90 tablet, Rfl: 0   sulfaSALAzine (AZULFIDINE) 500 MG tablet, Take 1 tablet (500 mg total) by mouth  2 (two) times daily., Disp: 60 tablet, Rfl: 2   valACYclovir (VALTREX) 1000 MG tablet, Take 1 tablet (1,000 mg total) by mouth 2 (two) times daily. (Patient not taking: Reported on 06/09/2022), Disp: 60 tablet, Rfl: 3   No Known Allergies  Past Medical History:  Diagnosis Date   Ankylosing spondylitis (HCC)    Chest pain    COVID-19 06/2020   GERD (gastroesophageal reflux disease)    Hypertension    Migraines    Multiple sclerosis  (HCC)    RA (rheumatoid arthritis) (HCC)    Thyroid disease    Urinary incontinence      Past Surgical History:  Procedure Laterality Date   ABDOMINAL HYSTERECTOMY     APPENDECTOMY     CHOLECYSTECTOMY     HERNIA REPAIR  2004   KNEE SURGERY Right 2006    Family History  Problem Relation Age of Onset   CAD Maternal Grandfather    Diabetes Maternal Grandfather    Hearing loss Maternal Grandfather    Heart disease Maternal Grandfather    Hyperlipidemia Maternal Grandfather    Hypertension Mother    Arthritis Mother    Arthritis Maternal Grandmother    Heart disease Maternal Grandmother    Hyperlipidemia Maternal Grandmother    Migraines Neg Hx    Headache Neg Hx     Social History   Tobacco Use   Smoking status: Never   Smokeless tobacco: Never  Vaping Use   Vaping Use: Never used  Substance Use Topics   Alcohol use: Never   Drug use: Never    ROS   Objective:   Vitals: BP 135/86 (BP Location: Left Arm)   Pulse 84   Temp 98.9 F (37.2 C) (Oral)   Resp 16   SpO2 96%   Physical Exam Constitutional:      General: She is not in acute distress.    Appearance: Normal appearance. She is well-developed and normal weight. She is not ill-appearing, toxic-appearing or diaphoretic.  HENT:     Head: Normocephalic and atraumatic.     Right Ear: Tympanic membrane, ear canal and external ear normal. No drainage or tenderness. No middle ear effusion. There is no impacted cerumen. Tympanic membrane is not erythematous or bulging.     Left Ear: Tympanic membrane, ear canal and external ear normal. No drainage or tenderness.  No middle ear effusion. There is no impacted cerumen. Tympanic membrane is not erythematous or bulging.     Nose: Congestion present. No rhinorrhea.     Mouth/Throat:     Mouth: Mucous membranes are moist. No oral lesions.     Pharynx: No pharyngeal swelling, oropharyngeal exudate, posterior oropharyngeal erythema or uvula swelling.     Tonsils: No  tonsillar exudate or tonsillar abscesses.     Comments: Thick streaks of postnasal drainage overlying pharynx. Eyes:     General: No scleral icterus.       Right eye: No discharge.        Left eye: No discharge.     Extraocular Movements: Extraocular movements intact.     Right eye: Normal extraocular motion.     Left eye: Normal extraocular motion.     Conjunctiva/sclera: Conjunctivae normal.  Cardiovascular:     Rate and Rhythm: Normal rate and regular rhythm.     Heart sounds: Normal heart sounds. No murmur heard.    No friction rub. No gallop.  Pulmonary:     Effort: Pulmonary effort is normal. No respiratory distress.  Breath sounds: No stridor. No wheezing, rhonchi or rales.  Chest:     Chest wall: No tenderness.  Musculoskeletal:     Cervical back: Normal range of motion and neck supple.  Lymphadenopathy:     Cervical: No cervical adenopathy.  Skin:    General: Skin is warm and dry.  Neurological:     General: No focal deficit present.     Mental Status: She is alert and oriented to person, place, and time.  Psychiatric:        Mood and Affect: Mood normal.        Behavior: Behavior normal.     Assessment and Plan :   PDMP not reviewed this encounter.  1. Acute sinusitis, recurrence not specified, unspecified location   2. Allergic rhinitis, unspecified seasonality, unspecified trigger   3. Multiple sclerosis (HCC)     Will start empiric treatment for sinusitis with Augmentin.  Recommended supportive care otherwise including the use of oral antihistamine, continue using ibuprofen-Sudafed. Deferred imaging given clear cardiopulmonary exam, hemodynamically stable vital signs.  Consider prednisone course for the sinuses, allergic rhinitis if there is no response come this weekend.  Counseled patient on potential for adverse effects with medications prescribed/recommended today, ER and return-to-clinic precautions discussed, patient verbalized understanding.     Wallis Bamberg, PA-C 06/23/22 1726

## 2022-06-23 NOTE — ED Triage Notes (Signed)
Pt c/o URI sx x 7 days-reports neg covid/flu test-NAD-steady gait

## 2022-06-30 ENCOUNTER — Ambulatory Visit
Admission: RE | Admit: 2022-06-30 | Discharge: 2022-06-30 | Disposition: A | Discharge status: Home / Self Care | Payer: No Typology Code available for payment source | Source: Ambulatory Visit | Attending: Internal Medicine | Admitting: Internal Medicine

## 2022-06-30 ENCOUNTER — Encounter: Payer: Self-pay | Admitting: Internal Medicine

## 2022-06-30 DIAGNOSIS — E049 Nontoxic goiter, unspecified: Secondary | ICD-10-CM

## 2022-07-01 ENCOUNTER — Other Ambulatory Visit: Payer: Self-pay | Admitting: Internal Medicine

## 2022-07-01 ENCOUNTER — Other Ambulatory Visit (HOSPITAL_COMMUNITY): Payer: Self-pay

## 2022-07-01 MED ORDER — MAGNESIUM 250 MG PO TABS
250.0000 mg | ORAL_TABLET | Freq: Every day | ORAL | 0 refills | Status: DC
  Filled 2022-07-01 – 2022-12-02 (×3): qty 90, 90d supply, fill #0

## 2022-07-01 MED ORDER — VITAMIN D3 1000 UNITS PO CAPS
1000.0000 [IU] | ORAL_CAPSULE | Freq: Every day | ORAL | 0 refills | Status: DC
  Filled 2022-07-01: qty 90, 90d supply, fill #0
  Filled 2022-08-02: qty 90, fill #0
  Filled 2022-10-21: qty 90, 90d supply, fill #0
  Filled 2022-12-02 – 2022-12-23 (×2): qty 90, fill #0

## 2022-07-02 ENCOUNTER — Other Ambulatory Visit (HOSPITAL_COMMUNITY): Payer: Self-pay

## 2022-07-06 ENCOUNTER — Other Ambulatory Visit (HOSPITAL_COMMUNITY): Payer: Self-pay

## 2022-07-07 ENCOUNTER — Other Ambulatory Visit (HOSPITAL_COMMUNITY): Payer: Self-pay

## 2022-07-08 ENCOUNTER — Other Ambulatory Visit (HOSPITAL_COMMUNITY): Payer: Self-pay

## 2022-07-08 MED ORDER — GABAPENTIN 100 MG PO CAPS
100.0000 mg | ORAL_CAPSULE | Freq: Two times a day (BID) | ORAL | 6 refills | Status: DC | PRN
  Filled 2022-07-08: qty 60, 30d supply, fill #0
  Filled 2022-08-22: qty 60, 30d supply, fill #1
  Filled 2022-09-21 – 2022-09-24 (×2): qty 60, 30d supply, fill #2
  Filled 2022-11-24: qty 60, 30d supply, fill #3

## 2022-07-08 MED ORDER — GABAPENTIN 300 MG PO CAPS
300.0000 mg | ORAL_CAPSULE | Freq: Every day | ORAL | 6 refills | Status: DC
  Filled 2022-07-08: qty 30, 30d supply, fill #0
  Filled 2022-08-02: qty 30, 30d supply, fill #1
  Filled 2022-08-22 – 2022-09-07 (×4): qty 30, 30d supply, fill #2
  Filled 2022-10-17: qty 30, 30d supply, fill #3

## 2022-07-13 ENCOUNTER — Encounter: Payer: Self-pay | Admitting: Acute Care

## 2022-07-18 ENCOUNTER — Encounter: Payer: Self-pay | Admitting: Acute Care

## 2022-07-20 ENCOUNTER — Other Ambulatory Visit (HOSPITAL_COMMUNITY): Payer: Self-pay

## 2022-07-21 ENCOUNTER — Other Ambulatory Visit (HOSPITAL_COMMUNITY): Payer: Self-pay

## 2022-07-21 ENCOUNTER — Other Ambulatory Visit: Payer: Self-pay

## 2022-07-22 ENCOUNTER — Other Ambulatory Visit (HOSPITAL_COMMUNITY): Payer: Self-pay

## 2022-07-22 ENCOUNTER — Other Ambulatory Visit: Payer: Self-pay

## 2022-07-30 ENCOUNTER — Other Ambulatory Visit: Payer: Self-pay

## 2022-07-30 ENCOUNTER — Ambulatory Visit (HOSPITAL_COMMUNITY)
Admission: EM | Admit: 2022-07-30 | Discharge: 2022-07-30 | Disposition: A | Discharge status: Home / Self Care | Payer: No Typology Code available for payment source | Attending: Internal Medicine | Admitting: Internal Medicine

## 2022-07-30 ENCOUNTER — Other Ambulatory Visit (HOSPITAL_COMMUNITY): Payer: Self-pay

## 2022-07-30 ENCOUNTER — Encounter (HOSPITAL_COMMUNITY): Payer: Self-pay | Admitting: Emergency Medicine

## 2022-07-30 DIAGNOSIS — H1033 Unspecified acute conjunctivitis, bilateral: Secondary | ICD-10-CM | POA: Diagnosis not present

## 2022-07-30 DIAGNOSIS — H5789 Other specified disorders of eye and adnexa: Secondary | ICD-10-CM

## 2022-07-30 MED ORDER — MOXIFLOXACIN HCL 0.5 % OP SOLN
1.0000 [drp] | Freq: Three times a day (TID) | OPHTHALMIC | 0 refills | Status: DC
  Filled 2022-07-30: qty 3, 10d supply, fill #0

## 2022-07-30 NOTE — Discharge Instructions (Addendum)
Moxifloxacin eyedrops 1 drop into each eye 3 times daily.  Warm compresses prior to performing eyedrops to reduce pain and inflammation/drainage. May take Tylenol as needed for pain. Change pillowcase today then again in 3 days to prevent reinfection after using eyedrops.  Wash hands frequently.  If you develop any new or worsening symptoms or do not improve in the next 2 to 3 days, please return.  If your symptoms are severe, please go to the emergency room.  Follow-up with your primary care provider for further evaluation and management of your symptoms as well as ongoing wellness visits.  I hope you feel better!

## 2022-07-30 NOTE — ED Triage Notes (Signed)
Discharge to eyes yesterday morning.  Right eye was red yesterday evening.  Today both eyes red.  Does wear contacts

## 2022-07-30 NOTE — ED Provider Notes (Signed)
[facility]    CSN: [id number] Arrival date & time: 07/30/22  2426      History   Chief Complaint Chief Complaint  Patient presents with   Eye Problem    HPI Maria Wiley is a 46 y.o. female.   Patient presents urgent care for evaluation of right eye redness and irritation that started yesterday, then spread to the left eye starting this morning upon waking.  Both eyes are red, irritated, and she is experiencing foreign body sensation to the eyes without known trauma or injury to the eyes.  She has contact lens wear but has not been wearing them since eye redness and irritation began.  Denies fever, chills, neck pain, blurry vision, decreased visual acuity, headache, dizziness, and pain with eye movement.  Denies posterior bilateral eye pain, redness surrounding the eyes, fever/chills, and recent known sick contacts with similar symptoms.  Eyes are not itchy but she does feel crusting and drainage upon waking.  Has not attempted use of any over-the-counter medications prior to arrival urgent care for symptoms.   Eye Problem   Past Medical History:  Diagnosis Date   Ankylosing spondylitis (HCC)    Chest pain    COVID-19 06/2020   GERD (gastroesophageal reflux disease)    Hypertension    Migraines    Multiple sclerosis (HCC)    RA (rheumatoid arthritis) (HCC)    Thyroid disease    Urinary incontinence     Patient Active Problem List   Diagnosis Date Noted   Enlarged thyroid 06/19/2022   Migraines 06/09/2022   Multiple sclerosis (HCC) 05/11/2022   Vision loss 09/26/2021   B12 deficiency 09/26/2021   Obesity 08/20/2021   Numbness and tingling 02/07/2021   Chronic migraine without aura without status migrainosus, not intractable 05/10/2019   Allergic rhinitis 05/08/2019   Essential hypertension 03/10/2019   Attention and concentration deficit 01/24/2019   Ankylosing spondylitis (HCC) 01/15/2018   Routine general medical examination at a health care  facility 01/15/2018   IBS (irritable bowel syndrome) 01/15/2018   RA (rheumatoid arthritis) (HCC)    Hypothyroidism     Past Surgical History:  Procedure Laterality Date   ABDOMINAL HYSTERECTOMY     APPENDECTOMY     CHOLECYSTECTOMY     HERNIA REPAIR  2004   KNEE SURGERY Right 2006    OB History   No obstetric history on file.      Home Medications    Prior to Admission medications   Medication Sig Start Date End Date Taking? Authorizing Provider  moxifloxacin (VIGAMOX) 0.5 % ophthalmic solution Place 1 drop into both eyes 3 (three) times daily for 7 days 07/30/22  Yes Ellaree Gear, Donavan Burnet, FNP  amantadine (SYMMETREL) 100 MG capsule Take 1 capsule (100 mg total) by mouth 2 (two) times daily. 06/22/22     amLODipine (NORVASC) 5 MG tablet Take 1 tablet (5 mg total) by mouth daily. 05/07/22   Myrlene Broker, MD  amoxicillin-clavulanate (AUGMENTIN) 875-125 MG tablet Take 1 tablet by mouth 2 (two) times daily. 06/23/22   Wallis Bamberg, PA-C  aspirin EC 81 MG tablet Take 1 tablet (81 mg total) by mouth daily. Swallow whole. 06/10/22   Leatha Gilding, MD  azelastine (ASTELIN) 0.1 % nasal spray Place 1-2 sprays into both nostrils 2 (two) times daily. Patient not taking: Reported on 06/09/2022 01/07/22     butalbital-acetaminophen-caffeine (FIORICET) 50-325-40 MG tablet Take 1 tablet by mouth every 6 (six) hours as needed for headache. Patient  not taking: Reported on 06/09/2022 12/19/21   Windell Norfolk, MD  cetirizine (ZYRTEC ALLERGY) 10 MG tablet Take 1 tablet (10 mg total) by mouth daily. Patient not taking: Reported on 06/09/2022 08/27/21   Wallis Bamberg, PA-C  Cholecalciferol (VITAMIN D3) 1000 units CAPS Take 1 tablet (1,000 Units) by mouth daily. 07/01/22   Myrlene Broker, MD  diclofenac (VOLTAREN) 75 MG EC tablet Take 1 tablet (75 mg total) by mouth 2 (two) times daily for joint/back pain 02/18/22     escitalopram (LEXAPRO) 20 MG tablet Take 1 tablet (20 mg total) by mouth  daily. 05/19/22   Myrlene Broker, MD  folic acid (FOLVITE) 1 MG tablet Take 1 tablet (1 mg total) by mouth daily. 01/14/18   Myrlene Broker, MD  gabapentin (NEURONTIN) 100 MG capsule Take 1 capsule (100 mg total) by mouth 2 (two) times daily as needed. 07/08/22     gabapentin (NEURONTIN) 300 MG capsule Take 1 capsule (300 mg total) by mouth at bedtime as needed. Patient taking differently: Take 300 mg by mouth at bedtime. 04/09/22     gabapentin (NEURONTIN) 300 MG capsule Take 1 capsule (300 mg total) by mouth nightly as needed 07/08/22     levothyroxine (SYNTHROID) 25 MCG tablet Take 1 tablet (25 mcg total) by mouth daily before breakfast. 10/31/21 10/31/22  Myrlene Broker, MD  Magnesium 250 MG TABS Take 1 tablet (250 mg total) by mouth daily. 07/01/22   Myrlene Broker, MD  montelukast (SINGULAIR) 10 MG tablet Take 1 tablet (10 mg total) by mouth daily. 02/17/22     Secukinumab (COSENTYX SENSOREADY PEN) 150 MG/ML SOAJ Use as directed under the skin monthly for maintenence dose Patient not taking: Reported on 06/09/2022 06/04/22   Quentin Angst, MD  Secukinumab (COSENTYX SENSOREADY PEN) 150 MG/ML SOAJ Inject 150mg /ml Subcutaneous weekly for 5 weeks for loading dose 06/23/22   Jegede, Olugbemiga E, MD  Secukinumab (COSENTYX SENSOREADY PEN) 150 MG/ML SOAJ Inject 150mg /ml Subcutaneous montly for maintenence dose 30 days 06/23/22   , MD  Semaglutide-Weight Management 2.4 MG/0.75ML SOAJ Inject 2.4 mg into the skin once a week. Patient taking differently: Inject 2.4 mg into the skin every Monday. 04/10/22   Tuesday, MD  solifenacin (VESICARE) 10 MG tablet Take 1 tablet (10 mg total) by mouth daily. 05/07/22   Myrlene Broker, MD  sulfaSALAzine (AZULFIDINE) 500 MG tablet Take 1 tablet (500 mg total) by mouth 2 (two) times daily. 05/28/22     valACYclovir (VALTREX) 1000 MG tablet Take 1 tablet (1,000 mg total) by mouth 2 (two) times  daily. Patient not taking: Reported on 06/09/2022 02/01/20   13/01/2022, MD    Family History Family History  Problem Relation Age of Onset   CAD Maternal Grandfather    Diabetes Maternal Grandfather    Hearing loss Maternal Grandfather    Heart disease Maternal Grandfather    Hyperlipidemia Maternal Grandfather    Hypertension Mother    Arthritis Mother    Arthritis Maternal Grandmother    Heart disease Maternal Grandmother    Hyperlipidemia Maternal Grandmother    Migraines Neg Hx    Headache Neg Hx     Social History Social History   Tobacco Use   Smoking status: Never   Smokeless tobacco: Never  Vaping Use   Vaping Use: Never used  Substance Use Topics   Alcohol use: Never   Drug use: Never     Allergies  Patient has no known allergies.   Review of Systems Review of Systems Per HPI  Physical Exam Triage Vital Signs ED Triage Vitals  Enc Vitals Group     BP 07/30/22 0902 122/84     Pulse Rate 07/30/22 0902 85     Resp 07/30/22 0902 18     Temp 07/30/22 0902 98.2 F (36.8 C)     Temp Source 07/30/22 0902 Oral     SpO2 07/30/22 0902 96 %     Weight --      Height --      Head Circumference --      Peak Flow --      Pain Score 07/30/22 0900 0     Pain Loc --      Pain Edu? --      Excl. in GC? --    No data found.  Updated Vital Signs BP 122/84 (BP Location: Left Arm)   Pulse 85   Temp 98.2 F (36.8 C) (Oral)   Resp 18   SpO2 96%   Visual Acuity Right Eye Distance:   Left Eye Distance:   Bilateral Distance:    Right Eye Near:   Left Eye Near:    Bilateral Near:     Physical Exam Vitals and nursing note reviewed.  Constitutional:      Appearance: She is not ill-appearing or toxic-appearing.  HENT:     Head: Normocephalic and atraumatic.     Right Ear: Hearing and external ear normal.     Left Ear: Hearing and external ear normal.     Nose: Nose normal.     Mouth/Throat:     Lips: Pink.  Eyes:     General: Lids are  normal. Vision grossly intact. Gaze aligned appropriately.        Right eye: No foreign body, discharge or hordeolum.        Left eye: No foreign body, discharge or hordeolum.     Extraocular Movements: Extraocular movements intact.     Conjunctiva/sclera:     Right eye: Right conjunctiva is injected. No chemosis, exudate or hemorrhage.    Left eye: Left conjunctiva is injected. No chemosis, exudate or hemorrhage.    Comments: EOMs intact without dizziness or pain elicited.   Pulmonary:     Effort: Pulmonary effort is normal.  Musculoskeletal:     Cervical back: Neck supple.  Skin:    General: Skin is warm and dry.     Capillary Refill: Capillary refill takes less than 2 seconds.     Findings: No rash.  Neurological:     General: No focal deficit present.     Mental Status: She is alert and oriented to person, place, and time. Mental status is at baseline.     Cranial Nerves: No dysarthria or facial asymmetry.  Psychiatric:        Mood and Affect: Mood normal.        Speech: Speech normal.        Behavior: Behavior normal.        Thought Content: Thought content normal.        Judgment: Judgment normal.      UC Treatments / Results  Labs (all labs ordered are listed, but only abnormal results are displayed) Labs Reviewed - No data to display  EKG   Radiology No results found.  Procedures Procedures (including critical care time)  Medications Ordered in UC Medications - No data to display  Initial Impression /  Assessment and Plan / UC Course  I have reviewed the triage vital signs and the nursing notes.  Pertinent labs & imaging results that were available during my care of the patient were reviewed by me and considered in my medical decision making (see chart for details).   1.  Acute bacterial conjunctivitis of both eyes, eye irritation Presentation is consistent with acute bacterial conjunctivitis of both eyes as symptoms began in 1 eye then migrated to the  other.  She is a contact lens wearer, therefore we will cover for possible Pseudomonas infection.  Moxifloxacin eyedrops 1 drop into each eye 3 times daily for the next 7 days sent to the pharmacy.  Advised to perform warm compresses prior to completing eyedrops to reduce inflammation and drainage to the eyes.  She is to change pillowcase today, then again in 3 days to prevent reinfection after using antibiotics.  Follow-up with ophthalmologist as needed for worsening symptoms or return to urgent care as needed.  Verbalizes understanding and agreement with plan.         Final Clinical Impressions(s) / UC Diagnoses   Final diagnoses:  Acute bacterial conjunctivitis of both eyes  Eye irritation   Discharge Instructions   None    ED Prescriptions     Medication Sig Dispense Auth. Provider   moxifloxacin (VIGAMOX) 0.5 % ophthalmic solution Place 1 drop into both eyes 3 (three) times daily for 7 days 3 mL Carlisle Beers, FNP      PDMP not reviewed this encounter.   Reita May [place], [place] 07/30/22 [phone]

## 2022-07-31 ENCOUNTER — Ambulatory Visit
Admission: RE | Admit: 2022-07-31 | Discharge: 2022-07-31 | Disposition: A | Discharge status: Home / Self Care | Payer: No Typology Code available for payment source | Source: Ambulatory Visit | Attending: Internal Medicine | Admitting: Internal Medicine

## 2022-07-31 DIAGNOSIS — Z1231 Encounter for screening mammogram for malignant neoplasm of breast: Secondary | ICD-10-CM

## 2022-08-02 ENCOUNTER — Other Ambulatory Visit: Payer: Self-pay | Admitting: Internal Medicine

## 2022-08-03 ENCOUNTER — Other Ambulatory Visit: Payer: Self-pay

## 2022-08-04 ENCOUNTER — Other Ambulatory Visit (HOSPITAL_COMMUNITY): Payer: Self-pay

## 2022-08-04 ENCOUNTER — Other Ambulatory Visit: Payer: Self-pay

## 2022-08-04 ENCOUNTER — Encounter: Payer: Self-pay | Admitting: Acute Care

## 2022-08-04 MED ORDER — SOLIFENACIN SUCCINATE 10 MG PO TABS
10.0000 mg | ORAL_TABLET | Freq: Every day | ORAL | 0 refills | Status: DC
  Filled 2022-08-04: qty 90, 90d supply, fill #0

## 2022-08-04 MED ORDER — AMLODIPINE BESYLATE 5 MG PO TABS
5.0000 mg | ORAL_TABLET | Freq: Every day | ORAL | 0 refills | Status: DC
  Filled 2022-08-04: qty 90, 90d supply, fill #0

## 2022-08-05 ENCOUNTER — Other Ambulatory Visit (HOSPITAL_COMMUNITY): Payer: Self-pay

## 2022-08-05 DIAGNOSIS — M450 Ankylosing spondylitis of multiple sites in spine: Secondary | ICD-10-CM | POA: Diagnosis not present

## 2022-08-05 DIAGNOSIS — E669 Obesity, unspecified: Secondary | ICD-10-CM | POA: Diagnosis not present

## 2022-08-05 DIAGNOSIS — Z6830 Body mass index (BMI) 30.0-30.9, adult: Secondary | ICD-10-CM | POA: Diagnosis not present

## 2022-08-05 DIAGNOSIS — Z79899 Other long term (current) drug therapy: Secondary | ICD-10-CM | POA: Diagnosis not present

## 2022-08-05 DIAGNOSIS — Z111 Encounter for screening for respiratory tuberculosis: Secondary | ICD-10-CM | POA: Diagnosis not present

## 2022-08-05 DIAGNOSIS — R5383 Other fatigue: Secondary | ICD-10-CM | POA: Diagnosis not present

## 2022-08-05 DIAGNOSIS — M2550 Pain in unspecified joint: Secondary | ICD-10-CM | POA: Diagnosis not present

## 2022-08-05 DIAGNOSIS — M0609 Rheumatoid arthritis without rheumatoid factor, multiple sites: Secondary | ICD-10-CM | POA: Diagnosis not present

## 2022-08-05 DIAGNOSIS — G35 Multiple sclerosis: Secondary | ICD-10-CM | POA: Diagnosis not present

## 2022-08-05 MED ORDER — COSENTYX UNOREADY 300 MG/2ML ~~LOC~~ SOAJ
300.0000 mg | SUBCUTANEOUS | 3 refills | Status: DC
  Filled 2022-08-05: qty 2, 30d supply, fill #0
  Filled 2022-08-28: qty 2, 28d supply, fill #0

## 2022-08-05 MED ORDER — SULFASALAZINE 500 MG PO TABS
500.0000 mg | ORAL_TABLET | Freq: Two times a day (BID) | ORAL | 2 refills | Status: DC
  Filled 2022-08-05: qty 60, 30d supply, fill #0
  Filled 2022-09-29: qty 60, 30d supply, fill #1
  Filled 2022-11-02: qty 60, 30d supply, fill #2

## 2022-08-05 MED ORDER — DICLOFENAC SODIUM 75 MG PO TBEC
75.0000 mg | DELAYED_RELEASE_TABLET | Freq: Two times a day (BID) | ORAL | 2 refills | Status: DC
  Filled 2022-08-05: qty 60, 30d supply, fill #0
  Filled 2022-09-01: qty 60, 30d supply, fill #1
  Filled 2022-09-29: qty 60, 30d supply, fill #2

## 2022-08-06 ENCOUNTER — Other Ambulatory Visit (HOSPITAL_COMMUNITY): Payer: Self-pay

## 2022-08-07 ENCOUNTER — Other Ambulatory Visit (HOSPITAL_COMMUNITY): Payer: Self-pay

## 2022-08-07 ENCOUNTER — Other Ambulatory Visit: Payer: Self-pay

## 2022-08-22 ENCOUNTER — Other Ambulatory Visit: Payer: Self-pay | Admitting: Internal Medicine

## 2022-08-22 DIAGNOSIS — E039 Hypothyroidism, unspecified: Secondary | ICD-10-CM

## 2022-08-24 ENCOUNTER — Other Ambulatory Visit: Payer: Self-pay

## 2022-08-24 ENCOUNTER — Encounter: Payer: Self-pay | Admitting: Acute Care

## 2022-08-24 ENCOUNTER — Other Ambulatory Visit (HOSPITAL_COMMUNITY): Payer: Self-pay

## 2022-08-24 MED ORDER — LEVOTHYROXINE SODIUM 25 MCG PO TABS
25.0000 ug | ORAL_TABLET | Freq: Every day | ORAL | 3 refills | Status: DC
  Filled 2022-08-24: qty 90, 90d supply, fill #0
  Filled 2022-11-24: qty 90, 90d supply, fill #1
  Filled 2023-02-18: qty 90, 90d supply, fill #2
  Filled 2023-05-24: qty 90, 90d supply, fill #3

## 2022-08-25 DIAGNOSIS — G35 Multiple sclerosis: Secondary | ICD-10-CM | POA: Diagnosis not present

## 2022-08-26 ENCOUNTER — Other Ambulatory Visit (HOSPITAL_COMMUNITY): Payer: Self-pay

## 2022-08-28 ENCOUNTER — Other Ambulatory Visit: Payer: Self-pay

## 2022-08-28 ENCOUNTER — Other Ambulatory Visit (HOSPITAL_COMMUNITY): Payer: Self-pay

## 2022-08-28 ENCOUNTER — Encounter: Payer: Self-pay | Admitting: Acute Care

## 2022-08-28 ENCOUNTER — Other Ambulatory Visit: Payer: Self-pay | Admitting: Pharmacist

## 2022-08-28 MED ORDER — COSENTYX UNOREADY 300 MG/2ML ~~LOC~~ SOAJ
300.0000 mg | SUBCUTANEOUS | 3 refills | Status: DC
  Filled 2022-08-28: qty 2, 28d supply, fill #0
  Filled 2022-09-22: qty 2, 28d supply, fill #1
  Filled 2022-10-21 – 2022-10-23 (×2): qty 2, 28d supply, fill #2
  Filled 2022-11-20: qty 2, 28d supply, fill #3

## 2022-08-31 ENCOUNTER — Other Ambulatory Visit (HOSPITAL_COMMUNITY): Payer: Self-pay

## 2022-09-01 ENCOUNTER — Encounter: Payer: Self-pay | Admitting: Acute Care

## 2022-09-01 ENCOUNTER — Other Ambulatory Visit (HOSPITAL_COMMUNITY): Payer: Self-pay

## 2022-09-02 ENCOUNTER — Other Ambulatory Visit (HOSPITAL_COMMUNITY): Payer: Self-pay

## 2022-09-07 ENCOUNTER — Other Ambulatory Visit: Payer: Self-pay

## 2022-09-07 ENCOUNTER — Other Ambulatory Visit (HOSPITAL_COMMUNITY): Payer: Self-pay

## 2022-09-08 ENCOUNTER — Other Ambulatory Visit (HOSPITAL_COMMUNITY): Payer: Self-pay

## 2022-09-08 ENCOUNTER — Other Ambulatory Visit: Payer: Self-pay

## 2022-09-11 ENCOUNTER — Ambulatory Visit (HOSPITAL_COMMUNITY): Payer: 59

## 2022-09-21 ENCOUNTER — Other Ambulatory Visit (HOSPITAL_COMMUNITY): Payer: Self-pay

## 2022-09-21 ENCOUNTER — Other Ambulatory Visit: Payer: Self-pay

## 2022-09-21 ENCOUNTER — Encounter: Payer: Self-pay | Admitting: Acute Care

## 2022-09-21 MED ORDER — AMANTADINE HCL 100 MG PO CAPS
100.0000 mg | ORAL_CAPSULE | Freq: Two times a day (BID) | ORAL | 5 refills | Status: DC
  Filled 2022-09-21: qty 30, 15d supply, fill #0
  Filled 2022-10-17: qty 30, 15d supply, fill #1
  Filled 2022-11-02: qty 30, 15d supply, fill #2
  Filled 2022-11-24: qty 30, 15d supply, fill #3
  Filled 2022-12-02 – 2022-12-11 (×2): qty 30, 15d supply, fill #4
  Filled 2022-12-23: qty 30, 15d supply, fill #5

## 2022-09-22 ENCOUNTER — Other Ambulatory Visit (HOSPITAL_COMMUNITY): Payer: Self-pay

## 2022-09-22 MED ORDER — GABAPENTIN 300 MG PO CAPS
600.0000 mg | ORAL_CAPSULE | Freq: Every day | ORAL | 6 refills | Status: DC
  Filled 2022-09-22 – 2022-09-24 (×2): qty 60, 30d supply, fill #0
  Filled 2022-10-21 – 2022-10-26 (×3): qty 60, 30d supply, fill #1
  Filled 2022-11-24 – 2022-12-17 (×5): qty 60, 30d supply, fill #2
  Filled 2023-01-15: qty 60, 30d supply, fill #3
  Filled 2023-02-18: qty 60, 30d supply, fill #4
  Filled 2023-03-19: qty 60, 30d supply, fill #5
  Filled 2023-04-26: qty 60, 30d supply, fill #6

## 2022-09-24 ENCOUNTER — Encounter: Payer: Self-pay | Admitting: Acute Care

## 2022-09-24 ENCOUNTER — Other Ambulatory Visit (HOSPITAL_COMMUNITY): Payer: Self-pay

## 2022-09-29 ENCOUNTER — Other Ambulatory Visit (HOSPITAL_COMMUNITY): Payer: Self-pay

## 2022-09-30 ENCOUNTER — Encounter: Payer: Self-pay | Admitting: Acute Care

## 2022-09-30 ENCOUNTER — Ambulatory Visit (INDEPENDENT_AMBULATORY_CARE_PROVIDER_SITE_OTHER): Payer: Commercial Managed Care - PPO | Admitting: Internal Medicine

## 2022-09-30 ENCOUNTER — Encounter: Payer: Self-pay | Admitting: Internal Medicine

## 2022-09-30 ENCOUNTER — Other Ambulatory Visit (HOSPITAL_COMMUNITY): Payer: Self-pay

## 2022-09-30 VITALS — BP 120/86 | HR 74 | Temp 98.4°F | Ht 67.0 in | Wt 188.0 lb

## 2022-09-30 DIAGNOSIS — G4700 Insomnia, unspecified: Secondary | ICD-10-CM | POA: Diagnosis not present

## 2022-09-30 MED ORDER — TRAZODONE HCL 50 MG PO TABS
50.0000 mg | ORAL_TABLET | Freq: Every day | ORAL | 0 refills | Status: DC
  Filled 2022-09-30: qty 90, 90d supply, fill #0

## 2022-09-30 NOTE — Assessment & Plan Note (Signed)
Potentially associated with perimenopause or menopause. S/P hysterectomy so uncertain. No other menopause symptoms. She has night time routine. Advised to try relaxation or meditation to help. Rx trazodone 50 mg qhs to use prn for sleeping.

## 2022-09-30 NOTE — Patient Instructions (Signed)
We have sent in the trazodone that you can take daily as needed for sleep.

## 2022-09-30 NOTE — Progress Notes (Signed)
   Subjective:   Patient ID: Maria Wiley, female    DOB: April 23, 1976, 47 y.o.   MRN: [id number]  Insomnia Primary symptoms: sleep disturbance.     The patient is a 47 YO female coming in for sleep struggles.   Review of Systems  Constitutional: Negative.   HENT: Negative.    Eyes: Negative.   Respiratory:  Negative for cough, chest tightness and shortness of breath.   Cardiovascular:  Negative for chest pain, palpitations and leg swelling.  Gastrointestinal:  Negative for abdominal distention, abdominal pain, constipation, diarrhea, nausea and vomiting.  Musculoskeletal: Negative.   Skin: Negative.   Neurological: Negative.   Psychiatric/Behavioral:  Positive for sleep disturbance. The patient has insomnia.     Objective:  Physical Exam Constitutional:      Appearance: She is well-developed.  HENT:     Head: Normocephalic and atraumatic.  Cardiovascular:     Rate and Rhythm: Normal rate and regular rhythm.  Pulmonary:     Effort: Pulmonary effort is normal. No respiratory distress.     Breath sounds: Normal breath sounds. No wheezing or rales.  Abdominal:     General: Bowel sounds are normal. There is no distension.     Palpations: Abdomen is soft.     Tenderness: There is no abdominal tenderness. There is no rebound.  Musculoskeletal:     Cervical back: Normal range of motion.  Skin:    General: Skin is warm and dry.  Neurological:     Mental Status: She is alert and oriented to person, place, and time.     Coordination: Coordination normal.     Vitals:   09/30/22 0855  BP: 120/86  Pulse: 74  Temp: 98.4 F (36.9 C)  TempSrc: Oral  SpO2: 99%  Weight: 188 lb (85.3 kg)  Height: 5' 7"$  (1.702 m)    Assessment & Plan:

## 2022-10-09 ENCOUNTER — Ambulatory Visit (HOSPITAL_COMMUNITY): Admit: 2022-10-09 | Payer: Commercial Managed Care - PPO | Source: Home / Self Care

## 2022-10-09 ENCOUNTER — Ambulatory Visit (HOSPITAL_COMMUNITY): Payer: Self-pay

## 2022-10-10 ENCOUNTER — Encounter: Payer: Self-pay | Admitting: Acute Care

## 2022-10-21 ENCOUNTER — Other Ambulatory Visit: Payer: Self-pay | Admitting: Internal Medicine

## 2022-10-22 ENCOUNTER — Other Ambulatory Visit: Payer: Self-pay

## 2022-10-22 ENCOUNTER — Other Ambulatory Visit (HOSPITAL_COMMUNITY): Payer: Self-pay

## 2022-10-22 MED ORDER — WEGOVY 2.4 MG/0.75ML ~~LOC~~ SOAJ
2.4000 mg | SUBCUTANEOUS | 5 refills | Status: DC
  Filled 2022-10-22: qty 3, 28d supply, fill #0
  Filled 2022-11-24: qty 3, 28d supply, fill #1
  Filled 2022-12-17 – 2023-01-26 (×2): qty 3, 28d supply, fill #2

## 2022-10-22 MED ORDER — VITAMIN B-12 1000 MCG PO TABS
1000.0000 ug | ORAL_TABLET | Freq: Every day | ORAL | 3 refills | Status: DC
  Filled 2022-10-22 – 2023-01-26 (×2): qty 90, 90d supply, fill #0
  Filled 2023-05-11: qty 90, 90d supply, fill #1
  Filled 2023-08-07 – 2023-08-09 (×2): qty 90, 90d supply, fill #2
  Filled 2023-08-10: qty 100, 100d supply, fill #2

## 2022-10-22 NOTE — Telephone Encounter (Signed)
Last B12 was check 04/09/2022 pls advise on refill.Marland KitchenJohny Chess

## 2022-10-23 ENCOUNTER — Other Ambulatory Visit (HOSPITAL_COMMUNITY): Payer: Self-pay

## 2022-10-23 ENCOUNTER — Other Ambulatory Visit: Payer: Self-pay

## 2022-10-30 ENCOUNTER — Other Ambulatory Visit: Payer: Self-pay

## 2022-10-30 ENCOUNTER — Other Ambulatory Visit (HOSPITAL_COMMUNITY): Payer: Self-pay

## 2022-11-02 ENCOUNTER — Other Ambulatory Visit (HOSPITAL_COMMUNITY): Payer: Self-pay

## 2022-11-02 ENCOUNTER — Other Ambulatory Visit: Payer: Self-pay | Admitting: Internal Medicine

## 2022-11-03 ENCOUNTER — Other Ambulatory Visit (HOSPITAL_COMMUNITY): Payer: Self-pay

## 2022-11-03 ENCOUNTER — Other Ambulatory Visit: Payer: Self-pay

## 2022-11-03 MED ORDER — AMLODIPINE BESYLATE 5 MG PO TABS
5.0000 mg | ORAL_TABLET | Freq: Every day | ORAL | 1 refills | Status: DC
  Filled 2022-11-03: qty 90, 90d supply, fill #0
  Filled 2022-11-24 – 2023-02-18 (×3): qty 90, 90d supply, fill #1

## 2022-11-04 ENCOUNTER — Other Ambulatory Visit (HOSPITAL_COMMUNITY): Payer: Self-pay

## 2022-11-04 DIAGNOSIS — Z79899 Other long term (current) drug therapy: Secondary | ICD-10-CM | POA: Diagnosis not present

## 2022-11-04 DIAGNOSIS — E663 Overweight: Secondary | ICD-10-CM | POA: Diagnosis not present

## 2022-11-04 DIAGNOSIS — Z6829 Body mass index (BMI) 29.0-29.9, adult: Secondary | ICD-10-CM | POA: Diagnosis not present

## 2022-11-04 DIAGNOSIS — G35 Multiple sclerosis: Secondary | ICD-10-CM | POA: Diagnosis not present

## 2022-11-04 DIAGNOSIS — M0609 Rheumatoid arthritis without rheumatoid factor, multiple sites: Secondary | ICD-10-CM | POA: Diagnosis not present

## 2022-11-04 DIAGNOSIS — M2550 Pain in unspecified joint: Secondary | ICD-10-CM | POA: Diagnosis not present

## 2022-11-04 DIAGNOSIS — Z111 Encounter for screening for respiratory tuberculosis: Secondary | ICD-10-CM | POA: Diagnosis not present

## 2022-11-04 DIAGNOSIS — R5383 Other fatigue: Secondary | ICD-10-CM | POA: Diagnosis not present

## 2022-11-04 DIAGNOSIS — M450 Ankylosing spondylitis of multiple sites in spine: Secondary | ICD-10-CM | POA: Diagnosis not present

## 2022-11-04 MED ORDER — SULFASALAZINE 500 MG PO TABS
1000.0000 mg | ORAL_TABLET | Freq: Two times a day (BID) | ORAL | 3 refills | Status: DC
  Filled 2022-11-04 – 2022-12-02 (×4): qty 120, 30d supply, fill #0
  Filled 2023-01-15 – 2023-03-02 (×6): qty 120, 30d supply, fill #1

## 2022-11-11 ENCOUNTER — Encounter: Payer: Self-pay | Admitting: Acute Care

## 2022-11-11 ENCOUNTER — Other Ambulatory Visit (HOSPITAL_COMMUNITY): Payer: Self-pay

## 2022-11-12 ENCOUNTER — Other Ambulatory Visit (HOSPITAL_COMMUNITY): Payer: Self-pay

## 2022-11-17 DIAGNOSIS — G35 Multiple sclerosis: Secondary | ICD-10-CM | POA: Diagnosis not present

## 2022-11-20 ENCOUNTER — Other Ambulatory Visit (HOSPITAL_COMMUNITY): Payer: Self-pay

## 2022-11-24 ENCOUNTER — Other Ambulatory Visit: Payer: Self-pay | Admitting: Internal Medicine

## 2022-11-24 ENCOUNTER — Other Ambulatory Visit (HOSPITAL_COMMUNITY): Payer: Self-pay

## 2022-11-24 ENCOUNTER — Other Ambulatory Visit: Payer: Self-pay

## 2022-11-24 MED ORDER — TRAZODONE HCL 50 MG PO TABS
50.0000 mg | ORAL_TABLET | Freq: Every day | ORAL | 0 refills | Status: DC
  Filled 2022-11-24 – 2022-12-23 (×3): qty 90, 90d supply, fill #0

## 2022-11-24 MED ORDER — DICLOFENAC SODIUM 75 MG PO TBEC
75.0000 mg | DELAYED_RELEASE_TABLET | Freq: Two times a day (BID) | ORAL | 2 refills | Status: DC
  Filled 2022-11-24: qty 60, 30d supply, fill #0
  Filled 2022-12-17: qty 60, 30d supply, fill #1
  Filled 2023-01-16: qty 60, 30d supply, fill #2

## 2022-11-24 MED ORDER — SOLIFENACIN SUCCINATE 10 MG PO TABS
10.0000 mg | ORAL_TABLET | Freq: Every day | ORAL | 0 refills | Status: DC
  Filled 2022-11-24: qty 90, 90d supply, fill #0

## 2022-11-24 MED ORDER — SULFASALAZINE 500 MG PO TABS
1000.0000 mg | ORAL_TABLET | Freq: Two times a day (BID) | ORAL | 3 refills | Status: DC
  Filled 2022-11-24: qty 120, 30d supply, fill #0

## 2022-11-25 ENCOUNTER — Other Ambulatory Visit: Payer: Self-pay

## 2022-11-27 ENCOUNTER — Other Ambulatory Visit (HOSPITAL_COMMUNITY): Payer: Self-pay

## 2022-11-30 ENCOUNTER — Other Ambulatory Visit (HOSPITAL_COMMUNITY): Payer: Self-pay

## 2022-12-02 ENCOUNTER — Other Ambulatory Visit: Payer: Self-pay

## 2022-12-02 ENCOUNTER — Encounter: Payer: Self-pay | Admitting: Acute Care

## 2022-12-02 ENCOUNTER — Other Ambulatory Visit (HOSPITAL_COMMUNITY): Payer: Self-pay

## 2022-12-03 ENCOUNTER — Other Ambulatory Visit (HOSPITAL_COMMUNITY): Payer: Self-pay

## 2022-12-03 ENCOUNTER — Other Ambulatory Visit: Payer: Self-pay

## 2022-12-08 ENCOUNTER — Encounter: Payer: Self-pay | Admitting: Acute Care

## 2022-12-11 ENCOUNTER — Other Ambulatory Visit (HOSPITAL_COMMUNITY): Payer: Self-pay

## 2022-12-11 ENCOUNTER — Other Ambulatory Visit: Payer: Self-pay

## 2022-12-11 DIAGNOSIS — G35 Multiple sclerosis: Secondary | ICD-10-CM | POA: Diagnosis not present

## 2022-12-12 ENCOUNTER — Other Ambulatory Visit (HOSPITAL_COMMUNITY): Payer: Self-pay

## 2022-12-12 MED ORDER — PREDNISONE 20 MG PO TABS
ORAL_TABLET | ORAL | 0 refills | Status: AC
  Filled 2022-12-12: qty 21, 12d supply, fill #0

## 2022-12-14 ENCOUNTER — Other Ambulatory Visit (HOSPITAL_COMMUNITY): Payer: Self-pay

## 2022-12-15 ENCOUNTER — Ambulatory Visit (HOSPITAL_COMMUNITY)
Admission: EM | Admit: 2022-12-15 | Discharge: 2022-12-15 | Disposition: A | Discharge status: Home / Self Care | Payer: Commercial Managed Care - PPO | Attending: Family Medicine | Admitting: Family Medicine

## 2022-12-15 ENCOUNTER — Encounter (HOSPITAL_COMMUNITY): Payer: Self-pay

## 2022-12-15 DIAGNOSIS — Z1152 Encounter for screening for COVID-19: Secondary | ICD-10-CM | POA: Diagnosis not present

## 2022-12-15 DIAGNOSIS — J069 Acute upper respiratory infection, unspecified: Secondary | ICD-10-CM | POA: Insufficient documentation

## 2022-12-15 DIAGNOSIS — J029 Acute pharyngitis, unspecified: Secondary | ICD-10-CM | POA: Diagnosis not present

## 2022-12-15 LAB — POCT RAPID STREP A (OFFICE): Rapid Strep A Screen: NEGATIVE

## 2022-12-15 NOTE — Discharge Instructions (Signed)
You were seen today for upper respiratory symptoms.  Rapid strep was negative.  This will be sent for culture.  Covid swab done today, will be resulted tomorrow.  I recommend rest, fluid, tylenol/motrin for pain as well a salt water gargles.  You will be notified of results if anything is positive.

## 2022-12-15 NOTE — ED Provider Notes (Signed)
[facility]    CSN: [id number] Arrival date & time: 12/15/22  1143      History   Chief Complaint Chief Complaint  Patient presents with   Sore Throat   Cough    HPI Maria Wiley is a 47 y.o. female.   Yesterday started with sore throat, today started with cough.  She has MS, in a flare, and wants to make sure not sick.  No fevers.  Mild runny nose, no congestion.  Does not feel like PND.  No n/v.  No headache.  No wheezing or sob.  No body aches.  No otc meds used.        Past Medical History:  Diagnosis Date   Ankylosing spondylitis (HCC)    Chest pain    COVID-19 06/2020   GERD (gastroesophageal reflux disease)    Hypertension    Migraines    Multiple sclerosis (HCC)    RA (rheumatoid arthritis) (HCC)    Thyroid disease    Urinary incontinence     Patient Active Problem List   Diagnosis Date Noted   Insomnia 09/30/2022   Enlarged thyroid 06/19/2022   Migraines 06/09/2022   Multiple sclerosis (HCC) 05/11/2022   Vision loss 09/26/2021   B12 deficiency 09/26/2021   Obesity 08/20/2021   Numbness and tingling 02/07/2021   Chronic migraine without aura without status migrainosus, not intractable 05/10/2019   Allergic rhinitis 05/08/2019   Essential hypertension 03/10/2019   Attention and concentration deficit 01/24/2019   Ankylosing spondylitis (HCC) 01/15/2018   Routine general medical examination at a health care facility 01/15/2018   IBS (irritable bowel syndrome) 01/15/2018   RA (rheumatoid arthritis) (HCC)    Hypothyroidism     Past Surgical History:  Procedure Laterality Date   ABDOMINAL HYSTERECTOMY     APPENDECTOMY     CHOLECYSTECTOMY     HERNIA REPAIR  2004   KNEE SURGERY Right 2006    OB History   No obstetric history on file.      Home Medications    Prior to Admission medications   Medication Sig Start Date End Date Taking? Authorizing Provider  amantadine (SYMMETREL) 100 MG capsule Take 1 capsule (100 mg  total) by mouth 2 (two) times daily. 09/21/22  Yes   amLODipine (NORVASC) 5 MG tablet Take 1 tablet (5 mg total) by mouth daily. 11/03/22  Yes Myrlene Broker, MD  aspirin EC 81 MG tablet Take 1 tablet (81 mg total) by mouth daily. Swallow whole. 06/10/22  Yes Gherghe, Daylene Katayama, MD  azelastine (ASTELIN) 0.1 % nasal spray Place 1-2 sprays into both nostrils 2 (two) times daily. 01/07/22  Yes   butalbital-acetaminophen-caffeine (FIORICET) 50-325-40 MG tablet Take 1 tablet by mouth every 6 (six) hours as needed for headache. 12/19/21  Yes Camara, Amalia Hailey, MD  cetirizine (ZYRTEC ALLERGY) 10 MG tablet Take 1 tablet (10 mg total) by mouth daily. 08/27/21  Yes Wallis Bamberg, PA-C  Cholecalciferol (VITAMIN D3) 25 MCG (1000 UT) capsule Take 1 tablet (1,000 Units) by mouth daily. 07/01/22  Yes Myrlene Broker, MD  cyanocobalamin (VITAMIN B12) 1000 MCG tablet Take 1 tablet (1,000 mcg total) by mouth daily. 10/22/22  Yes Myrlene Broker, MD  diclofenac (VOLTAREN) 75 MG EC tablet Take 1 tablet (75 mg total) by mouth 2 (two) times daily for joint/back pain 11/24/22  Yes   escitalopram (LEXAPRO) 20 MG tablet Take 1 tablet (20 mg total) by mouth daily. 05/19/22  Yes Myrlene Broker, MD  folic acid (FOLVITE) 1 MG tablet Take 1 tablet (1 mg total) by mouth daily. 01/14/18  Yes Myrlene Broker, MD  gabapentin (NEURONTIN) 100 MG capsule Take 1 capsule (100 mg total) by mouth 2 (two) times daily as needed. 07/08/22  Yes   gabapentin (NEURONTIN) 300 MG capsule Take 1 capsule (300 mg total) by mouth at bedtime as needed. Patient taking differently: Take 300 mg by mouth at bedtime. 04/09/22  Yes   gabapentin (NEURONTIN) 300 MG capsule Take 2 capsules (600 mg total) by mouth at bedtime. 09/22/22  Yes   levothyroxine (SYNTHROID) 25 MCG tablet Take 1 tablet (25 mcg total) by mouth daily before breakfast. 08/24/22 08/24/23 Yes Myrlene Broker, MD  linaclotide [facility]) 145 MCG CAPS capsule Take by mouth. 04/09/22   Yes [provider]  Magnesium 250 MG TABS Take 1 tablet (250 mg total) by mouth daily. 07/01/22  Yes Myrlene Broker, MD  montelukast (SINGULAIR) 10 MG tablet Take 1 tablet (10 mg total) by mouth daily. 02/17/22  Yes   moxifloxacin (VIGAMOX) 0.5 % ophthalmic solution Place 1 drop into both eyes 3 (three) times daily for 7 days 07/30/22  Yes Stanhope, Donavan Burnet, FNP  predniSONE (DELTASONE) 20 MG tablet Take 3 tablets by mouth daily for 2 days, THEN 2.5 tablets daily for 2 days, THEN 2 tablets daily for 2 days, THEN 1.5 tablets daily for 2 days, THEN 1 tablet daily for 2 days, THEN 1/2 tablet daily for 2 days. 12/11/22 12/26/22 Yes   Secukinumab (COSENTYX UNOREADY) 300 MG/2ML SOAJ Inject 300 mg into the skin every 30 (thirty) days. 08/28/22  Yes Quentin Angst, MD  Semaglutide-Weight Management (WEGOVY) 2.4 MG/0.75ML SOAJ Inject 2.4 mg into the skin once a week. 10/22/22  Yes Myrlene Broker, MD  solifenacin (VESICARE) 10 MG tablet Take 1 tablet (10 mg total) by mouth daily. 11/24/22  Yes Myrlene Broker, MD  sulfaSALAzine (AZULFIDINE) 500 MG tablet Take 2 tablets (1,000 mg total) by mouth 2 (two) times daily. 11/04/22  Yes   sulfaSALAzine (AZULFIDINE) 500 MG tablet Take 2 tablets (1,000 mg total) by mouth 2 (two) times daily. 11/24/22  Yes   traZODone (DESYREL) 50 MG tablet Take 1 tablet (50 mg total) by mouth at bedtime. 11/24/22  Yes Myrlene Broker, MD  valACYclovir (VALTREX) 1000 MG tablet Take 1 tablet (1,000 mg total) by mouth 2 (two) times daily. 02/01/20  Yes Myrlene Broker, MD  amoxicillin-clavulanate (AUGMENTIN) 875-125 MG tablet Take 1 tablet by mouth 2 (two) times daily. 06/23/22   Wallis Bamberg, PA-C    Family History Family History  Problem Relation Age of Onset   Hypertension Mother    Arthritis Mother    Arthritis Maternal Grandmother    Heart disease Maternal Grandmother    Hyperlipidemia Maternal Grandmother    CAD Maternal Grandfather     Diabetes Maternal Grandfather    Hearing loss Maternal Grandfather    Heart disease Maternal Grandfather    Hyperlipidemia Maternal Grandfather    Migraines Neg Hx    Headache Neg Hx    Breast cancer Neg Hx     Social History Social History   Tobacco Use   Smoking status: Never   Smokeless tobacco: Never  Vaping Use   Vaping Use: Never used  Substance Use Topics   Alcohol use: Never   Drug use: Never     Allergies   Patient has no known allergies.   Review of Systems Review of Systems  Constitutional: Negative.   HENT:  Positive for congestion and sore throat. Negative for rhinorrhea.   Respiratory:  Positive for cough.   Cardiovascular: Negative.   Gastrointestinal: Negative.   Musculoskeletal: Negative.   Psychiatric/Behavioral: Negative.       Physical Exam Triage Vital Signs ED Triage Vitals  Enc Vitals Group     BP 12/15/22 1240 134/85     Pulse Rate 12/15/22 1240 82     Resp 12/15/22 1240 18     Temp 12/15/22 1240 98.3 F (36.8 C)     Temp Source 12/15/22 1240 Oral     SpO2 12/15/22 1240 99 %     Weight --      Height --      Head Circumference --      Peak Flow --      Pain Score 12/15/22 1237 2     Pain Loc --      Pain Edu? --      Excl. in GC? --    No data found.  Updated Vital Signs BP 134/85 (BP Location: Right Arm)   Pulse 82   Temp 98.3 F (36.8 C) (Oral)   Resp 18   SpO2 99%   Visual Acuity Right Eye Distance:   Left Eye Distance:   Bilateral Distance:    Right Eye Near:   Left Eye Near:    Bilateral Near:     Physical Exam Constitutional:      Appearance: She is well-developed.  HENT:     Head: Normocephalic.     Nose: No congestion or rhinorrhea.     Mouth/Throat:     Mouth: Mucous membranes are moist.     Pharynx: No pharyngeal swelling, oropharyngeal exudate or posterior oropharyngeal erythema.     Tonsils: No tonsillar exudate.  Cardiovascular:     Rate and Rhythm: Normal rate and regular rhythm.     Heart  sounds: Normal heart sounds.  Pulmonary:     Effort: Pulmonary effort is normal.  Musculoskeletal:     Cervical back: Normal range of motion and neck supple.  Lymphadenopathy:     Cervical: No cervical adenopathy.  Neurological:     General: No focal deficit present.     Mental Status: She is alert.  Psychiatric:        Mood and Affect: Mood normal.      UC Treatments / Results  Labs (all labs ordered are listed, but only abnormal results are displayed) Labs Reviewed  SARS CORONAVIRUS 2 (TAT 6-24 HRS)  CULTURE, GROUP A STREP [facility])  POCT RAPID STREP A (OFFICE)    EKG   Radiology No results found.  Procedures Procedures (including critical care time)  Medications Ordered in UC Medications - No data to display  Initial Impression / Assessment and Plan / UC Course  I have reviewed the triage vital signs and the nursing notes.  Pertinent labs & imaging results that were available during my care of the patient were reviewed by me and considered in my medical decision making (see chart for details).  Final Clinical Impressions(s) / UC Diagnoses   Final diagnoses:  Upper respiratory tract infection, unspecified type  Acute pharyngitis, unspecified etiology     Discharge Instructions      You were seen today for upper respiratory symptoms.  Rapid strep was negative.  This will be sent for culture.  Covid swab done today, will be resulted tomorrow.  I recommend rest, fluid, tylenol/motrin for pain as well  a salt water gargles.  You will be notified of results if anything is positive.     ED Prescriptions   None    PDMP not reviewed this encounter.   Jannifer Franklin, MD 12/15/22 1343

## 2022-12-15 NOTE — ED Triage Notes (Signed)
Patient having cough and sore throat onset yesterday. No known sick exposure, Patient does work at the urgent care with sick patients.

## 2022-12-16 ENCOUNTER — Other Ambulatory Visit (HOSPITAL_COMMUNITY): Payer: Self-pay

## 2022-12-16 ENCOUNTER — Other Ambulatory Visit: Payer: Self-pay

## 2022-12-16 LAB — SARS CORONAVIRUS 2 (TAT 6-24 HRS): SARS Coronavirus 2: NEGATIVE

## 2022-12-16 MED ORDER — KESIMPTA 20 MG/0.4ML ~~LOC~~ SOAJ: SUBCUTANEOUS | 0 refills | Status: DC

## 2022-12-16 MED ORDER — KESIMPTA 20 MG/0.4ML ~~LOC~~ SOAJ: SUBCUTANEOUS | 11 refills | Status: DC

## 2022-12-17 ENCOUNTER — Ambulatory Visit: Payer: Commercial Managed Care - PPO | Attending: Internal Medicine | Admitting: Pharmacist

## 2022-12-17 ENCOUNTER — Encounter: Payer: Self-pay | Admitting: Acute Care

## 2022-12-17 ENCOUNTER — Other Ambulatory Visit (HOSPITAL_COMMUNITY): Payer: Self-pay

## 2022-12-17 DIAGNOSIS — Z79899 Other long term (current) drug therapy: Secondary | ICD-10-CM

## 2022-12-17 MED ORDER — KESIMPTA 20 MG/0.4ML ~~LOC~~ SOAJ
SUBCUTANEOUS | 11 refills | Status: DC
  Filled 2022-12-17: qty 0.4, fill #0
  Filled 2023-01-06: qty 0.4, 28d supply, fill #0
  Filled 2023-02-05 – 2023-02-11 (×2): qty 0.4, 28d supply, fill #1
  Filled 2023-03-19: qty 0.4, 28d supply, fill #2
  Filled 2023-04-15: qty 0.4, 28d supply, fill #3
  Filled 2023-05-18: qty 0.4, 28d supply, fill #4
  Filled 2023-06-04: qty 0.4, 28d supply, fill #5
  Filled 2023-07-19: qty 0.4, 28d supply, fill #6
  Filled 2023-08-13: qty 0.4, 28d supply, fill #7
  Filled 2023-09-15: qty 0.4, 28d supply, fill #8
  Filled 2023-10-06: qty 0.4, 28d supply, fill #9
  Filled 2023-11-11: qty 0.4, 28d supply, fill #10
  Filled 2023-12-15: qty 0.4, 28d supply, fill #11

## 2022-12-17 MED ORDER — KESIMPTA 20 MG/0.4ML ~~LOC~~ SOAJ
SUBCUTANEOUS | 0 refills | Status: DC
  Filled 2022-12-17: qty 1.2, 28d supply, fill #0

## 2022-12-17 NOTE — Progress Notes (Signed)
   S: Patient presents today for review of their specialty medication.   Patient is about to start taking Kesimpta for MS. Patient is managed by Dr. Gaynelle Adu for this.   Dosing: 20 mg subcutaneous at weeks 0, 1, and 2 followed by once monthly starting at week 4  Adherence: has not started   Efficacy: has not started   Monitoring:  CBC (monitor for neutropenia, anemia): has a hx of stable B12 deficiency anemia but recent blood counts are stable  Liver function tests: wnl (06/09/2022) Hep B screening: negative (04/09/2022) S/sx of infection: none S/sx of UTI: none Pregnancy: none  Current adverse effects: Injection site reactions: none GI symptoms (diarrhea, nausea): none Headache: none   O:     Lab Results  Component Value Date   WBC 5.2 06/09/2022   HGB 13.3 06/09/2022   HCT 39.0 06/09/2022   MCV 83.5 06/09/2022   PLT 255 06/09/2022      Chemistry      Component Value Date/Time   NA 139 06/09/2022 1148   K 3.9 06/09/2022 1148   CL 106 06/09/2022 1148   CO2 20 (L) 06/09/2022 1130   BUN 9 06/09/2022 1148   CREATININE 0.60 06/09/2022 1148      Component Value Date/Time   CALCIUM 9.2 06/09/2022 1130   ALKPHOS 48 06/09/2022 1130   AST 15 06/09/2022 1130   ALT <5 06/09/2022 1130   BILITOT 0.4 06/09/2022 1130       A/P: 1. Medication review: patient is about to start Kesimpta for MS. Reviewed the medication with her including the following: Ofatumumab is a recombinant human monoclonal IgG1 antibody that binds to human CD20 expressed on B-cells. The precise mechanism by which ofatumumab exerts in therapeutic effects in multiple sclerosis is unknown, but presumed to involve binding to CD20, a cell surface antigen present on pre-B and mature B lymphocytes. Following cell surface binding to B lymphocytes, ofatumumab results in antibody-dependent cellular cytolysis and complement-mediated lysis. It is supplied as a Sensoready Pen. It should be kept at refrigerated  temperature and the pen should be removed to allow to reach room temperature for about 15-30 minutes. It is injected subcutaneously and should be administered in the abdomen, thigh, or outer upper arm. Common adverse effects include injection site reactions, GI side effects, infection, and headache. Patients should be screened for HepB prior to use and liver function tests as well as blood counts should be monitored throughout therapy. No recommendation for any changes at this time.   Butch Penny, PharmD, Patsy [place], CPP Clinical Pharmacist [facility] [address] [phone]

## 2022-12-18 ENCOUNTER — Other Ambulatory Visit: Payer: Self-pay

## 2022-12-18 ENCOUNTER — Other Ambulatory Visit (HOSPITAL_COMMUNITY): Payer: Self-pay

## 2022-12-18 LAB — CULTURE, GROUP A STREP (THRC)

## 2022-12-21 ENCOUNTER — Encounter: Payer: Self-pay | Admitting: Acute Care

## 2022-12-21 ENCOUNTER — Other Ambulatory Visit: Payer: Self-pay

## 2022-12-21 ENCOUNTER — Other Ambulatory Visit (HOSPITAL_COMMUNITY): Payer: Self-pay

## 2022-12-23 ENCOUNTER — Other Ambulatory Visit (HOSPITAL_COMMUNITY): Payer: Self-pay

## 2022-12-23 ENCOUNTER — Other Ambulatory Visit: Payer: Self-pay

## 2022-12-29 ENCOUNTER — Other Ambulatory Visit (HOSPITAL_COMMUNITY): Payer: Self-pay

## 2023-01-05 ENCOUNTER — Other Ambulatory Visit (HOSPITAL_COMMUNITY): Payer: Self-pay

## 2023-01-06 ENCOUNTER — Other Ambulatory Visit (HOSPITAL_COMMUNITY): Payer: Self-pay

## 2023-01-06 ENCOUNTER — Other Ambulatory Visit: Payer: Self-pay

## 2023-01-06 DIAGNOSIS — M450 Ankylosing spondylitis of multiple sites in spine: Secondary | ICD-10-CM | POA: Diagnosis not present

## 2023-01-06 DIAGNOSIS — Z6829 Body mass index (BMI) 29.0-29.9, adult: Secondary | ICD-10-CM | POA: Diagnosis not present

## 2023-01-06 DIAGNOSIS — Z79899 Other long term (current) drug therapy: Secondary | ICD-10-CM | POA: Diagnosis not present

## 2023-01-06 DIAGNOSIS — G35 Multiple sclerosis: Secondary | ICD-10-CM | POA: Diagnosis not present

## 2023-01-06 DIAGNOSIS — R5383 Other fatigue: Secondary | ICD-10-CM | POA: Diagnosis not present

## 2023-01-06 DIAGNOSIS — M0609 Rheumatoid arthritis without rheumatoid factor, multiple sites: Secondary | ICD-10-CM | POA: Diagnosis not present

## 2023-01-06 DIAGNOSIS — E663 Overweight: Secondary | ICD-10-CM | POA: Diagnosis not present

## 2023-01-06 DIAGNOSIS — Z111 Encounter for screening for respiratory tuberculosis: Secondary | ICD-10-CM | POA: Diagnosis not present

## 2023-01-06 MED ORDER — SULFASALAZINE 500 MG PO TABS
1000.0000 mg | ORAL_TABLET | Freq: Two times a day (BID) | ORAL | 3 refills | Status: DC
  Filled 2023-01-06: qty 120, 30d supply, fill #0
  Filled 2023-01-29 – 2023-02-09 (×2): qty 120, 30d supply, fill #1
  Filled 2023-03-09 – 2023-03-29 (×3): qty 120, 30d supply, fill #2
  Filled 2023-04-26: qty 120, 30d supply, fill #3

## 2023-01-08 ENCOUNTER — Other Ambulatory Visit (HOSPITAL_COMMUNITY): Payer: Self-pay

## 2023-01-11 ENCOUNTER — Other Ambulatory Visit (HOSPITAL_COMMUNITY): Payer: Self-pay

## 2023-01-11 DIAGNOSIS — H0288B Meibomian gland dysfunction left eye, upper and lower eyelids: Secondary | ICD-10-CM | POA: Diagnosis not present

## 2023-01-11 DIAGNOSIS — H0288A Meibomian gland dysfunction right eye, upper and lower eyelids: Secondary | ICD-10-CM | POA: Diagnosis not present

## 2023-01-11 DIAGNOSIS — H04123 Dry eye syndrome of bilateral lacrimal glands: Secondary | ICD-10-CM | POA: Diagnosis not present

## 2023-01-11 DIAGNOSIS — H1045 Other chronic allergic conjunctivitis: Secondary | ICD-10-CM | POA: Diagnosis not present

## 2023-01-11 DIAGNOSIS — G35 Multiple sclerosis: Secondary | ICD-10-CM | POA: Diagnosis not present

## 2023-01-15 ENCOUNTER — Other Ambulatory Visit (HOSPITAL_COMMUNITY): Payer: Self-pay

## 2023-01-15 ENCOUNTER — Other Ambulatory Visit: Payer: Self-pay

## 2023-01-15 ENCOUNTER — Encounter: Payer: Self-pay | Admitting: Internal Medicine

## 2023-01-15 MED ORDER — AMANTADINE HCL 100 MG PO CAPS
100.0000 mg | ORAL_CAPSULE | Freq: Two times a day (BID) | ORAL | 5 refills | Status: DC
  Filled 2023-01-15: qty 30, 15d supply, fill #0
  Filled 2023-01-26 – 2023-01-29 (×2): qty 30, 15d supply, fill #1
  Filled 2023-02-21: qty 30, 15d supply, fill #2
  Filled 2023-03-09: qty 30, 15d supply, fill #3
  Filled 2023-03-23: qty 30, 15d supply, fill #4
  Filled 2023-04-05: qty 30, 15d supply, fill #5

## 2023-01-16 ENCOUNTER — Other Ambulatory Visit (HOSPITAL_COMMUNITY): Payer: Self-pay

## 2023-01-18 ENCOUNTER — Encounter: Payer: Self-pay | Admitting: Acute Care

## 2023-01-18 ENCOUNTER — Other Ambulatory Visit (HOSPITAL_COMMUNITY): Payer: Self-pay

## 2023-01-18 MED ORDER — MONTELUKAST SODIUM 10 MG PO TABS
10.0000 mg | ORAL_TABLET | Freq: Every day | ORAL | 0 refills | Status: DC
  Filled 2023-01-18: qty 30, 30d supply, fill #0

## 2023-01-21 ENCOUNTER — Other Ambulatory Visit (HOSPITAL_COMMUNITY): Payer: Self-pay

## 2023-01-22 ENCOUNTER — Ambulatory Visit
Admission: EM | Admit: 2023-01-22 | Discharge: 2023-01-22 | Disposition: A | Discharge status: Home / Self Care | Payer: Commercial Managed Care - PPO | Attending: Urgent Care | Admitting: Urgent Care

## 2023-01-22 ENCOUNTER — Other Ambulatory Visit (HOSPITAL_COMMUNITY): Payer: Self-pay

## 2023-01-22 ENCOUNTER — Other Ambulatory Visit: Payer: Self-pay

## 2023-01-22 DIAGNOSIS — S39012A Strain of muscle, fascia and tendon of lower back, initial encounter: Secondary | ICD-10-CM

## 2023-01-22 MED ORDER — NAPROXEN 500 MG PO TABS
500.0000 mg | ORAL_TABLET | Freq: Two times a day (BID) | ORAL | 0 refills | Status: DC
  Filled 2023-01-22: qty 30, 15d supply, fill #0

## 2023-01-22 MED ORDER — KETOROLAC TROMETHAMINE 30 MG/ML IJ SOLN
30.0000 mg | Freq: Once | INTRAMUSCULAR | Status: AC
  Administered 2023-01-22: 30 mg via INTRAMUSCULAR

## 2023-01-22 MED ORDER — CYCLOBENZAPRINE HCL 5 MG PO TABS
5.0000 mg | ORAL_TABLET | Freq: Every evening | ORAL | 0 refills | Status: AC | PRN
  Filled 2023-01-22: qty 30, 30d supply, fill #0

## 2023-01-22 MED ORDER — FAMOTIDINE 20 MG PO TABS
20.0000 mg | ORAL_TABLET | Freq: Two times a day (BID) | ORAL | 0 refills | Status: DC
  Filled 2023-01-22: qty 30, 15d supply, fill #0

## 2023-01-22 NOTE — ED Provider Notes (Signed)
[facility]  Note:  This document was prepared using Conservation officer, historic buildings and may include unintentional dictation errors.  MRN: [id number] DOB: 1976-02-07  Subjective:   Maria Wiley is a 47 y.o. female presenting for 1 day history of persistent moderate lower right-sided back pain.  Symptoms were aggravated by lifting a heavy table with items on it a few times while at work.  No fall, trauma, numbness or tingling, saddle paresthesia, changes to bowel or urinary habits, radicular symptoms.   No current facility-administered medications for this encounter.  Current Outpatient Medications:    amantadine (SYMMETREL) 100 MG capsule, Take 1 capsule (100 mg total) by mouth 2 (two) times daily., Disp: 30 capsule, Rfl: 5   amLODipine (NORVASC) 5 MG tablet, Take 1 tablet (5 mg total) by mouth daily., Disp: 90 tablet, Rfl: 1   amoxicillin-clavulanate (AUGMENTIN) 875-125 MG tablet, Take 1 tablet by mouth 2 (two) times daily., Disp: 14 tablet, Rfl: 0   aspirin EC 81 MG tablet, Take 1 tablet (81 mg total) by mouth daily. Swallow whole., Disp: 30 tablet, Rfl: 12   azelastine (ASTELIN) 0.1 % nasal spray, Place 1-2 sprays into both nostrils 2 (two) times daily., Disp: 30 mL, Rfl: 3   butalbital-acetaminophen-caffeine (FIORICET) 50-325-40 MG tablet, Take 1 tablet by mouth every 6 (six) hours as needed for headache., Disp: 15 tablet, Rfl: 0   cetirizine (ZYRTEC ALLERGY) 10 MG tablet, Take 1 tablet (10 mg total) by mouth daily., Disp: 30 tablet, Rfl: 0   Cholecalciferol (VITAMIN D3) 1000 units CAPS, Take 1 tablet (1,000 Units) by mouth daily., Disp: 90 capsule, Rfl: 0   cyanocobalamin (VITAMIN B12) 1000 MCG tablet, Take 1 tablet (1,000 mcg total) by mouth daily., Disp: 90 tablet, Rfl: 3   diclofenac (VOLTAREN) 75 MG EC tablet, Take 1 tablet (75 mg total) by mouth 2 (two) times daily for joint/back pain, Disp: 60 tablet, Rfl: 2   escitalopram (LEXAPRO) 20 MG tablet, Take  1 tablet (20 mg total) by mouth daily., Disp: 90 tablet, Rfl: 3   folic acid (FOLVITE) 1 MG tablet, Take 1 tablet (1 mg total) by mouth daily., Disp: 90 tablet, Rfl: 3   gabapentin (NEURONTIN) 100 MG capsule, Take 1 capsule (100 mg total) by mouth 2 (two) times daily as needed., Disp: 60 capsule, Rfl: 6   gabapentin (NEURONTIN) 300 MG capsule, Take 1 capsule (300 mg total) by mouth at bedtime as needed. (Patient taking differently: Take 300 mg by mouth at bedtime.), Disp: 30 capsule, Rfl: 6   gabapentin (NEURONTIN) 300 MG capsule, Take 2 capsules (600 mg total) by mouth at bedtime., Disp: 60 capsule, Rfl: 6   levothyroxine (SYNTHROID) 25 MCG tablet, Take 1 tablet (25 mcg total) by mouth daily before breakfast., Disp: 90 tablet, Rfl: 3   linaclotide (LINZESS) 145 MCG CAPS capsule, Take by mouth., Disp: , Rfl:    Magnesium 250 MG TABS, Take 1 tablet (250 mg total) by mouth daily., Disp: 90 tablet, Rfl: 0   montelukast (SINGULAIR) 10 MG tablet, Take 1 tablet (10 mg total) by mouth daily., Disp: 30 tablet, Rfl: 0   moxifloxacin (VIGAMOX) 0.5 % ophthalmic solution, Place 1 drop into both eyes 3 (three) times daily for 7 days, Disp: 3 mL, Rfl: 0   Ofatumumab (KESIMPTA) 20 MG/0.4ML SOAJ, Inject the contents of 1 pen into the skin on weeks 0, 1, and 2. Then begin monthly maintenance dosing starting on week 4., Disp: 1.2 mL, Rfl: 0  Ofatumumab (KESIMPTA) 20 MG/0.4ML SOAJ, Inject 0.4 mLs (20 mg total) into the skin every 28 days., Disp: 0.4 mL, Rfl: 11   Secukinumab (COSENTYX UNOREADY) 300 MG/2ML SOAJ, Inject 300 mg into the skin every 30 (thirty) days., Disp: 2 mL, Rfl: 3   Semaglutide-Weight Management (WEGOVY) 2.4 MG/0.75ML SOAJ, Inject 2.4 mg into the skin once a week., Disp: 3 mL, Rfl: 5   solifenacin (VESICARE) 10 MG tablet, Take 1 tablet (10 mg total) by mouth daily., Disp: 90 tablet, Rfl: 0   sulfaSALAzine (AZULFIDINE) 500 MG tablet, Take 2 tablets (1,000 mg total) by mouth 2 (two) times daily., Disp:  120 tablet, Rfl: 3   sulfaSALAzine (AZULFIDINE) 500 MG tablet, Take 2 tablets (1,000 mg total) by mouth 2 (two) times daily., Disp: 120 tablet, Rfl: 3   sulfaSALAzine (AZULFIDINE) 500 MG tablet, Take 2 tablets (1,000 mg total) by mouth 2 (two) times daily., Disp: 120 tablet, Rfl: 3   traZODone (DESYREL) 50 MG tablet, Take 1 tablet (50 mg total) by mouth at bedtime., Disp: 90 tablet, Rfl: 0   valACYclovir (VALTREX) 1000 MG tablet, Take 1 tablet (1,000 mg total) by mouth 2 (two) times daily., Disp: 60 tablet, Rfl: 3   No Known Allergies  Past Medical History:  Diagnosis Date   Ankylosing spondylitis (HCC)    Chest pain    COVID-19 06/2020   GERD (gastroesophageal reflux disease)    Hypertension    Migraines    Multiple sclerosis (HCC)    RA (rheumatoid arthritis) (HCC)    Thyroid disease    Urinary incontinence      Past Surgical History:  Procedure Laterality Date   ABDOMINAL HYSTERECTOMY     APPENDECTOMY     CHOLECYSTECTOMY     HERNIA REPAIR  2004   KNEE SURGERY Right 2006    Family History  Problem Relation Age of Onset   Hypertension Mother    Arthritis Mother    Arthritis Maternal Grandmother    Heart disease Maternal Grandmother    Hyperlipidemia Maternal Grandmother    CAD Maternal Grandfather    Diabetes Maternal Grandfather    Hearing loss Maternal Grandfather    Heart disease Maternal Grandfather    Hyperlipidemia Maternal Grandfather    Migraines Neg Hx    Headache Neg Hx    Breast cancer Neg Hx     Social History   Tobacco Use   Smoking status: Never   Smokeless tobacco: Never  Vaping Use   Vaping Use: Never used  Substance Use Topics   Alcohol use: Never   Drug use: Never    ROS   Objective:   Vitals: BP 118/89 (BP Location: Right Arm)   Pulse 71   Temp 98 F (36.7 C) (Oral)   Resp 16   SpO2 97%   Physical Exam Constitutional:      General: She is not in acute distress.    Appearance: Normal appearance. She is well-developed. She  is not ill-appearing, toxic-appearing or diaphoretic.  HENT:     Head: Normocephalic and atraumatic.     Nose: Nose normal.     Mouth/Throat:     Mouth: Mucous membranes are moist.  Eyes:     General: No scleral icterus.       Right eye: No discharge.        Left eye: No discharge.     Extraocular Movements: Extraocular movements intact.  Cardiovascular:     Rate and Rhythm: Normal rate.  Pulmonary:  Effort: Pulmonary effort is normal.  Musculoskeletal:     Lumbar back: Tenderness (focal over area outlined) present. No swelling, edema, deformity, signs of trauma, lacerations, spasms or bony tenderness. Normal range of motion. Negative right straight leg raise test and negative left straight leg raise test. No scoliosis.       Back:  Skin:    General: Skin is warm and dry.  Neurological:     General: No focal deficit present.     Mental Status: She is alert and oriented to person, place, and time.     Motor: No weakness.     Coordination: Coordination normal.     Gait: Gait normal.     Deep Tendon Reflexes: Reflexes normal.  Psychiatric:        Mood and Affect: Mood normal.        Behavior: Behavior normal.        Thought Content: Thought content normal.        Judgment: Judgment normal.    IM Toradol 30 mg administered in clinic.    Assessment and Plan :   PDMP not reviewed this encounter.  1. Lumbar strain, initial encounter    Will manage conservatively for back strain with NSAID and muscle relaxant, rest and modification of physical activity.  Anticipatory guidance provided.  Counseled patient on potential for adverse effects with medications prescribed/recommended today, ER and return-to-clinic precautions discussed, patient verbalized understanding.    Wallis Bamberg, PA-C 01/22/23 1139

## 2023-01-22 NOTE — Discharge Instructions (Addendum)
Do your back stretches using 3 sets at a 15 count each. You can use either diclofenac or naproxen. If you take naproxen, use famotidine to protect the stomach lining. Use Flexeril at night.

## 2023-01-22 NOTE — ED Triage Notes (Signed)
Pt c/o right lower back pain started yesterday after lifting furniture-NAD-steady gait

## 2023-01-26 ENCOUNTER — Encounter: Payer: Self-pay | Admitting: Acute Care

## 2023-01-26 ENCOUNTER — Other Ambulatory Visit (HOSPITAL_COMMUNITY): Payer: Self-pay

## 2023-01-27 ENCOUNTER — Other Ambulatory Visit (HOSPITAL_COMMUNITY): Payer: Self-pay

## 2023-01-28 ENCOUNTER — Other Ambulatory Visit (HOSPITAL_COMMUNITY): Payer: Self-pay

## 2023-01-29 ENCOUNTER — Other Ambulatory Visit (HOSPITAL_COMMUNITY): Payer: Self-pay

## 2023-02-01 ENCOUNTER — Other Ambulatory Visit (HOSPITAL_COMMUNITY): Payer: Self-pay

## 2023-02-01 ENCOUNTER — Other Ambulatory Visit: Payer: Self-pay

## 2023-02-02 ENCOUNTER — Other Ambulatory Visit: Payer: Self-pay

## 2023-02-02 ENCOUNTER — Other Ambulatory Visit (HOSPITAL_COMMUNITY): Payer: Self-pay

## 2023-02-03 ENCOUNTER — Other Ambulatory Visit (HOSPITAL_COMMUNITY): Payer: Self-pay

## 2023-02-05 ENCOUNTER — Other Ambulatory Visit (HOSPITAL_COMMUNITY): Payer: Self-pay

## 2023-02-09 ENCOUNTER — Other Ambulatory Visit (HOSPITAL_COMMUNITY)
Admission: RE | Admit: 2023-02-09 | Discharge: 2023-02-09 | Disposition: A | Discharge status: Home / Self Care | Payer: Commercial Managed Care - PPO | Source: Ambulatory Visit | Attending: Oncology | Admitting: Oncology

## 2023-02-09 ENCOUNTER — Other Ambulatory Visit: Payer: Self-pay | Admitting: Oncology

## 2023-02-09 ENCOUNTER — Other Ambulatory Visit (HOSPITAL_COMMUNITY): Payer: Self-pay

## 2023-02-09 DIAGNOSIS — Z006 Encounter for examination for normal comparison and control in clinical research program: Secondary | ICD-10-CM

## 2023-02-10 ENCOUNTER — Other Ambulatory Visit (HOSPITAL_COMMUNITY): Payer: Self-pay

## 2023-02-11 ENCOUNTER — Other Ambulatory Visit: Payer: Self-pay

## 2023-02-11 ENCOUNTER — Other Ambulatory Visit (HOSPITAL_COMMUNITY): Payer: Self-pay

## 2023-02-12 ENCOUNTER — Other Ambulatory Visit: Payer: Self-pay

## 2023-02-12 ENCOUNTER — Other Ambulatory Visit (HOSPITAL_COMMUNITY): Payer: Self-pay

## 2023-02-12 ENCOUNTER — Other Ambulatory Visit (HOSPITAL_COMMUNITY)
Admission: RE | Admit: 2023-02-12 | Discharge: 2023-02-12 | Disposition: A | Discharge status: Home / Self Care | Payer: Commercial Managed Care - PPO | Source: Ambulatory Visit | Attending: Oncology | Admitting: Oncology

## 2023-02-12 DIAGNOSIS — Z006 Encounter for examination for normal comparison and control in clinical research program: Secondary | ICD-10-CM | POA: Insufficient documentation

## 2023-02-18 ENCOUNTER — Other Ambulatory Visit (HOSPITAL_COMMUNITY): Payer: Self-pay

## 2023-02-19 ENCOUNTER — Other Ambulatory Visit: Payer: Self-pay

## 2023-02-19 ENCOUNTER — Other Ambulatory Visit (HOSPITAL_COMMUNITY): Payer: Self-pay

## 2023-02-19 MED ORDER — MONTELUKAST SODIUM 10 MG PO TABS
10.0000 mg | ORAL_TABLET | Freq: Every day | ORAL | 0 refills | Status: DC
  Filled 2023-02-19: qty 30, 30d supply, fill #0

## 2023-02-21 ENCOUNTER — Other Ambulatory Visit: Payer: Self-pay | Admitting: Internal Medicine

## 2023-02-21 ENCOUNTER — Other Ambulatory Visit (HOSPITAL_COMMUNITY): Payer: Self-pay

## 2023-02-22 ENCOUNTER — Other Ambulatory Visit (HOSPITAL_COMMUNITY): Payer: Self-pay

## 2023-02-22 ENCOUNTER — Other Ambulatory Visit: Payer: Self-pay

## 2023-02-22 MED ORDER — TRAZODONE HCL 50 MG PO TABS
50.0000 mg | ORAL_TABLET | Freq: Every day | ORAL | 0 refills | Status: DC
  Filled 2023-02-22: qty 90, 90d supply, fill #0

## 2023-02-22 MED ORDER — DICLOFENAC SODIUM 75 MG PO TBEC
75.0000 mg | DELAYED_RELEASE_TABLET | Freq: Two times a day (BID) | ORAL | 2 refills | Status: DC
  Filled 2023-02-22: qty 60, 30d supply, fill #0
  Filled 2023-03-23: qty 60, 30d supply, fill #1
  Filled 2023-04-26: qty 60, 30d supply, fill #2

## 2023-02-23 ENCOUNTER — Encounter: Payer: Self-pay | Admitting: Internal Medicine

## 2023-02-23 ENCOUNTER — Other Ambulatory Visit (HOSPITAL_COMMUNITY): Payer: Self-pay

## 2023-02-24 ENCOUNTER — Other Ambulatory Visit: Payer: Self-pay

## 2023-02-24 ENCOUNTER — Other Ambulatory Visit (HOSPITAL_COMMUNITY): Payer: Self-pay

## 2023-02-24 MED ORDER — TRAZODONE HCL 50 MG PO TABS
100.0000 mg | ORAL_TABLET | Freq: Every day | ORAL | 3 refills | Status: DC
  Filled 2023-02-24 (×2): qty 180, 90d supply, fill #0
  Filled 2023-05-14 – 2023-05-28 (×2): qty 180, 90d supply, fill #1
  Filled 2023-08-26: qty 180, 90d supply, fill #2
  Filled 2023-11-23: qty 180, 90d supply, fill #3

## 2023-03-02 ENCOUNTER — Ambulatory Visit (INDEPENDENT_AMBULATORY_CARE_PROVIDER_SITE_OTHER): Payer: Commercial Managed Care - PPO | Admitting: Orthopaedic Surgery

## 2023-03-02 ENCOUNTER — Other Ambulatory Visit (INDEPENDENT_AMBULATORY_CARE_PROVIDER_SITE_OTHER): Payer: Commercial Managed Care - PPO

## 2023-03-02 ENCOUNTER — Other Ambulatory Visit (HOSPITAL_COMMUNITY): Payer: Self-pay

## 2023-03-02 DIAGNOSIS — M25511 Pain in right shoulder: Secondary | ICD-10-CM

## 2023-03-02 MED ORDER — LIDOCAINE HCL 1 % IJ SOLN
3.0000 mL | INTRAMUSCULAR | Status: AC | PRN
Start: 2023-03-02 — End: 2023-03-02
  Administered 2023-03-02: 3 mL

## 2023-03-02 MED ORDER — METHYLPREDNISOLONE ACETATE 40 MG/ML IJ SUSP
40.0000 mg | INTRAMUSCULAR | Status: AC | PRN
Start: 2023-03-02 — End: 2023-03-02
  Administered 2023-03-02: 40 mg via INTRA_ARTICULAR

## 2023-03-02 MED ORDER — BUPIVACAINE HCL 0.5 % IJ SOLN
3.0000 mL | INTRAMUSCULAR | Status: AC | PRN
Start: 2023-03-02 — End: 2023-03-02
  Administered 2023-03-02: 3 mL via INTRA_ARTICULAR

## 2023-03-02 NOTE — Progress Notes (Signed)
Office Visit Note   Patient: Maria Wiley           Date of Birth: 1975-09-09           MRN: [id number] Visit Date: 03/02/2023              Requested by: Myrlene Broker, MD [address] [place],  [place] [address] PCP: Myrlene Broker, MD   Assessment & Plan: Visit Diagnoses:  1. Acute pain of right shoulder     Plan: Impression is 47 year old female with chronic right shoulder pain probable impingement syndrome.  Disease process explained and treatment options were reviewed.  Patient would like to try subacromial injection as well as home exercise program.  She will let me know if symptoms do not improve after 6 weeks.  Plan will be for an MRI at that point.  Follow-Up Instructions: No follow-ups on file.   Orders:  Orders Placed This Encounter  Procedures  . XR Shoulder Right   No orders of the defined types were placed in this encounter.     Procedures: Large Joint Inj: R subacromial bursa on 03/02/2023 11:39 AM Indications: pain Details: 22 G needle  Arthrogram: No  Medications: 3 mL lidocaine 1 %; 3 mL bupivacaine 0.5 %; 40 mg methylPREDNISolone acetate 40 MG/ML Outcome: tolerated well, no immediate complications Consent was given by the patient. Patient was prepped and draped in the usual sterile fashion.     Clinical Data: No additional findings.   Subjective: Chief Complaint  Patient presents with  . Right Shoulder - Pain    HPI Maria Wiley is a 47 year old female mother of Maria Wiley who is also here for evaluation of his elbow today.  She is a very pleasant 47 year old female who comes in for evaluation of chronic right shoulder pain for about 6 weeks that has gotten worse.  She reports throbbing at night.  Denies any injuries or recent changes in activity.  She has noticed some decreased range of motion and the pain wakes her up at night.  She takes naproxen twice a day.  Denies any radicular symptoms.  She does have a diagnosis of rheumatoid  and MS. Review of Systems  Constitutional: Negative.   HENT: Negative.    Eyes: Negative.   Respiratory: Negative.    Cardiovascular: Negative.   Endocrine: Negative.   Musculoskeletal: Negative.   Neurological: Negative.   Hematological: Negative.   Psychiatric/Behavioral: Negative.    All other systems reviewed and are negative.    Objective: Vital Signs: There were no vitals taken for this visit.  Physical Exam Vitals and nursing note reviewed.  Constitutional:      Appearance: She is well-developed.  HENT:     Head: Atraumatic.     Nose: Nose normal.  Eyes:     Extraocular Movements: Extraocular movements intact.  Cardiovascular:     Pulses: Normal pulses.  Pulmonary:     Effort: Pulmonary effort is normal.  Abdominal:     Palpations: Abdomen is soft.  Musculoskeletal:     Cervical back: Neck supple.  Skin:    General: Skin is warm.     Capillary Refill: Capillary refill takes less than 2 seconds.  Neurological:     Mental Status: She is alert. Mental status is at baseline.  Psychiatric:        Behavior: Behavior normal.        Thought Content: Thought content normal.        Judgment: Judgment normal.  Ortho Exam Examination of right shoulder shows moderate pain with extremes of range of motion.  Pain with impingement.  Manual muscle testing of the rotator cuff is normal with slight pain. Specialty Comments:  No specialty comments available.  Imaging: XR Shoulder Right  Result Date: 03/02/2023 X-rays of the right shoulder demonstrate a large anterior acromial spur that could predispose to impingement syndrome.    PMFS History: Patient Active Problem List   Diagnosis Date Noted  . Insomnia 09/30/2022  . Enlarged thyroid 06/19/2022  . Migraines 06/09/2022  . Multiple sclerosis (HCC) 05/11/2022  . Vision loss 09/26/2021  . B12 deficiency 09/26/2021  . Obesity 08/20/2021  . Numbness and tingling 02/07/2021  . Chronic migraine without aura without  status migrainosus, not intractable 05/10/2019  . Allergic rhinitis 05/08/2019  . Essential hypertension 03/10/2019  . Attention and concentration deficit 01/24/2019  . Ankylosing spondylitis (HCC) 01/15/2018  . Routine general medical examination at a health care facility 01/15/2018  . IBS (irritable bowel syndrome) 01/15/2018  . RA (rheumatoid arthritis) (HCC)   . Hypothyroidism    Past Medical History:  Diagnosis Date  . Ankylosing spondylitis (HCC)   . Chest pain   . COVID-19 06/2020  . GERD (gastroesophageal reflux disease)   . Hypertension   . Migraines   . Multiple sclerosis (HCC)   . RA (rheumatoid arthritis) (HCC)   . Thyroid disease   . Urinary incontinence     Family History  Problem Relation Age of Onset  . Hypertension Mother   . Arthritis Mother   . Arthritis Maternal Grandmother   . Heart disease Maternal Grandmother   . Hyperlipidemia Maternal Grandmother   . CAD Maternal Grandfather   . Diabetes Maternal Grandfather   . Hearing loss Maternal Grandfather   . Heart disease Maternal Grandfather   . Hyperlipidemia Maternal Grandfather   . Migraines Neg Hx   . Headache Neg Hx   . Breast cancer Neg Hx     Past Surgical History:  Procedure Laterality Date  . ABDOMINAL HYSTERECTOMY    . APPENDECTOMY    . CHOLECYSTECTOMY    . HERNIA REPAIR  2004  . KNEE SURGERY Right 2006   Social History   Occupational History  . Not on file  Tobacco Use  . Smoking status: Never  . Smokeless tobacco: Never  Vaping Use  . Vaping status: Never Used  Substance and Sexual Activity  . Alcohol use: Never  . Drug use: Never  . Sexual activity: Yes    Birth control/protection: Surgical

## 2023-03-03 ENCOUNTER — Other Ambulatory Visit: Payer: Self-pay

## 2023-03-04 ENCOUNTER — Other Ambulatory Visit: Payer: Self-pay

## 2023-03-09 ENCOUNTER — Other Ambulatory Visit (HOSPITAL_COMMUNITY): Payer: Self-pay

## 2023-03-09 ENCOUNTER — Other Ambulatory Visit: Payer: Self-pay

## 2023-03-12 ENCOUNTER — Other Ambulatory Visit (HOSPITAL_COMMUNITY): Payer: Self-pay

## 2023-03-15 ENCOUNTER — Other Ambulatory Visit (HOSPITAL_COMMUNITY): Payer: Self-pay

## 2023-03-16 ENCOUNTER — Other Ambulatory Visit (HOSPITAL_COMMUNITY): Payer: Self-pay

## 2023-03-19 ENCOUNTER — Other Ambulatory Visit (HOSPITAL_COMMUNITY): Payer: Self-pay

## 2023-03-22 ENCOUNTER — Other Ambulatory Visit (HOSPITAL_COMMUNITY): Payer: Self-pay

## 2023-03-23 ENCOUNTER — Encounter: Payer: Self-pay | Admitting: Internal Medicine

## 2023-03-23 ENCOUNTER — Other Ambulatory Visit (HOSPITAL_COMMUNITY): Payer: Self-pay

## 2023-03-23 ENCOUNTER — Ambulatory Visit (INDEPENDENT_AMBULATORY_CARE_PROVIDER_SITE_OTHER): Payer: Commercial Managed Care - PPO | Admitting: Internal Medicine

## 2023-03-23 ENCOUNTER — Other Ambulatory Visit: Payer: Self-pay

## 2023-03-23 VITALS — BP 120/88 | HR 73 | Temp 98.3°F | Ht 67.0 in | Wt 204.0 lb

## 2023-03-23 DIAGNOSIS — G35 Multiple sclerosis: Secondary | ICD-10-CM | POA: Diagnosis not present

## 2023-03-23 DIAGNOSIS — R232 Flushing: Secondary | ICD-10-CM | POA: Diagnosis not present

## 2023-03-23 MED ORDER — ESTRADIOL 0.0375 MG/24HR TD PTWK
0.0375 mg | MEDICATED_PATCH | TRANSDERMAL | 12 refills | Status: DC
  Filled 2023-03-23: qty 4, 28d supply, fill #0
  Filled 2023-04-26: qty 4, 28d supply, fill #1
  Filled 2023-05-24: qty 4, 28d supply, fill #2
  Filled 2023-07-25: qty 4, 28d supply, fill #3
  Filled 2023-08-19: qty 4, 28d supply, fill #4
  Filled 2023-09-25: qty 4, 28d supply, fill #5
  Filled 2024-01-03: qty 4, 28d supply, fill #6

## 2023-03-23 NOTE — Patient Instructions (Signed)
We have sent in the patches to use weekly for the menopause symptoms and most people do this for 6-12 months.

## 2023-03-23 NOTE — Progress Notes (Unsigned)
   Subjective:   Patient ID: Maria Wiley, female    DOB: 01-28-1976, 47 y.o.   MRN: [id number]  HPI The patient is a 47 YO female coming in for bothersome hot flashes. Soaking her at night time. S/P hysterectomy so menopause status unclear.   Review of Systems  Constitutional: Negative.   HENT: Negative.    Eyes: Negative.   Respiratory:  Negative for cough, chest tightness and shortness of breath.   Cardiovascular:  Negative for chest pain, palpitations and leg swelling.  Gastrointestinal:  Negative for abdominal distention, abdominal pain, constipation, diarrhea, nausea and vomiting.  Endocrine: Positive for heat intolerance.  Musculoskeletal: Negative.   Skin: Negative.   Neurological: Negative.   Psychiatric/Behavioral: Negative.      Objective:  Physical Exam Constitutional:      Appearance: She is well-developed.  HENT:     Head: Normocephalic and atraumatic.  Cardiovascular:     Rate and Rhythm: Normal rate and regular rhythm.  Pulmonary:     Effort: Pulmonary effort is normal. No respiratory distress.     Breath sounds: Normal breath sounds. No wheezing or rales.  Abdominal:     General: Bowel sounds are normal. There is no distension.     Palpations: Abdomen is soft.     Tenderness: There is no abdominal tenderness. There is no rebound.  Musculoskeletal:     Cervical back: Normal range of motion.  Skin:    General: Skin is warm and dry.  Neurological:     Mental Status: She is alert and oriented to person, place, and time.     Coordination: Coordination normal.     Vitals:   03/23/23 1554  BP: 120/88  Pulse: 73  Temp: 98.3 F (36.8 C)  TempSrc: Oral  SpO2: 98%  Weight: 204 lb (92.5 kg)  Height: 5\' 7"  (1.702 m)    Assessment & Plan:

## 2023-03-24 DIAGNOSIS — R232 Flushing: Secondary | ICD-10-CM | POA: Insufficient documentation

## 2023-03-24 NOTE — Assessment & Plan Note (Signed)
She is already on lexapro 20 mg daily previously and having breakthrough hot flashes. We discussed hormonal treatment. She does not need progesterone s/p hysterectomy. Rx estrogen transdermal. She will let us know about 1 month in if dosing is appropriate and can adjust as needed. Likely will do 6-12 months of treatment and then consider stopping.

## 2023-03-29 ENCOUNTER — Other Ambulatory Visit: Payer: Self-pay

## 2023-04-08 DIAGNOSIS — M0609 Rheumatoid arthritis without rheumatoid factor, multiple sites: Secondary | ICD-10-CM | POA: Diagnosis not present

## 2023-04-08 DIAGNOSIS — M450 Ankylosing spondylitis of multiple sites in spine: Secondary | ICD-10-CM | POA: Diagnosis not present

## 2023-04-08 DIAGNOSIS — Z6831 Body mass index (BMI) 31.0-31.9, adult: Secondary | ICD-10-CM | POA: Diagnosis not present

## 2023-04-08 DIAGNOSIS — E669 Obesity, unspecified: Secondary | ICD-10-CM | POA: Diagnosis not present

## 2023-04-08 DIAGNOSIS — Z79899 Other long term (current) drug therapy: Secondary | ICD-10-CM | POA: Diagnosis not present

## 2023-04-08 DIAGNOSIS — R5383 Other fatigue: Secondary | ICD-10-CM | POA: Diagnosis not present

## 2023-04-13 ENCOUNTER — Other Ambulatory Visit (HOSPITAL_COMMUNITY): Payer: Self-pay

## 2023-04-14 LAB — HELIX MOLECULAR SCREEN

## 2023-04-15 ENCOUNTER — Other Ambulatory Visit (HOSPITAL_COMMUNITY): Payer: Self-pay

## 2023-04-20 ENCOUNTER — Other Ambulatory Visit: Payer: Self-pay

## 2023-04-20 ENCOUNTER — Other Ambulatory Visit (HOSPITAL_COMMUNITY): Payer: Self-pay

## 2023-04-24 ENCOUNTER — Encounter (HOSPITAL_COMMUNITY): Payer: Self-pay

## 2023-04-26 ENCOUNTER — Other Ambulatory Visit (HOSPITAL_COMMUNITY): Payer: Self-pay

## 2023-04-26 ENCOUNTER — Other Ambulatory Visit: Payer: Self-pay

## 2023-04-26 MED ORDER — AMANTADINE HCL 100 MG PO CAPS
100.0000 mg | ORAL_CAPSULE | Freq: Two times a day (BID) | ORAL | 5 refills | Status: DC
Start: 2023-04-26 — End: 2023-08-26
  Filled 2023-04-26: qty 30, 15d supply, fill #0
  Filled 2023-05-11: qty 30, 15d supply, fill #1
  Filled 2023-05-24: qty 30, 15d supply, fill #2
  Filled 2023-06-11: qty 30, 15d supply, fill #3
  Filled 2023-07-25: qty 30, 15d supply, fill #4
  Filled 2023-08-06: qty 30, 15d supply, fill #5

## 2023-05-06 ENCOUNTER — Ambulatory Visit: Payer: Commercial Managed Care - PPO | Admitting: Nurse Practitioner

## 2023-05-06 VITALS — BP 124/80 | HR 73 | Temp 97.6°F | Ht 67.0 in | Wt 207.1 lb

## 2023-05-06 DIAGNOSIS — R221 Localized swelling, mass and lump, neck: Secondary | ICD-10-CM | POA: Diagnosis not present

## 2023-05-06 DIAGNOSIS — R1310 Dysphagia, unspecified: Secondary | ICD-10-CM | POA: Diagnosis not present

## 2023-05-06 DIAGNOSIS — E041 Nontoxic single thyroid nodule: Secondary | ICD-10-CM

## 2023-05-06 LAB — BASIC METABOLIC PANEL
BUN: 16 mg/dL (ref 6–23)
CO2: 31 meq/L (ref 19–32)
Calcium: 9.6 mg/dL (ref 8.4–10.5)
Chloride: 101 meq/L (ref 96–112)
Creatinine, Ser: 0.83 mg/dL (ref 0.40–1.20)
GFR: 83.24 mL/min (ref >60.00)
Glucose, Bld: 93 mg/dL (ref 70–99)
Potassium: 3.8 meq/L (ref 3.5–5.1)
Sodium: 137 meq/L (ref 135–145)

## 2023-05-06 LAB — T3, FREE: T3, Free: 3.2 pg/mL (ref 2.3–4.2)

## 2023-05-06 LAB — T4, FREE: Free T4: 0.95 ng/dL (ref 0.60–1.60)

## 2023-05-06 LAB — TSH: TSH: 1.74 u[IU]/mL (ref 0.35–5.50)

## 2023-05-06 NOTE — Progress Notes (Signed)
Established Patient Office Visit  Subjective   Patient ID: Maria Wiley, female    DOB: [birth year]  Age: 47 y.o. MRN: [id number]  Chief Complaint  Patient presents with   Throat issue    Feels there is something in the passage way when swallowing, nodules around the throat area, more one the right side and one being sore     Patient arrives for acute visit for the above. She has PMH significant for hypertension, migraine, allergic rhinitis, IBS, Hashimoto's/hypothyroidism, MS, rheumatoid arthritis, ankylosing spondylitis, insomnia, B12 deficiency.  Symptom onset about 1 month ago. Also reports having had covid infection around that time. Covid has not resolved. Has noticed some dysphagia/sensation of feeling like food, water, medications get stuck in throat when swallowing but will eventually pass down esophagus. Reports coughing at times with swallowing. Has also noticed masses/nodules on her neck. Endorses fatigue, insomnia, chronic constipation, chronic/unchanged intermittent cardiac palpitations.    Review of Systems  Constitutional:  Positive for malaise/fatigue. Negative for fever (had covid 3.5 weeks ago) and weight loss.  Cardiovascular:  Positive for palpitations (in the past; not in a while). Negative for chest pain.  Gastrointestinal:  Positive for constipation. Negative for abdominal pain, blood in stool and diarrhea.       (+) dysphagia  Psychiatric/Behavioral:  The patient has insomnia.       Objective:     BP 124/80   Pulse 73   Temp 97.6 F (36.4 C) (Temporal)   Ht 5\' 7"  (1.702 m)   Wt 207 lb 2 oz (94 kg)   SpO2 98%   BMI 32.44 kg/m  BP Readings from Last 3 Encounters:  05/06/23 124/80  03/23/23 120/88  01/22/23 118/89   Wt Readings from Last 3 Encounters:  05/06/23 207 lb 2 oz (94 kg)  03/23/23 204 lb (92.5 kg)  09/30/22 188 lb (85.3 kg)      Physical Exam Vitals reviewed.  Constitutional:      General: She is not in acute distress.     Appearance: Normal appearance.  HENT:     Head: Normocephalic and atraumatic.  Neck:     Thyroid: Thyroid mass and thyroid tenderness present. No thyromegaly.     Vascular: No carotid bruit.  Cardiovascular:     Rate and Rhythm: Normal rate and regular rhythm.     Pulses: Normal pulses.     Heart sounds: Normal heart sounds.  Pulmonary:     Effort: Pulmonary effort is normal.     Breath sounds: Normal breath sounds.  Skin:    General: Skin is warm and dry.  Neurological:     General: No focal deficit present.     Mental Status: She is alert and oriented to person, place, and time.  Psychiatric:        Mood and Affect: Mood normal.        Behavior: Behavior normal.        Judgment: Judgment normal.      No results found for any visits on 05/06/23.    The 10-year ASCVD risk score (Arnett DK, et al., 2019) is: 4.6%    Assessment & Plan:   Problem List Items Addressed This Visit       Digestive   Dysphagia    Acute Unclear if this is related to MS vs. Neck mass vs. Other etiology. Will refer to GI for assistance with evaluation. Patient encouraged to chew food thoroughly, take small bites while awaiting work-up.  Relevant Orders   Ambulatory referral to Gastroenterology     Other   Neck mass - Primary    Acute Check thyroid panel Check u/s of thyroid. Not sure if this is reactive lymphadenopathy r/t recent covid infection vs thyroid nodules/masses. Further recommendations may be made based on lab and imaging results.       *patient declines flu shot today, but reports that she plans on having this administered at work.   Return in about 6 weeks (around 06/17/2023) for F/u with PCP.    Elenore Paddy, NP

## 2023-05-06 NOTE — Assessment & Plan Note (Signed)
Acute Check thyroid panel Check u/s of thyroid. Not sure if this is reactive lymphadenopathy r/t recent covid infection vs thyroid nodules/masses. Further recommendations may be made based on lab and imaging results.

## 2023-05-06 NOTE — Assessment & Plan Note (Addendum)
Acute Unclear if this is related to MS vs. Neck mass vs. Other etiology. Will refer to GI for assistance with evaluation. Patient encouraged to chew food thoroughly, take small bites while awaiting work-up.

## 2023-05-07 ENCOUNTER — Encounter: Payer: Self-pay | Admitting: Gastroenterology

## 2023-05-10 ENCOUNTER — Ambulatory Visit (INDEPENDENT_AMBULATORY_CARE_PROVIDER_SITE_OTHER): Payer: Commercial Managed Care - PPO | Admitting: Gastroenterology

## 2023-05-10 ENCOUNTER — Encounter: Payer: Self-pay | Admitting: Acute Care

## 2023-05-10 ENCOUNTER — Encounter: Payer: Self-pay | Admitting: Gastroenterology

## 2023-05-10 VITALS — BP 104/64 | HR 84 | Ht 67.0 in | Wt 210.0 lb

## 2023-05-10 DIAGNOSIS — G35 Multiple sclerosis: Secondary | ICD-10-CM

## 2023-05-10 DIAGNOSIS — R1310 Dysphagia, unspecified: Secondary | ICD-10-CM | POA: Diagnosis not present

## 2023-05-10 DIAGNOSIS — K5909 Other constipation: Secondary | ICD-10-CM | POA: Diagnosis not present

## 2023-05-10 DIAGNOSIS — K581 Irritable bowel syndrome with constipation: Secondary | ICD-10-CM | POA: Diagnosis not present

## 2023-05-10 DIAGNOSIS — G35D Multiple sclerosis, unspecified: Secondary | ICD-10-CM

## 2023-05-10 NOTE — Progress Notes (Signed)
Chief Complaint: Dysphagia Primary GI MD: Gentry Fitz  HPI: 47 year old female history of hypertension, IBS, Hashimoto's/hypothyroidism, MS, rheumatoid arthritis, ankylosing spondylitis, presents for evaluation of dysphagia.  Referred here by PCP.  Has had dysphagia for 1 month.  Reports having COVID at that time.  She states her dysphagia is mainly to pills.  Sometimes if she does not pay attention to chewing properly she will have dysphagia to solids.  Occasionally when she swallows liquids she will choke/cough as well.  Denies GERD/heartburn  She reports waking up in the morning feeling like something is in her throat often.  She noted around the time of her dysphagia to have thyroid nodules.  She is scheduled for an ultrasound tomorrow to evaluate these.  She also reports chronic constipation.  States she may have 1 bowel movement per week and it is typically hard and involve straining.  She is not currently on anything for her constipation.  She has abdominal pain associated with her constipation.  Denies melena/hematochezia.  Denies NSAID use.  No previous EGD.  Reports colonoscopy in 2016/2017 in [place] which was normal with a 10-year recall.    Past Medical History:  Diagnosis Date   Ankylosing spondylitis (HCC)    Chest pain    COVID-19 06/2020   GERD (gastroesophageal reflux disease)    Hypertension    Migraines    Multiple sclerosis (HCC)    RA (rheumatoid arthritis) (HCC)    Thyroid disease    Urinary incontinence     Past Surgical History:  Procedure Laterality Date   ABDOMINAL HYSTERECTOMY     APPENDECTOMY     CHOLECYSTECTOMY     HERNIA REPAIR  2004   KNEE SURGERY Right 2006    Current Outpatient Medications  Medication Sig Dispense Refill   amantadine (SYMMETREL) 100 MG capsule Take 1 capsule (100 mg total) by mouth 2 (two) times daily. 30 capsule 5   amLODipine (NORVASC) 5 MG tablet Take 1 tablet (5 mg total) by mouth daily. 90 tablet 1   aspirin EC 81  MG tablet Take 1 tablet (81 mg total) by mouth daily. Swallow whole. 30 tablet 12   Cholecalciferol (VITAMIN D3) 1000 units CAPS Take 1 tablet (1,000 Units) by mouth daily. 90 capsule 0   cyanocobalamin (VITAMIN B12) 1000 MCG tablet Take 1 tablet (1,000 mcg total) by mouth daily. 90 tablet 3   cyclobenzaprine (FLEXERIL) 5 MG tablet Take 1 tablet (5 mg total) by mouth at bedtime as needed for muscle spasms. 30 tablet 0   diclofenac (VOLTAREN) 75 MG EC tablet Take 1 tablet (75 mg total) by mouth 2 (two) times daily for joint or back pain. 60 tablet 2   escitalopram (LEXAPRO) 20 MG tablet Take 1 tablet (20 mg total) by mouth daily. 90 tablet 3   estradiol (CLIMARA - DOSED IN MG/24 HR) 0.0375 mg/24hr patch Place 1 patch (0.0375 mg total) onto the skin once a week. 4 patch 12   folic acid (FOLVITE) 1 MG tablet Take 1 tablet (1 mg total) by mouth daily. 90 tablet 3   gabapentin (NEURONTIN) 100 MG capsule Take 1 capsule (100 mg total) by mouth 2 (two) times daily as needed. 60 capsule 6   gabapentin (NEURONTIN) 300 MG capsule Take 2 capsules (600 mg total) by mouth at bedtime. 60 capsule 6   levothyroxine (SYNTHROID) 25 MCG tablet Take 1 tablet (25 mcg total) by mouth daily before breakfast. 90 tablet 3   linaclotide (LINZESS) 145 MCG CAPS capsule Take  by mouth.     Magnesium 250 MG TABS Take 1 tablet (250 mg total) by mouth daily. 90 tablet 0   Ofatumumab (KESIMPTA) 20 MG/0.4ML SOAJ Inject 0.4 mLs (20 mg total) into the skin every 28 days. 0.4 mL 11   solifenacin (VESICARE) 10 MG tablet Take 1 tablet (10 mg total) by mouth daily. 90 tablet 0   sulfaSALAzine (AZULFIDINE) 500 MG tablet Take 2 tablets (1,000 mg total) by mouth 2 (two) times daily. 120 tablet 3   traZODone (DESYREL) 50 MG tablet Take 2 tablets (100 mg total) by mouth at bedtime. 180 tablet 3   valACYclovir (VALTREX) 1000 MG tablet Take 1 tablet (1,000 mg total) by mouth 2 (two) times daily. 60 tablet 3   No current facility-administered  medications for this visit.    Allergies as of 05/10/2023   (No Known Allergies)    Family History  Problem Relation Age of Onset   Hypertension Mother    Arthritis Mother    Irritable bowel syndrome Mother    Arthritis Maternal Grandmother    Heart disease Maternal Grandmother    Hyperlipidemia Maternal Grandmother    CAD Maternal Grandfather    Diabetes Maternal Grandfather    Hearing loss Maternal Grandfather    Heart disease Maternal Grandfather    Hyperlipidemia Maternal Grandfather    Migraines Neg Hx    Headache Neg Hx    Breast cancer Neg Hx    Colon cancer Neg Hx    Esophageal cancer Neg Hx     Social History   Socioeconomic History   Marital status: Married    Spouse name: Not on file   Number of children: 4   Years of education: Not on file   Highest education level: Bachelor's degree (e.g., BA, AB, BS)  Occupational History   Not on file  Tobacco Use   Smoking status: Never   Smokeless tobacco: Never  Vaping Use   Vaping status: Never Used  Substance and Sexual Activity   Alcohol use: Never   Drug use: Never   Sexual activity: Yes    Birth control/protection: Surgical  Other Topics Concern   Not on file  Social History Narrative   Lives at home with her husband & her 4 sons   Right handed   Caffeine: 2-3 cups per day   Social Determinants of Health   Financial Resource Strain: Not on file  Food Insecurity: Not on file  Transportation Needs: Not on file  Physical Activity: Not on file  Stress: Not on file  Social Connections: Not on file  Intimate Partner Violence: Not on file    Review of Systems:    Constitutional: No weight loss, fever, chills, weakness or fatigue HEENT: Eyes: No change in vision               Ears, Nose, Throat:  No change in hearing or congestion Skin: No rash or itching Cardiovascular: No chest pain, chest pressure or palpitations   Respiratory: No SOB or cough Gastrointestinal: See HPI and otherwise  negative Genitourinary: No dysuria or change in urinary frequency Neurological: No headache, dizziness or syncope Musculoskeletal: No new muscle or joint pain Hematologic: No bleeding or bruising Psychiatric: No history of depression or anxiety    Physical Exam:  Vital signs: BP 104/64   Pulse 84   Ht 5\' 7"  (1.702 m)   Wt 95.3 kg   BMI 32.89 kg/m   Constitutional: NAD, Well developed, Well nourished, alert and cooperative  Head:  Normocephalic and atraumatic. Eyes:   PEERL, EOMI. No icterus. Conjunctiva pink. Respiratory: Respirations even and unlabored. Lungs clear to auscultation bilaterally.   No wheezes, crackles, or rhonchi.  Cardiovascular:  Regular rate and rhythm. No peripheral edema, cyanosis or pallor.  Gastrointestinal:  Soft, nondistended, mild generalized discomfort no rebound or guarding. Normal bowel sounds. No appreciable masses or hepatomegaly. Rectal:  Not performed.  Msk:  Symmetrical without gross deformities. Without edema, no deformity or joint abnormality.  Neurologic:  Alert and  oriented x4;  grossly normal neurologically.  Skin:   Dry and intact without significant lesions or rashes. Psychiatric: Oriented to person, place and time. Demonstrates good judgement and reason without abnormal affect or behaviors.   RELEVANT LABS AND IMAGING: CBC    Component Value Date/Time   WBC 5.2 06/09/2022 1130   RBC 4.48 06/09/2022 1130   HGB 13.3 06/09/2022 1148   HCT 39.0 06/09/2022 1148   PLT 255 06/09/2022 1130   MCV 83.5 06/09/2022 1130   MCH 25.9 (L) 06/09/2022 1130   MCHC 31.0 06/09/2022 1130   RDW 13.4 06/09/2022 1130   LYMPHSABS 2.0 06/09/2022 1130   MONOABS 0.4 06/09/2022 1130   EOSABS 0.1 06/09/2022 1130   BASOSABS 0.1 06/09/2022 1130    CMP     Component Value Date/Time   NA 137 05/06/2023 1610   K 3.8 05/06/2023 1610   CL 101 05/06/2023 1610   CO2 31 05/06/2023 1610   GLUCOSE 93 05/06/2023 1610   BUN 16 05/06/2023 1610   CREATININE 0.83  05/06/2023 1610   CALCIUM 9.6 05/06/2023 1610   PROT 7.2 06/09/2022 1130   ALBUMIN 3.9 06/09/2022 1130   ALBUMIN 3.9 12/17/2021 0949   AST 15 06/09/2022 1130   ALT <5 06/09/2022 1130   ALKPHOS 48 06/09/2022 1130   BILITOT 0.4 06/09/2022 1130   GFRNONAA >60 06/09/2022 1130   GFRAA >60 11/12/2018 1625     Assessment/Plan:   Dysphagia Mainly pill dysphagia.  Possible globus sensation in the mornings.  Denies GERD/heartburn.  Also sounds like she may have some aspiration with liquids occasionally.  Sometimes solid food dysphagia but not often.  No previous EGD.  History of MS. - Barium swallow with tablet to evaluate for Zenker's diverticulum with pill dysphagia.  This can also assess for stricture/motility. - Pending barium swallow results may consider EGD for further evaluation - Follow thyroid ultrasound  Chronic constipation 1 bowel movement per week, typically straining.  Associated abdominal pain and bloating. - Recommend MiraLAX 1 capful per week and adjust dose based on response.  She will send me a MyChart message if worsening symptoms. - Follow-up 3 months  CRC screening Reportedly had colonoscopy 2016/2017 with 10-year recall done in [place], [place]  Gastroenterology 05/10/2023, 2:25 PM  Cc: Elenore Paddy, NP

## 2023-05-10 NOTE — Patient Instructions (Addendum)
Start taking Miralax 1 capful (17 grams) 1x / day for 1 week.   If this is not effective, increase to 1 dose 2x / day for 1 week.   If this is still not effective, increase to two capfuls (34 grams) 2x / day.   Can adjust dose as needed based on response. Can take 1/2 cap daily, skip days, or increase per day.    You have been scheduled for a Barium Esophogram at [facility] (1st floor of the hospital) on 05/18/23 at 10:00 am. Please arrive 30 minutes prior to your appointment for registration. Make certain not to have anything to eat or drink 3 hours prior to your test. If you need to reschedule for any reason, please contact radiology at [phone] to do so.  __________________________________________________________ A barium swallow is an examination that concentrates on views of the esophagus. This tends to be a double contrast exam (barium and two liquids which, when combined, create a gas to distend the wall of the oesophagus) or single contrast (non-ionic iodine based). The study is usually tailored to your symptoms so a good history is essential. Attention is paid during the study to the form, structure and configuration of the esophagus, looking for functional disorders (such as aspiration, dysphagia, achalasia, motility and reflux) EXAMINATION You may be asked to change into a gown, depending on the type of swallow being performed. A radiologist and radiographer will perform the procedure. The radiologist will advise you of the type of contrast selected for your procedure and direct you during the exam. You will be asked to stand, sit or lie in several different positions and to hold a small amount of fluid in your mouth before being asked to swallow while the imaging is performed .In some instances you may be asked to swallow barium coated marshmallows to assess the motility of a solid food bolus. The exam can be recorded as a digital or video fluoroscopy procedure. POST  PROCEDURE It will take 1-2 days for the barium to pass through your system. To facilitate this, it is important, unless otherwise directed, to increase your fluids for the next 24-48hrs and to resume your normal diet.  This test typically takes about 30 minutes to perform. __________________________________________________________  Due to recent changes in healthcare laws, you may see the results of your imaging and laboratory studies on MyChart before your provider has had a chance to review them.  We understand that in some cases there may be results that are confusing or concerning to you. Not all laboratory results come back in the same time frame and the provider may be waiting for multiple results in order to interpret others.  Please give [place] 48 hours in order for your provider to thoroughly review all the results before contacting the office for clarification of your results.   _______________________________________________________  If your blood pressure at your visit was 140/90 or greater, please contact your primary care physician to follow up on this.  _______________________________________________________  If you are age 28 or older, your body mass index should be between 23-30. Your Body mass index is 32.89 kg/m. If this is out of the aforementioned range listed, please consider follow up with your Primary Care Provider.  If you are age 66 or younger, your body mass index should be between 19-25. Your Body mass index is 32.89 kg/m. If this is out of the aformentioned range listed, please consider follow up with your Primary Care Provider.   ________________________________________________________  The  [facility] providers would like to encourage you to use [facility] to communicate with providers for non-urgent requests or questions.  Due to long hold times on the telephone, sending your provider a message by [facility] may be a faster and more efficient way to get a response.  Please  allow 48 business hours for a response.  Please remember that this is for non-urgent requests.  _______________________________________________________ Thank you for choosing me and [facility].  Boone Master, PA-C

## 2023-05-11 ENCOUNTER — Other Ambulatory Visit (HOSPITAL_COMMUNITY): Payer: Self-pay

## 2023-05-11 ENCOUNTER — Ambulatory Visit
Admission: RE | Admit: 2023-05-11 | Discharge: 2023-05-11 | Disposition: A | Discharge status: Home / Self Care | Payer: Commercial Managed Care - PPO | Source: Ambulatory Visit | Attending: Nurse Practitioner | Admitting: Nurse Practitioner

## 2023-05-11 ENCOUNTER — Encounter: Payer: Self-pay | Admitting: Acute Care

## 2023-05-11 ENCOUNTER — Other Ambulatory Visit: Payer: Self-pay | Admitting: Internal Medicine

## 2023-05-11 DIAGNOSIS — E041 Nontoxic single thyroid nodule: Secondary | ICD-10-CM

## 2023-05-11 DIAGNOSIS — R221 Localized swelling, mass and lump, neck: Secondary | ICD-10-CM | POA: Diagnosis not present

## 2023-05-12 ENCOUNTER — Other Ambulatory Visit (HOSPITAL_COMMUNITY): Payer: Self-pay

## 2023-05-12 ENCOUNTER — Other Ambulatory Visit: Payer: Self-pay

## 2023-05-12 MED ORDER — AMLODIPINE BESYLATE 5 MG PO TABS
5.0000 mg | ORAL_TABLET | Freq: Every day | ORAL | 1 refills | Status: DC
  Filled 2023-05-12: qty 90, 90d supply, fill #0
  Filled 2023-08-11: qty 90, 90d supply, fill #1

## 2023-05-12 MED ORDER — SOLIFENACIN SUCCINATE 10 MG PO TABS
10.0000 mg | ORAL_TABLET | Freq: Every day | ORAL | 0 refills | Status: DC
  Filled 2023-05-12: qty 90, 90d supply, fill #0

## 2023-05-12 NOTE — Progress Notes (Signed)
I agree with the assessment and plan as outlined by Ms. McMichael. Could also consider oropharyngeal dysphagia with her history of MS and coughing when swallowing. Thus could also consider referral to speech therapy pending barium swallow results.

## 2023-05-14 ENCOUNTER — Other Ambulatory Visit (HOSPITAL_COMMUNITY): Payer: Self-pay

## 2023-05-18 ENCOUNTER — Other Ambulatory Visit: Payer: Self-pay

## 2023-05-18 ENCOUNTER — Ambulatory Visit (HOSPITAL_COMMUNITY)
Admission: RE | Admit: 2023-05-18 | Discharge: 2023-05-18 | Disposition: A | Discharge status: Home / Self Care | Payer: Commercial Managed Care - PPO | Source: Ambulatory Visit | Attending: Gastroenterology | Admitting: Gastroenterology

## 2023-05-18 DIAGNOSIS — R1310 Dysphagia, unspecified: Secondary | ICD-10-CM | POA: Diagnosis not present

## 2023-05-18 DIAGNOSIS — K224 Dyskinesia of esophagus: Secondary | ICD-10-CM | POA: Diagnosis not present

## 2023-05-18 NOTE — Progress Notes (Signed)
Specialty Pharmacy Refill Coordination Note  Maria Wiley is a 47 y.o. female contacted today regarding refills of specialty medication(s) Ofatumumab (Multiple Sclerosis Agents)   Patient requested Delivery   Delivery date: 05/20/23   Verified address: [address]   [place] [address]   Medication will be filled on 05/19/23.

## 2023-05-19 ENCOUNTER — Other Ambulatory Visit: Payer: Self-pay

## 2023-05-24 ENCOUNTER — Other Ambulatory Visit (HOSPITAL_COMMUNITY): Payer: Self-pay

## 2023-05-24 ENCOUNTER — Other Ambulatory Visit: Payer: Self-pay | Admitting: Internal Medicine

## 2023-05-24 ENCOUNTER — Other Ambulatory Visit: Payer: Self-pay

## 2023-05-24 MED ORDER — ESCITALOPRAM OXALATE 20 MG PO TABS
20.0000 mg | ORAL_TABLET | Freq: Every day | ORAL | 0 refills | Status: DC
  Filled 2023-05-24: qty 90, 90d supply, fill #0

## 2023-05-24 MED ORDER — SULFASALAZINE 500 MG PO TABS
1000.0000 mg | ORAL_TABLET | Freq: Two times a day (BID) | ORAL | 1 refills | Status: DC
  Filled 2023-05-24: qty 120, 30d supply, fill #0
  Filled 2023-07-25: qty 120, 30d supply, fill #1

## 2023-05-25 ENCOUNTER — Ambulatory Visit (INDEPENDENT_AMBULATORY_CARE_PROVIDER_SITE_OTHER): Payer: Commercial Managed Care - PPO | Admitting: Internal Medicine

## 2023-05-25 ENCOUNTER — Other Ambulatory Visit (HOSPITAL_COMMUNITY): Payer: Self-pay

## 2023-05-25 VITALS — BP 124/84 | HR 73 | Temp 98.7°F | Ht 67.0 in | Wt 209.0 lb

## 2023-05-25 DIAGNOSIS — R221 Localized swelling, mass and lump, neck: Secondary | ICD-10-CM

## 2023-05-25 MED ORDER — GABAPENTIN 300 MG PO CAPS
600.0000 mg | ORAL_CAPSULE | Freq: Every evening | ORAL | 6 refills | Status: DC
Start: 2023-05-24 — End: 2023-12-20
  Filled 2023-05-25: qty 60, 30d supply, fill #0
  Filled 2023-06-23: qty 60, 30d supply, fill #1
  Filled 2023-07-25: qty 60, 30d supply, fill #2
  Filled 2023-08-21: qty 60, 30d supply, fill #3
  Filled 2023-09-25: qty 60, 30d supply, fill #4
  Filled 2023-10-23: qty 60, 30d supply, fill #5
  Filled 2023-11-23: qty 60, 30d supply, fill #6

## 2023-05-26 ENCOUNTER — Other Ambulatory Visit (HOSPITAL_COMMUNITY): Payer: Self-pay

## 2023-05-28 ENCOUNTER — Encounter: Payer: Self-pay | Admitting: Internal Medicine

## 2023-05-28 ENCOUNTER — Other Ambulatory Visit (HOSPITAL_COMMUNITY): Payer: Self-pay

## 2023-05-28 MED ORDER — DICLOFENAC SODIUM 75 MG PO TBEC
75.0000 mg | DELAYED_RELEASE_TABLET | Freq: Two times a day (BID) | ORAL | 4 refills | Status: DC
  Filled 2023-05-28: qty 60, 30d supply, fill #0
  Filled 2023-06-23: qty 60, 30d supply, fill #1
  Filled 2023-07-25: qty 60, 30d supply, fill #2
  Filled 2023-08-31: qty 60, 30d supply, fill #3
  Filled 2023-10-06: qty 60, 30d supply, fill #4

## 2023-05-28 NOTE — Assessment & Plan Note (Signed)
Reviewed recent US results and prior CT angio head and neck results. There are no concerning findings. No further imaging needed except for clinical changes.

## 2023-05-28 NOTE — Progress Notes (Signed)
Subjective:   Patient ID: Maria Wiley, female    DOB: 1975-09-11, 47 y.o.   MRN: [id number]  Thyroid Problem Patient reports no constipation, diarrhea or palpitations.   The patient is a 47 YO female coming in to discuss recent imaging results of thyroid and prior imaging neck.  Review of Systems  Constitutional: Negative.   HENT: Negative.    Eyes: Negative.   Respiratory:  Negative for cough, chest tightness and shortness of breath.   Cardiovascular:  Negative for chest pain, palpitations and leg swelling.  Gastrointestinal:  Negative for abdominal distention, abdominal pain, constipation, diarrhea, nausea and vomiting.  Musculoskeletal: Negative.   Skin: Negative.   Neurological: Negative.   Psychiatric/Behavioral: Negative.      Objective:  Physical Exam Constitutional:      Appearance: She is well-developed.  HENT:     Head: Normocephalic and atraumatic.  Cardiovascular:     Rate and Rhythm: Normal rate and regular rhythm.  Pulmonary:     Effort: Pulmonary effort is normal. No respiratory distress.     Breath sounds: Normal breath sounds. No wheezing or rales.  Abdominal:     General: Bowel sounds are normal. There is no distension.     Palpations: Abdomen is soft.     Tenderness: There is no abdominal tenderness. There is no rebound.  Musculoskeletal:     Cervical back: Normal range of motion.  Skin:    General: Skin is warm and dry.  Neurological:     Mental Status: She is alert and oriented to person, place, and time.     Coordination: Coordination normal.     Vitals:   05/25/23 0953  BP: 124/84  Pulse: 73  Temp: 98.7 F (37.1 C)  TempSrc: Oral  SpO2: 97%  Weight: 209 lb (94.8 kg)  Height: 5\' 7"  (1.702 m)    Assessment & Plan:  Visit time 15 minutes in face to face communication with patient and coordination of care, additional 5 minutes spent in record review, coordination or care, ordering tests, communicating/referring to other healthcare  professionals, documenting in medical records all on the same day of the visit for total time 20 minutes spent on the visit.

## 2023-06-04 ENCOUNTER — Other Ambulatory Visit: Payer: Self-pay

## 2023-06-04 ENCOUNTER — Encounter (HOSPITAL_COMMUNITY): Payer: Self-pay

## 2023-06-04 NOTE — Progress Notes (Signed)
Specialty Pharmacy Ongoing Clinical Assessment Note  Maria Wiley is a 47 y.o. female who is being followed by the specialty pharmacy service for RxSp Multiple Sclerosis   Patient's specialty medication(s) reviewed today: Ofatumumab (Multiple Sclerosis Agents)   Missed doses in the last 4 weeks: 0   Patient/Caregiver did not have any additional questions or concerns.   Therapeutic benefit summary: Patient is achieving benefit   Adverse events/side effects summary: No adverse events/side effects   Patient's therapy is appropriate to: Continue    Goals Addressed             This Visit's Progress    Slow Disease Progression       Patient is on track. Patient will maintain adherence         Follow up:  6 months  Otto Herb Specialty Pharmacist

## 2023-06-04 NOTE — Progress Notes (Signed)
Specialty Pharmacy Refill Coordination Note  Maria Wiley is a 47 y.o. female contacted today regarding refills of specialty medication(s) Ofatumumab (Multiple Sclerosis Agents)   Patient requested Delivery   Delivery date: 06/23/23   Verified address: [address]   [place] [address]   Medication will be filled on 06/22/23.

## 2023-06-18 ENCOUNTER — Ambulatory Visit: Payer: Commercial Managed Care - PPO | Admitting: Internal Medicine

## 2023-06-22 ENCOUNTER — Other Ambulatory Visit: Payer: Self-pay

## 2023-06-23 ENCOUNTER — Other Ambulatory Visit (HOSPITAL_COMMUNITY): Payer: Self-pay

## 2023-07-06 DIAGNOSIS — G35 Multiple sclerosis: Secondary | ICD-10-CM | POA: Diagnosis not present

## 2023-07-08 DIAGNOSIS — R5383 Other fatigue: Secondary | ICD-10-CM | POA: Diagnosis not present

## 2023-07-08 DIAGNOSIS — M450 Ankylosing spondylitis of multiple sites in spine: Secondary | ICD-10-CM | POA: Diagnosis not present

## 2023-07-08 DIAGNOSIS — Z79899 Other long term (current) drug therapy: Secondary | ICD-10-CM | POA: Diagnosis not present

## 2023-07-08 DIAGNOSIS — E669 Obesity, unspecified: Secondary | ICD-10-CM | POA: Diagnosis not present

## 2023-07-08 DIAGNOSIS — Z6833 Body mass index (BMI) 33.0-33.9, adult: Secondary | ICD-10-CM | POA: Diagnosis not present

## 2023-07-08 DIAGNOSIS — M0609 Rheumatoid arthritis without rheumatoid factor, multiple sites: Secondary | ICD-10-CM | POA: Diagnosis not present

## 2023-07-12 ENCOUNTER — Other Ambulatory Visit: Payer: Self-pay

## 2023-07-19 ENCOUNTER — Other Ambulatory Visit: Payer: Self-pay

## 2023-07-19 NOTE — Progress Notes (Signed)
Specialty Pharmacy Refill Coordination Note  Maria Wiley is a 47 y.o. female contacted today regarding refills of specialty medication(s) Ofatumumab [name])   Patient requested Delivery   Delivery date: 07/23/23   Verified address: [address]   [place] [address]   Medication will be filled on 07/22/23.

## 2023-07-22 ENCOUNTER — Other Ambulatory Visit: Payer: Self-pay

## 2023-08-05 ENCOUNTER — Other Ambulatory Visit: Payer: Self-pay | Admitting: Internal Medicine

## 2023-08-05 DIAGNOSIS — Z1231 Encounter for screening mammogram for malignant neoplasm of breast: Secondary | ICD-10-CM

## 2023-08-06 ENCOUNTER — Other Ambulatory Visit (HOSPITAL_COMMUNITY): Payer: Self-pay

## 2023-08-09 ENCOUNTER — Other Ambulatory Visit (HOSPITAL_COMMUNITY): Payer: Self-pay

## 2023-08-09 ENCOUNTER — Encounter: Payer: Self-pay | Admitting: Acute Care

## 2023-08-10 ENCOUNTER — Encounter: Payer: Self-pay | Admitting: Acute Care

## 2023-08-10 ENCOUNTER — Other Ambulatory Visit: Payer: Self-pay

## 2023-08-10 ENCOUNTER — Other Ambulatory Visit (HOSPITAL_COMMUNITY): Payer: Self-pay

## 2023-08-11 ENCOUNTER — Other Ambulatory Visit: Payer: Self-pay | Admitting: Internal Medicine

## 2023-08-11 ENCOUNTER — Other Ambulatory Visit (HOSPITAL_COMMUNITY): Payer: Self-pay

## 2023-08-11 MED ORDER — SOLIFENACIN SUCCINATE 10 MG PO TABS
10.0000 mg | ORAL_TABLET | Freq: Every day | ORAL | 0 refills | Status: DC
  Filled 2023-08-11: qty 90, 90d supply, fill #0

## 2023-08-12 ENCOUNTER — Encounter (HOSPITAL_COMMUNITY): Payer: Self-pay | Admitting: Emergency Medicine

## 2023-08-12 ENCOUNTER — Other Ambulatory Visit: Payer: Self-pay

## 2023-08-12 ENCOUNTER — Ambulatory Visit (HOSPITAL_COMMUNITY)
Admission: EM | Admit: 2023-08-12 | Discharge: 2023-08-12 | Disposition: A | Discharge status: Home / Self Care | Payer: Commercial Managed Care - PPO | Attending: Internal Medicine | Admitting: Internal Medicine

## 2023-08-12 ENCOUNTER — Ambulatory Visit: Payer: Commercial Managed Care - PPO

## 2023-08-12 DIAGNOSIS — H6993 Unspecified Eustachian tube disorder, bilateral: Secondary | ICD-10-CM | POA: Diagnosis not present

## 2023-08-12 NOTE — ED Provider Notes (Signed)
 [facility]    CSN: [id number] Arrival date & time: 08/12/23  1158      History   Chief Complaint Chief Complaint  Patient presents with   Otalgia    HPI Maria Wiley is a 48 y.o. female comes to urgent care with right ear pain over the past couple to a few days.  Patient is recovering from a recent runny nose, nasal congestion, postnasal drainage with yellowish sputum.  Patient symptoms started insidiously and has been improving over the past several days.  About 3 days ago patient started experiencing ear pain and ear fullness.  Eventually her ear popped and she has had worsening pain in the right ear.  No ear discharge.  No shortness of breath or wheezing.  No generalized bodyaches.  No ringing in the ears.  No dizziness or vertigo.  Hearing is improved HPI  Past Medical History:  Diagnosis Date   Ankylosing spondylitis (HCC)    Chest pain    COVID-19 06/2020   GERD (gastroesophageal reflux disease)    Hypertension    Migraines    Multiple sclerosis (HCC)    RA (rheumatoid arthritis) (HCC)    Thyroid  disease    Urinary incontinence     Patient Active Problem List   Diagnosis Date Noted   Neck mass 05/06/2023   Dysphagia 05/06/2023   Hot flashes 03/24/2023   Insomnia 09/30/2022   Enlarged thyroid  06/19/2022   Migraines 06/09/2022   Multiple sclerosis (HCC) 05/11/2022   Vision loss 09/26/2021   B12 deficiency 09/26/2021   Obesity 08/20/2021   Numbness and tingling 02/07/2021   Chronic migraine without aura without status migrainosus, not intractable 05/10/2019   Allergic rhinitis 05/08/2019   Essential hypertension 03/10/2019   Attention and concentration deficit 01/24/2019   Ankylosing spondylitis (HCC) 01/15/2018   Routine general medical examination at a health care facility 01/15/2018   IBS (irritable bowel syndrome) 01/15/2018   RA (rheumatoid arthritis) (HCC)    Hypothyroidism     Past Surgical History:  Procedure Laterality Date    ABDOMINAL HYSTERECTOMY     APPENDECTOMY     CHOLECYSTECTOMY     HERNIA REPAIR  2004   KNEE SURGERY Right 2006    OB History   No obstetric history on file.      Home Medications    Prior to Admission medications   Medication Sig Start Date End Date Taking? Authorizing Provider  amantadine  (SYMMETREL ) 100 MG capsule Take 1 capsule (100 mg total) by mouth 2 (two) times daily. 04/26/23     amLODipine  (NORVASC ) 5 MG tablet Take 1 tablet (5 mg total) by mouth daily. 05/12/23   Rollene Almarie LABOR, MD  aspirin  EC 81 MG tablet Take 1 tablet (81 mg total) by mouth daily. Swallow whole. 06/10/22   Gherghe, Costin M, MD  Cholecalciferol  (VITAMIN D3) 1000 units CAPS Take 1 tablet (1,000 Units) by mouth daily. 07/01/22   Rollene Almarie LABOR, MD  cyanocobalamin  (VITAMIN B12) 1000 MCG tablet Take 1 tablet (1,000 mcg total) by mouth daily. 10/22/22   Rollene Almarie LABOR, MD  cyclobenzaprine  (FLEXERIL ) 5 MG tablet Take 1 tablet (5 mg total) by mouth at bedtime as needed for muscle spasms. 01/22/23   Christopher Savannah, PA-C  diclofenac  (VOLTAREN ) 75 MG EC tablet Take 1 tablet (75 mg total) by mouth 2 (two) times daily for joint/back pain. 05/28/23     escitalopram  (LEXAPRO ) 20 MG tablet Take 1 tablet (20 mg total) by mouth daily. 05/24/23  Rollene Almarie LABOR, MD  estradiol  (CLIMARA  - DOSED IN MG/24 HR) 0.0375 mg/24hr patch Place 1 patch (0.0375 mg total) onto the skin once a week. 03/23/23   Rollene Almarie LABOR, MD  folic acid  (FOLVITE ) 1 MG tablet Take 1 tablet (1 mg total) by mouth daily. 01/14/18   Rollene Almarie LABOR, MD  gabapentin  (NEURONTIN ) 100 MG capsule Take 1 capsule (100 mg total) by mouth 2 (two) times daily as needed. 07/08/22     gabapentin  (NEURONTIN ) 300 MG capsule Take 2 capsules (600 mg total) by mouth at bedtime. 05/24/23     levothyroxine  (SYNTHROID ) 25 MCG tablet Take 1 tablet (25 mcg total) by mouth daily before breakfast. 08/24/22 08/25/23  Rollene Almarie LABOR, MD  linaclotide   (LINZESS ) 145 MCG CAPS capsule Take by mouth. 04/09/22   [provider]  Magnesium  250 MG TABS Take 1 tablet (250 mg total) by mouth daily. 07/01/22   Rollene Almarie LABOR, MD  Ofatumumab  (KESIMPTA ) 20 MG/0.4ML SOAJ Inject 0.4 mLs (20 mg total) into the skin every 28 days. 12/17/22   Jegede, Olugbemiga E, MD  solifenacin  (VESICARE ) 10 MG tablet Take 1 tablet (10 mg total) by mouth daily. 08/11/23   Rollene Almarie LABOR, MD  sulfaSALAzine  (AZULFIDINE ) 500 MG tablet Take 2 tablets (1,000 mg total) by mouth 2 (two) times daily. 05/24/23     traZODone  (DESYREL ) 50 MG tablet Take 2 tablets (100 mg total) by mouth at bedtime. 02/24/23   Rollene Almarie LABOR, MD  valACYclovir  (VALTREX ) 1000 MG tablet Take 1 tablet (1,000 mg total) by mouth 2 (two) times daily. 02/01/20   Rollene Almarie LABOR, MD    Family History Family History  Problem Relation Age of Onset   Hypertension Mother    Arthritis Mother    Irritable bowel syndrome Mother    Arthritis Maternal Grandmother    Heart disease Maternal Grandmother    Hyperlipidemia Maternal Grandmother    CAD Maternal Grandfather    Diabetes Maternal Grandfather    Hearing loss Maternal Grandfather    Heart disease Maternal Grandfather    Hyperlipidemia Maternal Grandfather    Migraines Neg Hx    Headache Neg Hx    Breast cancer Neg Hx    Colon cancer Neg Hx    Esophageal cancer Neg Hx     Social History Social History   Tobacco Use   Smoking status: Never   Smokeless tobacco: Never  Vaping Use   Vaping status: Never Used  Substance Use Topics   Alcohol use: Never   Drug use: Never     Allergies   Patient has no known allergies.   Review of Systems Review of Systems As per HPI  Physical Exam Triage Vital Signs ED Triage Vitals  Encounter Vitals Group     BP 08/12/23 1300 119/67     Systolic BP Percentile --      Diastolic BP Percentile --      Pulse Rate 08/12/23 1300 76     Resp 08/12/23 1300 18     Temp 08/12/23  1300 98.2 F (36.8 C)     Temp Source 08/12/23 1300 Oral     SpO2 08/12/23 1300 95 %     Weight --      Height --      Head Circumference --      Peak Flow --      Pain Score 08/12/23 1258 4     Pain Loc --      Pain Education --  Exclude from Growth Chart --    No data found.  Updated Vital Signs BP 119/67 (BP Location: Left Arm) Comment (BP Location): large cuff  Pulse 76   Temp 98.2 F (36.8 C) (Oral)   Resp 18   SpO2 95%   Visual Acuity Right Eye Distance:   Left Eye Distance:   Bilateral Distance:    Right Eye Near:   Left Eye Near:    Bilateral Near:     Physical Exam Vitals and nursing note reviewed.  Constitutional:      General: She is not in acute distress.    Appearance: She is not ill-appearing.  HENT:     Right Ear: Tympanic membrane normal.     Left Ear: Tympanic membrane normal.     Ears:     Comments: Bilateral middle ear effusion    Mouth/Throat:     Mouth: Mucous membranes are moist.     Pharynx: Posterior oropharyngeal erythema present.     Comments: Tonsils are 2+ with no exudate. Cardiovascular:     Rate and Rhythm: Normal rate and regular rhythm.     Pulses: Normal pulses.     Heart sounds: Normal heart sounds.  Musculoskeletal:     Cervical back: Neck supple.  Neurological:     Mental Status: She is alert.      UC Treatments / Results  Labs (all labs ordered are listed, but only abnormal results are displayed) Labs Reviewed - No data to display  EKG   Radiology No results found.  Procedures Procedures (including critical care time)  Medications Ordered in UC Medications - No data to display  Initial Impression / Assessment and Plan / UC Course  I have reviewed the triage vital signs and the nursing notes.  Pertinent labs & imaging results that were available during my care of the patient were reviewed by me and considered in my medical decision making (see chart for details).     1.  Eustachian tube  dysfunction: Warm salt water gargle Tylenol  or ibuprofen  as needed for pain and/or fever Humidifier and VapoRub use will help with eustachian tube dysfunction Return precautions given No indication for COVID or flu testing because patient has done multiple home tests for COVID and flu.  All the tests have turned up negative. Final Clinical Impressions(s) / UC Diagnoses   Final diagnoses:  Eustachian tube dysfunction, bilateral     Discharge Instructions      You may take Tylenol  or ibuprofen  as needed for pain Please gargle warm salt water to help with the symptoms Humidifier and VapoRub use will help with postnasal drainage and a cough No indication for COVID or flu testing at this time Please feel free to return to urgent care if you have persistent or worsening symptoms.   ED Prescriptions   None    PDMP not reviewed this encounter.   Blaise Aleene KIDD, MD 08/12/23 1357

## 2023-08-12 NOTE — ED Triage Notes (Signed)
 Right ear pain.  Patient has had a recent uri over the past week.    No medicines have been placed in ear.

## 2023-08-12 NOTE — Discharge Instructions (Addendum)
 You may take Tylenol  or ibuprofen  as needed for pain Please gargle warm salt water to help with the symptoms Humidifier and VapoRub use will help with postnasal drainage and a cough No indication for COVID or flu testing at this time Please feel free to return to urgent care if you have persistent or worsening symptoms.

## 2023-08-13 ENCOUNTER — Other Ambulatory Visit (HOSPITAL_COMMUNITY): Payer: Self-pay | Admitting: Pharmacy Technician

## 2023-08-13 ENCOUNTER — Other Ambulatory Visit: Payer: Self-pay

## 2023-08-13 ENCOUNTER — Other Ambulatory Visit (HOSPITAL_COMMUNITY): Payer: Self-pay

## 2023-08-13 NOTE — Progress Notes (Signed)
 Specialty Pharmacy Refill Coordination Note  Maria Wiley is a 48 y.o. female contacted today regarding refills of specialty medication(s) Ofatumumab  (Kesimpta )   Patient requested (Patient-Rptd) Delivery   Delivery date: (Patient-Rptd) 08/20/23   Verified address: (Patient-Rptd) [address] [place], [place] [address]   Medication will be filled on 08/19/23.

## 2023-08-16 ENCOUNTER — Other Ambulatory Visit (HOSPITAL_COMMUNITY): Payer: Self-pay

## 2023-08-19 ENCOUNTER — Other Ambulatory Visit (HOSPITAL_COMMUNITY): Payer: Self-pay

## 2023-08-19 ENCOUNTER — Other Ambulatory Visit: Payer: Self-pay | Admitting: Internal Medicine

## 2023-08-19 ENCOUNTER — Other Ambulatory Visit: Payer: Self-pay

## 2023-08-19 DIAGNOSIS — E039 Hypothyroidism, unspecified: Secondary | ICD-10-CM

## 2023-08-20 ENCOUNTER — Other Ambulatory Visit (HOSPITAL_COMMUNITY): Payer: Self-pay

## 2023-08-20 ENCOUNTER — Other Ambulatory Visit: Payer: Self-pay

## 2023-08-20 ENCOUNTER — Ambulatory Visit
Admission: RE | Admit: 2023-08-20 | Discharge: 2023-08-20 | Disposition: A | Discharge status: Home / Self Care | Payer: Commercial Managed Care - PPO | Source: Ambulatory Visit | Attending: Internal Medicine | Admitting: Internal Medicine

## 2023-08-20 DIAGNOSIS — Z1231 Encounter for screening mammogram for malignant neoplasm of breast: Secondary | ICD-10-CM

## 2023-08-20 MED ORDER — ESCITALOPRAM OXALATE 20 MG PO TABS
20.0000 mg | ORAL_TABLET | Freq: Every day | ORAL | 0 refills | Status: DC
  Filled 2023-08-20 (×2): qty 90, 90d supply, fill #0

## 2023-08-20 MED ORDER — GABAPENTIN 100 MG PO CAPS
100.0000 mg | ORAL_CAPSULE | Freq: Two times a day (BID) | ORAL | 3 refills | Status: AC | PRN
Start: 2023-08-20 — End: ?
  Filled 2023-08-20: qty 180, 90d supply, fill #0
  Filled 2023-11-03 – 2023-11-08 (×4): qty 180, 90d supply, fill #1

## 2023-08-20 MED ORDER — LEVOTHYROXINE SODIUM 25 MCG PO TABS
25.0000 ug | ORAL_TABLET | Freq: Every day | ORAL | 1 refills | Status: DC
  Filled 2023-08-20: qty 90, 90d supply, fill #0
  Filled 2023-11-23: qty 90, 90d supply, fill #1

## 2023-08-21 ENCOUNTER — Other Ambulatory Visit (HOSPITAL_COMMUNITY): Payer: Self-pay

## 2023-08-22 ENCOUNTER — Other Ambulatory Visit: Payer: Self-pay

## 2023-08-23 ENCOUNTER — Encounter: Payer: Self-pay | Admitting: Internal Medicine

## 2023-08-23 LAB — HM MAMMOGRAPHY

## 2023-08-24 ENCOUNTER — Encounter (HOSPITAL_COMMUNITY): Payer: Self-pay

## 2023-08-24 ENCOUNTER — Other Ambulatory Visit (HOSPITAL_COMMUNITY): Payer: Self-pay

## 2023-08-26 ENCOUNTER — Encounter: Payer: Self-pay | Admitting: Internal Medicine

## 2023-08-26 ENCOUNTER — Other Ambulatory Visit (HOSPITAL_COMMUNITY): Payer: Self-pay

## 2023-08-26 DIAGNOSIS — M450 Ankylosing spondylitis of multiple sites in spine: Secondary | ICD-10-CM | POA: Diagnosis not present

## 2023-08-26 MED ORDER — SULFASALAZINE 500 MG PO TABS
1000.0000 mg | ORAL_TABLET | Freq: Two times a day (BID) | ORAL | 2 refills | Status: DC
  Filled 2023-08-26: qty 120, 30d supply, fill #0
  Filled 2023-09-25: qty 120, 30d supply, fill #1
  Filled 2023-10-25 – 2023-10-29 (×2): qty 120, 30d supply, fill #2

## 2023-08-26 MED ORDER — AMANTADINE HCL 100 MG PO CAPS
100.0000 mg | ORAL_CAPSULE | Freq: Two times a day (BID) | ORAL | 3 refills | Status: DC
Start: 2023-08-26 — End: 2024-05-04
  Filled 2023-08-26: qty 180, 90d supply, fill #0
  Filled 2023-11-23: qty 180, 90d supply, fill #1
  Filled 2024-02-22: qty 180, 90d supply, fill #2

## 2023-08-31 ENCOUNTER — Other Ambulatory Visit (HOSPITAL_COMMUNITY): Payer: Self-pay

## 2023-09-02 ENCOUNTER — Telehealth: Payer: Commercial Managed Care - PPO | Admitting: Internal Medicine

## 2023-09-02 NOTE — Progress Notes (Signed)
Error

## 2023-09-06 ENCOUNTER — Telehealth: Payer: Commercial Managed Care - PPO | Admitting: Internal Medicine

## 2023-09-15 ENCOUNTER — Other Ambulatory Visit: Payer: Self-pay

## 2023-09-15 NOTE — Progress Notes (Signed)
Specialty Pharmacy Refill Coordination Note  Maria Wiley is a 48 y.o. female contacted today regarding refills of specialty medication(s) Ofatumumab [name])   Patient requested Delivery   Delivery date: 09/23/23   Verified address: [address] [place], [place] [address]   Medication will be filled on 09/23/2023.

## 2023-09-20 ENCOUNTER — Other Ambulatory Visit (HOSPITAL_COMMUNITY): Payer: Self-pay

## 2023-09-20 ENCOUNTER — Encounter (HOSPITAL_COMMUNITY): Payer: Self-pay | Admitting: Emergency Medicine

## 2023-09-20 ENCOUNTER — Other Ambulatory Visit: Payer: Self-pay

## 2023-09-20 ENCOUNTER — Ambulatory Visit (HOSPITAL_COMMUNITY)
Admission: EM | Admit: 2023-09-20 | Discharge: 2023-09-20 | Disposition: A | Discharge status: Home / Self Care | Payer: Commercial Managed Care - PPO | Attending: Family Medicine | Admitting: Family Medicine

## 2023-09-20 DIAGNOSIS — R0982 Postnasal drip: Secondary | ICD-10-CM

## 2023-09-20 DIAGNOSIS — J069 Acute upper respiratory infection, unspecified: Secondary | ICD-10-CM

## 2023-09-20 MED ORDER — FLUTICASONE PROPIONATE 50 MCG/ACT NA SUSP
2.0000 | Freq: Every day | NASAL | 1 refills | Status: AC
  Filled 2023-09-20: qty 16, 30d supply, fill #0
  Filled 2023-10-23: qty 16, 30d supply, fill #1

## 2023-09-20 NOTE — ED Provider Notes (Signed)
 [facility]    CSN: [id number] Arrival date & time: 09/20/23  1019      History   Chief Complaint Chief Complaint  Patient presents with   URI   Appointment    10:30    HPI Maria Wiley is a 48 y.o. female.   Patient is presenting with a few day history of sinus congestion as well as cough.  Patient notes that on Friday she noticed she was having some sinus pressure in her maxillary area.  Patient also notes that a week before that she did have a sore throat.  Patient notes a cough that is usually worse in the morning and notes some scratchy itchy throat feelings.  Patient denies any fevers or chills or any other systemic symptoms at this time.  Patient does have a history of MS and does take immune modulating medications   URI   Past Medical History:  Diagnosis Date   Ankylosing spondylitis (HCC)    Chest pain    COVID-19 06/2020   GERD (gastroesophageal reflux disease)    Hypertension    Migraines    Multiple sclerosis (HCC)    RA (rheumatoid arthritis) (HCC)    Thyroid disease    Urinary incontinence     Patient Active Problem List   Diagnosis Date Noted   Neck mass 05/06/2023   Dysphagia 05/06/2023   Hot flashes 03/24/2023   Insomnia 09/30/2022   Enlarged thyroid 06/19/2022   Migraines 06/09/2022   Multiple sclerosis (HCC) 05/11/2022   Vision loss 09/26/2021   B12 deficiency 09/26/2021   Obesity 08/20/2021   Numbness and tingling 02/07/2021   Chronic migraine without aura without status migrainosus, not intractable 05/10/2019   Allergic rhinitis 05/08/2019   Essential hypertension 03/10/2019   Attention and concentration deficit 01/24/2019   Ankylosing spondylitis (HCC) 01/15/2018   Routine general medical examination at a health care facility 01/15/2018   IBS (irritable bowel syndrome) 01/15/2018   RA (rheumatoid arthritis) (HCC)    Hypothyroidism     Past Surgical History:  Procedure Laterality Date   ABDOMINAL HYSTERECTOMY      APPENDECTOMY     CHOLECYSTECTOMY     HERNIA REPAIR  2004   KNEE SURGERY Right 2006    OB History   No obstetric history on file.      Home Medications    Prior to Admission medications   Medication Sig Start Date End Date Taking? Authorizing Provider  fluticasone (FLONASE) 50 MCG/ACT nasal spray Place 2 sprays into both nostrils daily. 09/20/23  Yes Brenton Grills, MD  amantadine (SYMMETREL) 100 MG capsule Take 1 capsule (100 mg total) by mouth 2 (two) times daily. 08/26/23     amLODipine (NORVASC) 5 MG tablet Take 1 tablet (5 mg total) by mouth daily. 05/12/23   Myrlene Broker, MD  aspirin EC 81 MG tablet Take 1 tablet (81 mg total) by mouth daily. Swallow whole. 06/10/22   Leatha Gilding, MD  Cholecalciferol (VITAMIN D3) 1000 units CAPS Take 1 tablet (1,000 Units) by mouth daily. 07/01/22   Myrlene Broker, MD  cyanocobalamin (VITAMIN B12) 1000 MCG tablet Take 1 tablet (1,000 mcg total) by mouth daily. 10/22/22   Myrlene Broker, MD  cyclobenzaprine (FLEXERIL) 5 MG tablet Take 1 tablet (5 mg total) by mouth at bedtime as needed for muscle spasms. 01/22/23   Wallis Bamberg, PA-C  diclofenac (VOLTAREN) 75 MG EC tablet Take 1 tablet (75 mg total) by mouth 2 (two) times daily  for joint/back pain. 05/28/23     escitalopram (LEXAPRO) 20 MG tablet Take 1 tablet (20 mg total) by mouth daily. 08/20/23   Myrlene Broker, MD  estradiol (CLIMARA - DOSED IN MG/24 HR) 0.0375 mg/24hr patch Place 1 patch (0.0375 mg total) onto the skin once a week. 03/23/23   Myrlene Broker, MD  folic acid (FOLVITE) 1 MG tablet Take 1 tablet (1 mg total) by mouth daily. 01/14/18   Myrlene Broker, MD  gabapentin (NEURONTIN) 100 MG capsule Take 1 capsule (100 mg total) by mouth 2 (two) times daily as needed. 08/20/23     gabapentin (NEURONTIN) 300 MG capsule Take 2 capsules (600 mg total) by mouth at bedtime. 05/24/23     levothyroxine (SYNTHROID) 25 MCG tablet Take 1 tablet (25 mcg total) by  mouth daily before breakfast. 08/20/23 08/19/24  Myrlene Broker, MD  linaclotide [facility]) 145 MCG CAPS capsule Take by mouth. 04/09/22   [provider]  Magnesium 250 MG TABS Take 1 tablet (250 mg total) by mouth daily. 07/01/22   Myrlene Broker, MD  Ofatumumab (KESIMPTA) 20 MG/0.4ML SOAJ Inject 0.4 mLs (20 mg total) into the skin every 28 days. 12/17/22   Quentin Angst, MD  solifenacin (VESICARE) 10 MG tablet Take 1 tablet (10 mg total) by mouth daily. 08/11/23   Myrlene Broker, MD  sulfaSALAzine (AZULFIDINE) 500 MG tablet Take 2 tablets (1,000 mg total) by mouth 2 (two) times daily. 08/26/23     traZODone (DESYREL) 50 MG tablet Take 2 tablets (100 mg total) by mouth at bedtime. 02/24/23   Myrlene Broker, MD  valACYclovir (VALTREX) 1000 MG tablet Take 1 tablet (1,000 mg total) by mouth 2 (two) times daily. 02/01/20   Myrlene Broker, MD    Family History Family History  Problem Relation Age of Onset   Hypertension Mother    Arthritis Mother    Irritable bowel syndrome Mother    Arthritis Maternal Grandmother    Heart disease Maternal Grandmother    Hyperlipidemia Maternal Grandmother    CAD Maternal Grandfather    Diabetes Maternal Grandfather    Hearing loss Maternal Grandfather    Heart disease Maternal Grandfather    Hyperlipidemia Maternal Grandfather    Migraines Neg Hx    Headache Neg Hx    Breast cancer Neg Hx    Colon cancer Neg Hx    Esophageal cancer Neg Hx     Social History Social History   Tobacco Use   Smoking status: Never   Smokeless tobacco: Never  Vaping Use   Vaping status: Never Used  Substance Use Topics   Alcohol use: Never   Drug use: Never     Allergies   Patient has no known allergies.   Review of Systems Review of Systems   Physical Exam Triage Vital Signs ED Triage Vitals  Encounter Vitals Group     BP 09/20/23 1030 116/75     Systolic BP Percentile --      Diastolic BP Percentile --       Pulse Rate 09/20/23 1030 77     Resp 09/20/23 1030 18     Temp 09/20/23 1030 98.1 F (36.7 C)     Temp Source 09/20/23 1030 Oral     SpO2 09/20/23 1030 98 %     Weight --      Height --      Head Circumference --      Peak Flow --  Pain Score 09/20/23 1028 2     Pain Loc --      Pain Education --      Exclude from Growth Chart --    No data found.  Updated Vital Signs BP 116/75 (BP Location: Left Arm)   Pulse 77   Temp 98.1 F (36.7 C) (Oral)   Resp 18   SpO2 98%   Visual Acuity Right Eye Distance:   Left Eye Distance:   Bilateral Distance:    Right Eye Near:   Left Eye Near:    Bilateral Near:     Physical Exam Constitutional:      Appearance: Normal appearance.  HENT:     Nose: Congestion and rhinorrhea present.     Mouth/Throat:     Mouth: Mucous membranes are moist.     Pharynx: Oropharynx is clear. Posterior oropharyngeal erythema present. No oropharyngeal exudate.  Eyes:     Conjunctiva/sclera: Conjunctivae normal.     Pupils: Pupils are equal, round, and reactive to light.  Cardiovascular:     Rate and Rhythm: Normal rate.  Neurological:     Mental Status: She is alert.      UC Treatments / Results  Labs (all labs ordered are listed, but only abnormal results are displayed) Labs Reviewed - No data to display  EKG   Radiology No results found.  Procedures Procedures (including critical care time)  Medications Ordered in UC Medications - No data to display  Initial Impression / Assessment and Plan / UC Course  I have reviewed the triage vital signs and the nursing notes.  Pertinent labs & imaging results that were available during my care of the patient were reviewed by me and considered in my medical decision making (see chart for details).     Discussed with patient that at this time likely no concern for sinus infection with a bacterial component and patient likely dealing with a viral component.  Did recommend starting Flonase  2 sprays each nostril nightly in order to decrease the amount of congestion/mucus production as well as Mucinex.  Patient can try other symptomatic therapies such as heat/hot water.  Did advise patient that if her symptoms do not improve over the next 10 days we will go ahead and start antibiotics as the static nature of mucus can result in a bacterial infection.  Patient understanding and agreeable with plan. Final Clinical Impressions(s) / UC Diagnoses   Final diagnoses:  Viral upper respiratory tract infection  PND (post-nasal drip)   Discharge Instructions   None    ED Prescriptions     Medication Sig Dispense Auth. Provider   fluticasone (FLONASE) 50 MCG/ACT nasal spray Place 2 sprays into both nostrils daily. 19.2 mL Brenton Grills, MD      PDMP not reviewed this encounter.   Brenton Grills, MD 09/20/23 1058

## 2023-09-20 NOTE — Progress Notes (Signed)
 Patient was contacted via mychart that due to possible impending winter storm, medication will arrive on Tuesday 09/21/23.

## 2023-09-20 NOTE — ED Triage Notes (Signed)
 Onset Friday evening of symptoms.  Has facial pressure, headache, sinus drainage, blowing green secretions from nose.    Has taken nyquil.  No medicines today

## 2023-09-21 ENCOUNTER — Other Ambulatory Visit: Payer: Self-pay

## 2023-09-25 ENCOUNTER — Other Ambulatory Visit (HOSPITAL_COMMUNITY): Payer: Self-pay

## 2023-09-26 ENCOUNTER — Other Ambulatory Visit: Payer: Self-pay

## 2023-10-05 ENCOUNTER — Other Ambulatory Visit: Payer: Self-pay

## 2023-10-06 ENCOUNTER — Other Ambulatory Visit: Payer: Self-pay

## 2023-10-06 ENCOUNTER — Other Ambulatory Visit (HOSPITAL_COMMUNITY): Payer: Self-pay

## 2023-10-06 NOTE — Progress Notes (Signed)
 Specialty Pharmacy Refill Coordination Note  Maria Wiley is a 48 y.o. female contacted today regarding refills of specialty medication(s) Ofatumumab [name])   Patient requested (Patient-Rptd) Delivery   Delivery date: 10/19/23   Verified address: (Patient-Rptd) [address] [place] [address]   Medication will be filled on 10/18/23.   Sent patient mychart message to inform of updated delivery date of 3/18.

## 2023-10-07 DIAGNOSIS — M450 Ankylosing spondylitis of multiple sites in spine: Secondary | ICD-10-CM | POA: Diagnosis not present

## 2023-10-18 ENCOUNTER — Other Ambulatory Visit: Payer: Self-pay

## 2023-10-18 ENCOUNTER — Other Ambulatory Visit (HOSPITAL_COMMUNITY): Payer: Self-pay

## 2023-10-19 ENCOUNTER — Ambulatory Visit (INDEPENDENT_AMBULATORY_CARE_PROVIDER_SITE_OTHER): Admitting: Internal Medicine

## 2023-10-19 ENCOUNTER — Other Ambulatory Visit (HOSPITAL_COMMUNITY): Payer: Self-pay

## 2023-10-19 ENCOUNTER — Encounter: Payer: Self-pay | Admitting: Internal Medicine

## 2023-10-19 ENCOUNTER — Ambulatory Visit (INDEPENDENT_AMBULATORY_CARE_PROVIDER_SITE_OTHER)

## 2023-10-19 VITALS — BP 124/72 | HR 76 | Temp 98.1°F | Ht 67.0 in | Wt 215.0 lb

## 2023-10-19 DIAGNOSIS — E063 Autoimmune thyroiditis: Secondary | ICD-10-CM | POA: Insufficient documentation

## 2023-10-19 DIAGNOSIS — R600 Localized edema: Secondary | ICD-10-CM | POA: Insufficient documentation

## 2023-10-19 DIAGNOSIS — E538 Deficiency of other specified B group vitamins: Secondary | ICD-10-CM | POA: Diagnosis not present

## 2023-10-19 DIAGNOSIS — D649 Anemia, unspecified: Secondary | ICD-10-CM | POA: Diagnosis not present

## 2023-10-19 DIAGNOSIS — I10 Essential (primary) hypertension: Secondary | ICD-10-CM

## 2023-10-19 DIAGNOSIS — R0989 Other specified symptoms and signs involving the circulatory and respiratory systems: Secondary | ICD-10-CM | POA: Diagnosis not present

## 2023-10-19 LAB — BASIC METABOLIC PANEL
BUN: 16 mg/dL (ref 6–23)
CO2: 27 meq/L (ref 19–32)
Calcium: 9.2 mg/dL (ref 8.4–10.5)
Chloride: 101 meq/L (ref 96–112)
Creatinine, Ser: 0.85 mg/dL (ref 0.40–1.20)
GFR: 80.63 mL/min (ref >60.00)
Glucose, Bld: 78 mg/dL (ref 70–99)
Potassium: 4.4 meq/L (ref 3.5–5.1)
Sodium: 136 meq/L (ref 135–145)

## 2023-10-19 LAB — CBC WITH DIFFERENTIAL/PLATELET
Basophils Absolute: 0.1 10*3/uL (ref 0.0–0.1)
Basophils Relative: 0.9 % (ref 0.0–3.0)
Eosinophils Absolute: 0.3 10*3/uL (ref 0.0–0.7)
Eosinophils Relative: 4.9 % (ref 0.0–5.0)
HCT: 33.9 % — ABNORMAL LOW (ref 36.0–46.0)
Hemoglobin: 11 g/dL — ABNORMAL LOW (ref 12.0–15.0)
Lymphocytes Relative: 31.8 % (ref 12.0–46.0)
Lymphs Abs: 1.9 10*3/uL (ref 0.7–4.0)
MCHC: 32.5 g/dL (ref 30.0–36.0)
MCV: 85.8 fl (ref 78.0–100.0)
Monocytes Absolute: 0.6 10*3/uL (ref 0.1–1.0)
Monocytes Relative: 9.8 % (ref 3.0–12.0)
Neutro Abs: 3.1 10*3/uL (ref 1.4–7.7)
Neutrophils Relative %: 52.6 % (ref 43.0–77.0)
Platelets: 253 10*3/uL (ref 150.0–400.0)
RBC: 3.95 Mil/uL (ref 3.87–5.11)
RDW: 14.3 % (ref 11.5–15.5)
WBC: 5.9 10*3/uL (ref 4.0–10.5)

## 2023-10-19 LAB — HEPATIC FUNCTION PANEL
ALT: 12 U/L (ref 0–35)
AST: 16 U/L (ref 0–37)
Albumin: 4.2 g/dL (ref 3.5–5.2)
Alkaline Phosphatase: 50 U/L (ref 39–117)
Bilirubin, Direct: 0.1 mg/dL (ref 0.0–0.3)
Total Bilirubin: 0.4 mg/dL (ref 0.2–1.2)
Total Protein: 7.3 g/dL (ref 6.0–8.3)

## 2023-10-19 LAB — URINALYSIS, ROUTINE W REFLEX MICROSCOPIC
Bilirubin Urine: NEGATIVE
Hgb urine dipstick: NEGATIVE
Ketones, ur: NEGATIVE
Leukocytes,Ua: NEGATIVE
Nitrite: NEGATIVE
RBC / HPF: NONE SEEN (ref 0–?)
Specific Gravity, Urine: 1.02 (ref 1.000–1.030)
Total Protein, Urine: NEGATIVE
Urine Glucose: NEGATIVE
Urobilinogen, UA: 0.2 (ref 0.0–1.0)
pH: 7 (ref 5.0–8.0)

## 2023-10-19 LAB — FERRITIN: Ferritin: 89.8 ng/mL (ref 10.0–291.0)

## 2023-10-19 LAB — BRAIN NATRIURETIC PEPTIDE: Pro B Natriuretic peptide (BNP): 11 pg/mL (ref 0.0–100.0)

## 2023-10-19 LAB — IBC PANEL
Iron: 57 ug/dL (ref 42–145)
Saturation Ratios: 19.9 % — ABNORMAL LOW (ref 20.0–50.0)
TIBC: 287 ug/dL (ref 250.0–450.0)
Transferrin: 205 mg/dL — ABNORMAL LOW (ref 212.0–360.0)

## 2023-10-19 MED ORDER — HYDROCHLOROTHIAZIDE 12.5 MG PO CAPS
12.5000 mg | ORAL_CAPSULE | Freq: Every day | ORAL | 3 refills | Status: DC
  Filled 2023-10-19: qty 90, 90d supply, fill #0
  Filled 2024-01-14 – 2024-01-25 (×2): qty 90, 90d supply, fill #1
  Filled 2024-04-24: qty 90, 90d supply, fill #2

## 2023-10-19 NOTE — Progress Notes (Signed)
 Patient ID: Maria Wiley, female   DOB: Sep 05, 1975, 48 y.o.   MRN: [id number]        Chief Complaint: follow up new bilateral leg swelling right > left x 3 wks       HPI:  Maria Wiley is a 48 y.o. female here with above, but Pt denies chest pain, increased sob or doe, wheezing, orthopnea, PND, palpitations, dizziness or syncope. No prior hx of pulm, hepatic, renal dz,.  Does tend to stand with leg dependent every day at work for years.  Obesity has been significant.  Did have recent cbc only with rheumatology she shows on the phone from [facility] with Hgb 10.4.but no hx of chronic gi loss .   Pt denies polydipsia, polyuria, or new focal neuro s/s.    Pt denies fever, wt loss, night sweats, loss of appetite, or other constitutional symptoms        Wt Readings from Last 3 Encounters:  10/19/23 215 lb (97.5 kg)  05/25/23 209 lb (94.8 kg)  05/10/23 210 lb (95.3 kg)   BP Readings from Last 3 Encounters:  10/19/23 124/72  09/20/23 116/75  08/12/23 119/67         Past Medical History:  Diagnosis Date   Ankylosing spondylitis (HCC)    Chest pain    COVID-19 06/2020   GERD (gastroesophageal reflux disease)    Hypertension    Migraines    Multiple sclerosis (HCC)    RA (rheumatoid arthritis) (HCC)    Thyroid disease    Urinary incontinence    Past Surgical History:  Procedure Laterality Date   ABDOMINAL HYSTERECTOMY     APPENDECTOMY     CHOLECYSTECTOMY     HERNIA REPAIR  2004   KNEE SURGERY Right 2006    reports that she has never smoked. She has never used smokeless tobacco. She reports that she does not drink alcohol and does not use drugs. family history includes Arthritis in her maternal grandmother and mother; CAD in her maternal grandfather; Diabetes in her maternal grandfather; Hearing loss in her maternal grandfather; Heart disease in her maternal grandfather and maternal grandmother; Hyperlipidemia in her maternal grandfather and maternal grandmother; Hypertension in her  mother; Irritable bowel syndrome in her mother. No Known Allergies Current Outpatient Medications on File Prior to Visit  Medication Sig Dispense Refill   amantadine (SYMMETREL) 100 MG capsule Take 1 capsule (100 mg total) by mouth 2 (two) times daily. 180 capsule 3   amLODipine (NORVASC) 5 MG tablet Take 1 tablet (5 mg total) by mouth daily. 90 tablet 1   aspirin EC 81 MG tablet Take 1 tablet (81 mg total) by mouth daily. Swallow whole. 30 tablet 12   Cholecalciferol (VITAMIN D3) 1000 units CAPS Take 1 tablet (1,000 Units) by mouth daily. 90 capsule 0   cyanocobalamin (VITAMIN B12) 1000 MCG tablet Take 1 tablet (1,000 mcg total) by mouth daily. 90 tablet 3   cyclobenzaprine (FLEXERIL) 5 MG tablet Take 1 tablet (5 mg total) by mouth at bedtime as needed for muscle spasms. 30 tablet 0   diclofenac (VOLTAREN) 75 MG EC tablet Take 1 tablet (75 mg total) by mouth 2 (two) times daily for joint/back pain. 60 tablet 4   escitalopram (LEXAPRO) 20 MG tablet Take 1 tablet (20 mg total) by mouth daily. 90 tablet 0   estradiol (CLIMARA - DOSED IN MG/24 HR) 0.0375 mg/24hr patch Place 1 patch (0.0375 mg total) onto the skin once a week. 4 patch 12  fluticasone (FLONASE) 50 MCG/ACT nasal spray Place 2 sprays into both nostrils daily. 16 g 1   folic acid (FOLVITE) 1 MG tablet Take 1 tablet (1 mg total) by mouth daily. 90 tablet 3   gabapentin (NEURONTIN) 100 MG capsule Take 1 capsule (100 mg total) by mouth 2 (two) times daily as needed. 180 capsule 3   gabapentin (NEURONTIN) 300 MG capsule Take 2 capsules (600 mg total) by mouth at bedtime. 60 capsule 6   levothyroxine (SYNTHROID) 25 MCG tablet Take 1 tablet (25 mcg total) by mouth daily before breakfast. 90 tablet 1   linaclotide (LINZESS) 145 MCG CAPS capsule Take by mouth.     Magnesium 250 MG TABS Take 1 tablet (250 mg total) by mouth daily. 90 tablet 0   Ofatumumab (KESIMPTA) 20 MG/0.4ML SOAJ Inject 0.4 mLs (20 mg total) into the skin every 28 days. 0.4  mL 11   solifenacin (VESICARE) 10 MG tablet Take 1 tablet (10 mg total) by mouth daily. 90 tablet 0   sulfaSALAzine (AZULFIDINE) 500 MG tablet Take 2 tablets (1,000 mg total) by mouth 2 (two) times daily. 120 tablet 2   traZODone (DESYREL) 50 MG tablet Take 2 tablets (100 mg total) by mouth at bedtime. 180 tablet 3   valACYclovir (VALTREX) 1000 MG tablet Take 1 tablet (1,000 mg total) by mouth 2 (two) times daily. 60 tablet 3   No current facility-administered medications on file prior to visit.        ROS:  All others reviewed and negative.  Objective        PE:  BP 124/72 (BP Location: Left Arm, Patient Position: Sitting, Cuff Size: Normal)   Pulse 76   Temp 98.1 F (36.7 C) (Oral)   Ht 5\' 7"  (1.702 m)   Wt 215 lb (97.5 kg)   SpO2 98%   BMI 33.67 kg/m                 Constitutional: Pt appears in NAD               HENT: Head: NCAT.                Right Ear: External ear normal.                 Left Ear: External ear normal.                Eyes: . Pupils are equal, round, and reactive to light. Conjunctivae and EOM are normal               Nose: without d/c or deformity               Neck: Neck supple. Gross normal ROM               Cardiovascular: Normal rate and regular rhythm.                 Pulmonary/Chest: Effort normal and breath sounds without rales or wheezing.                Abd:  Soft, NT, ND, + BS, no organomegaly               Neurological: Pt is alert. At baseline orientation, motor grossly intact               Skin: Skin is warm. LE edema - trace to 1+ Right > left wit vague patches of slight erythema areas nontender  Psychiatric: Pt behavior is normal without agitation   Micro: none  Cardiac tracings I have personally interpreted today:  ECG - NSR with nonspecific changes, 70  Pertinent Radiological findings (summarize): none   Lab Results  Component Value Date   WBC 5.9 10/19/2023   HGB 11.0 (L) 10/19/2023   HCT 33.9 (L) 10/19/2023   PLT  253.0 10/19/2023   GLUCOSE 78 10/19/2023   CHOL 208 (H) 06/10/2022   TRIG 75 06/10/2022   HDL 39 (L) 06/10/2022   LDLCALC 154 (H) 06/10/2022   ALT 12 10/19/2023   AST 16 10/19/2023   NA 136 10/19/2023   K 4.4 10/19/2023   CL 101 10/19/2023   CREATININE 0.85 10/19/2023   BUN 16 10/19/2023   CO2 27 10/19/2023   TSH 1.74 05/06/2023   INR 1.2 06/09/2022   HGBA1C 5.1 06/09/2022   Assessment/Plan:  Maria Wiley is a 48 y.o. Black or African American [2] female with  has a past medical history of Ankylosing spondylitis (HCC), Chest pain, COVID-19 (06/2020), GERD (gastroesophageal reflux disease), Hypertension, Migraines, Multiple sclerosis (HCC), RA (rheumatoid arthritis) (HCC), Thyroid disease, and Urinary incontinence.  Peripheral edema Gradual onset x several wks now uncomfortable with some reddish areas coming up right > left legs to the knees, seems most likely venous insufficiency bu now for ecg., cxr, basic labs but also iron and BNP, and trial hct 12. 5 mg daily, leg elevation, compression stocking, low salt, and f/u PCP in 2 weeks  B12 deficiency Lab Results  Component Value Date   VITAMINB12 232 08/19/2021   Low, to start oral replacement - b12 1000 mcg qd   Essential hypertension BP Readings from Last 3 Encounters:  10/19/23 124/72  09/20/23 116/75  08/12/23 119/67   Stable, pt to continue medical treatment norvasc 5 qd   Anemia Also for iron with labs, but likely anemia chronic dz  Followup: Return in about 2 weeks (around 11/02/2023).  Oliver Barre, MD 10/23/2023 1:39 PM [facility]

## 2023-10-19 NOTE — Assessment & Plan Note (Addendum)
 Gradual onset x several wks now uncomfortable with some reddish areas coming up right > left legs to the knees, seems most likely venous insufficiency bu now for ecg., cxr, basic labs but also iron and BNP, and trial hct 12. 5 mg daily, leg elevation, compression stocking, low salt, and f/u PCP in 2 weeks

## 2023-10-19 NOTE — Patient Instructions (Signed)
 Please take all new medication as prescribed - the HCT 12.5 mg per day  Your EKG was done today  Please continue all other medications as before, and refills have been done if requested.  Please have the pharmacy call with any other refills you may need.  Please continue your efforts at being more active, low cholesterol diet, and weight control.  You are otherwise up to date with prevention measures today.  Please keep your appointments with your specialists as you may have planned  Please go to the XRAY Department in the first floor for the x-ray testing  Please go to the LAB at the blood drawing area for the tests to be done  You will be contacted by phone if any changes need to be made immediately.  Otherwise, you will receive a letter about your results with an explanation, but please check with MyChart first.  Please make an Appointment to return in 2 weeks to Dr Okey Dupre, or sooner if needed

## 2023-10-23 ENCOUNTER — Encounter: Payer: Self-pay | Admitting: Internal Medicine

## 2023-10-23 NOTE — Assessment & Plan Note (Addendum)
 BP Readings from Last 3 Encounters:  10/19/23 124/72  09/20/23 116/75  08/12/23 119/67   Stable, pt to continue medical treatment norvasc 5 qd

## 2023-10-23 NOTE — Assessment & Plan Note (Signed)
 Lab Results  Component Value Date   VITAMINB12 232 08/19/2021   Low, to start oral replacement - b12 1000 mcg qd

## 2023-10-23 NOTE — Assessment & Plan Note (Addendum)
 Also for iron with labs, but likely anemia chronic dz

## 2023-10-25 ENCOUNTER — Other Ambulatory Visit: Payer: Self-pay

## 2023-10-25 ENCOUNTER — Other Ambulatory Visit (HOSPITAL_COMMUNITY): Payer: Self-pay

## 2023-10-25 MED ORDER — DICLOFENAC SODIUM 75 MG PO TBEC
75.0000 mg | DELAYED_RELEASE_TABLET | Freq: Two times a day (BID) | ORAL | 4 refills | Status: DC
  Filled 2023-10-25 – 2023-11-02 (×2): qty 60, 30d supply, fill #0
  Filled 2023-12-03: qty 60, 30d supply, fill #1
  Filled 2024-01-03: qty 60, 30d supply, fill #2
  Filled 2024-02-02: qty 60, 30d supply, fill #3
  Filled 2024-02-25 – 2024-02-28 (×2): qty 60, 30d supply, fill #4

## 2023-10-29 ENCOUNTER — Other Ambulatory Visit (HOSPITAL_COMMUNITY): Payer: Self-pay

## 2023-11-02 ENCOUNTER — Encounter: Payer: Self-pay | Admitting: Internal Medicine

## 2023-11-02 ENCOUNTER — Other Ambulatory Visit (HOSPITAL_COMMUNITY): Payer: Self-pay

## 2023-11-02 ENCOUNTER — Ambulatory Visit (INDEPENDENT_AMBULATORY_CARE_PROVIDER_SITE_OTHER): Admitting: Internal Medicine

## 2023-11-02 VITALS — BP 120/88 | HR 88 | Temp 98.4°F | Ht 67.0 in | Wt 210.0 lb

## 2023-11-02 DIAGNOSIS — R600 Localized edema: Secondary | ICD-10-CM | POA: Diagnosis not present

## 2023-11-02 NOTE — Assessment & Plan Note (Signed)
 We discussed results of workup including normal CMP, BNP, U/A, iron. Recent thyroid normal Oct 2024. She can try using hydrochlorothiazide prn only now. If recurrent she will let us know. Hydrate and avoid sodium. She is active.

## 2023-11-02 NOTE — Progress Notes (Signed)
   Subjective:   Patient ID: Maria Wiley, female    DOB: 18-Apr-1976, 48 y.o.   MRN: [id number]  HPI The patient is a 48 YO female coming in for swelling in legs. Started a few weeks ago. Was seen and evaluated and started hydrochlorothiazide 12.5 mg daily. This has helped fluid. Denies excessive urination. No side effects. Previous did not change diet, fluids, sickness, travel.   Review of Systems  Constitutional: Negative.   HENT: Negative.    Eyes: Negative.   Respiratory:  Negative for cough, chest tightness and shortness of breath.   Cardiovascular:  Negative for chest pain, palpitations and leg swelling.  Gastrointestinal:  Negative for abdominal distention, abdominal pain, constipation, diarrhea, nausea and vomiting.  Musculoskeletal: Negative.   Skin: Negative.   Neurological: Negative.   Psychiatric/Behavioral: Negative.      Objective:  Physical Exam Constitutional:      Appearance: She is well-developed.  HENT:     Head: Normocephalic and atraumatic.  Cardiovascular:     Rate and Rhythm: Normal rate and regular rhythm.  Pulmonary:     Effort: Pulmonary effort is normal. No respiratory distress.     Breath sounds: Normal breath sounds. No wheezing or rales.  Abdominal:     General: Bowel sounds are normal. There is no distension.     Palpations: Abdomen is soft.     Tenderness: There is no abdominal tenderness. There is no rebound.  Musculoskeletal:     Cervical back: Normal range of motion.  Skin:    General: Skin is warm and dry.  Neurological:     Mental Status: She is alert and oriented to person, place, and time.     Coordination: Coordination normal.     Vitals:   11/02/23 0840  BP: 120/88  Pulse: 88  Temp: 98.4 F (36.9 C)  TempSrc: Oral  SpO2: 98%  Weight: 210 lb (95.3 kg)  Height: 5\' 7"  (1.702 m)    Assessment & Plan:  Visit time 15 minutes in face to face communication with patient and coordination of care, additional 10 minutes spent  in record review, coordination or care, ordering tests, communicating/referring to other healthcare professionals, documenting in medical records all on the same day of the visit for total time 25 minutes spent on the visit.

## 2023-11-03 ENCOUNTER — Other Ambulatory Visit: Payer: Self-pay | Admitting: Internal Medicine

## 2023-11-03 ENCOUNTER — Other Ambulatory Visit (HOSPITAL_COMMUNITY): Payer: Self-pay

## 2023-11-03 ENCOUNTER — Other Ambulatory Visit: Payer: Self-pay

## 2023-11-03 MED ORDER — SOLIFENACIN SUCCINATE 10 MG PO TABS
10.0000 mg | ORAL_TABLET | Freq: Every day | ORAL | 0 refills | Status: DC
  Filled 2023-11-03 – 2023-11-04 (×3): qty 90, 90d supply, fill #0

## 2023-11-04 ENCOUNTER — Other Ambulatory Visit (HOSPITAL_COMMUNITY): Payer: Self-pay

## 2023-11-08 ENCOUNTER — Other Ambulatory Visit (HOSPITAL_COMMUNITY): Payer: Self-pay

## 2023-11-11 ENCOUNTER — Other Ambulatory Visit: Payer: Self-pay

## 2023-11-11 NOTE — Progress Notes (Signed)
 Specialty Pharmacy Refill Coordination Note  Maria Wiley is a 48 y.o. female contacted today regarding refills of specialty medication(s) Ofatumumab [name])   Patient requested (Patient-Rptd) Delivery   Delivery date: (Patient-Rptd) 11/22/23   Verified address: (Patient-Rptd) [address], [place], [place] [address]   Medication will be filled on 04.21.25 since refrigerated medications cannot be delivered on Mondays. Patient will be notified via mychart.

## 2023-11-22 ENCOUNTER — Other Ambulatory Visit (HOSPITAL_COMMUNITY): Payer: Self-pay

## 2023-11-22 ENCOUNTER — Other Ambulatory Visit: Payer: Self-pay

## 2023-11-23 ENCOUNTER — Other Ambulatory Visit: Payer: Self-pay | Admitting: Internal Medicine

## 2023-11-23 ENCOUNTER — Other Ambulatory Visit: Payer: Self-pay

## 2023-11-23 ENCOUNTER — Other Ambulatory Visit (HOSPITAL_COMMUNITY): Payer: Self-pay

## 2023-11-23 ENCOUNTER — Other Ambulatory Visit: Payer: Self-pay | Admitting: Family Medicine

## 2023-11-23 MED ORDER — SULFASALAZINE 500 MG PO TABS
1000.0000 mg | ORAL_TABLET | Freq: Two times a day (BID) | ORAL | 1 refills | Status: DC
  Filled 2023-11-23: qty 120, 30d supply, fill #0
  Filled 2023-12-20: qty 120, 30d supply, fill #1

## 2023-11-23 MED ORDER — ESCITALOPRAM OXALATE 20 MG PO TABS
20.0000 mg | ORAL_TABLET | Freq: Every day | ORAL | 0 refills | Status: DC
  Filled 2023-11-23: qty 90, 90d supply, fill #0

## 2023-11-23 MED ORDER — AMLODIPINE BESYLATE 5 MG PO TABS
5.0000 mg | ORAL_TABLET | Freq: Every day | ORAL | 1 refills | Status: DC
  Filled 2023-11-23: qty 90, 90d supply, fill #0

## 2023-11-25 ENCOUNTER — Other Ambulatory Visit (HOSPITAL_COMMUNITY): Payer: Self-pay

## 2023-11-29 ENCOUNTER — Other Ambulatory Visit (HOSPITAL_COMMUNITY): Payer: Self-pay

## 2023-12-08 ENCOUNTER — Other Ambulatory Visit (HOSPITAL_COMMUNITY): Payer: Self-pay

## 2023-12-13 ENCOUNTER — Other Ambulatory Visit: Payer: Self-pay

## 2023-12-15 ENCOUNTER — Other Ambulatory Visit: Payer: Self-pay

## 2023-12-15 NOTE — Progress Notes (Signed)
 Specialty Pharmacy Ongoing Clinical Assessment Note  Maria Wiley is a 48 y.o. female who is being followed by the specialty pharmacy service for RxSp Multiple Sclerosis   Patient's specialty medication(s) reviewed today: Ofatumumab  (Kesimpta )   Missed doses in the last 4 weeks: 0   Patient/Caregiver did not have any additional questions or concerns.   Therapeutic benefit summary: Patient is achieving benefit   Adverse events/side effects summary: No adverse events/side effects   Patient's therapy is appropriate to: Continue    Goals Addressed             This Visit's Progress    Slow Disease Progression   On track    Patient is on track. Patient will maintain adherence         Follow up: 6 months  [facility] Specialty Pharmacist

## 2023-12-15 NOTE — Progress Notes (Signed)
 Specialty Pharmacy Refill Coordination Note  Maria Wiley is a 48 y.o. female contacted today regarding refills of specialty medication(s) Ofatumumab  (Kesimpta )   Patient requested Delivery   Delivery date: 12/16/23   Verified address: [address] [place], [place] [address]   Medication will be filled on 12/15/23.

## 2023-12-20 ENCOUNTER — Encounter: Payer: Self-pay | Admitting: Internal Medicine

## 2023-12-20 ENCOUNTER — Other Ambulatory Visit (HOSPITAL_COMMUNITY): Payer: Self-pay

## 2023-12-20 ENCOUNTER — Other Ambulatory Visit: Payer: Self-pay

## 2023-12-20 ENCOUNTER — Ambulatory Visit (INDEPENDENT_AMBULATORY_CARE_PROVIDER_SITE_OTHER): Admitting: Internal Medicine

## 2023-12-20 VITALS — BP 112/80 | HR 86 | Temp 98.8°F | Ht 67.0 in | Wt 216.0 lb

## 2023-12-20 DIAGNOSIS — G35 Multiple sclerosis: Secondary | ICD-10-CM

## 2023-12-20 DIAGNOSIS — Z0000 Encounter for general adult medical examination without abnormal findings: Secondary | ICD-10-CM

## 2023-12-20 DIAGNOSIS — I10 Essential (primary) hypertension: Secondary | ICD-10-CM

## 2023-12-20 DIAGNOSIS — M0600 Rheumatoid arthritis without rheumatoid factor, unspecified site: Secondary | ICD-10-CM | POA: Diagnosis not present

## 2023-12-20 DIAGNOSIS — G4700 Insomnia, unspecified: Secondary | ICD-10-CM | POA: Diagnosis not present

## 2023-12-20 DIAGNOSIS — E538 Deficiency of other specified B group vitamins: Secondary | ICD-10-CM | POA: Diagnosis not present

## 2023-12-20 DIAGNOSIS — D649 Anemia, unspecified: Secondary | ICD-10-CM

## 2023-12-20 DIAGNOSIS — E039 Hypothyroidism, unspecified: Secondary | ICD-10-CM | POA: Diagnosis not present

## 2023-12-20 DIAGNOSIS — M459 Ankylosing spondylitis of unspecified sites in spine: Secondary | ICD-10-CM

## 2023-12-20 LAB — CBC WITH DIFFERENTIAL/PLATELET
Basophils Absolute: 0.1 10*3/uL (ref 0.0–0.1)
Basophils Relative: 1 % (ref 0.0–3.0)
Eosinophils Absolute: 0.2 10*3/uL (ref 0.0–0.7)
Eosinophils Relative: 4.8 % (ref 0.0–5.0)
HCT: 33.8 % — ABNORMAL LOW (ref 36.0–46.0)
Hemoglobin: 11.2 g/dL — ABNORMAL LOW (ref 12.0–15.0)
Lymphocytes Relative: 39.2 % (ref 12.0–46.0)
Lymphs Abs: 2 10*3/uL (ref 0.7–4.0)
MCHC: 33.1 g/dL (ref 30.0–36.0)
MCV: 84.2 fl (ref 78.0–100.0)
Monocytes Absolute: 0.6 10*3/uL (ref 0.1–1.0)
Monocytes Relative: 11.7 % (ref 3.0–12.0)
Neutro Abs: 2.2 10*3/uL (ref 1.4–7.7)
Neutrophils Relative %: 43.3 % (ref 43.0–77.0)
Platelets: 240 10*3/uL (ref 150.0–400.0)
RBC: 4.01 Mil/uL (ref 3.87–5.11)
RDW: 13.9 % (ref 11.5–15.5)
WBC: 5 10*3/uL (ref 4.0–10.5)

## 2023-12-20 LAB — COMPREHENSIVE METABOLIC PANEL WITH GFR
ALT: 8 U/L (ref 0–35)
AST: 16 U/L (ref 0–37)
Albumin: 4.1 g/dL (ref 3.5–5.2)
Alkaline Phosphatase: 48 U/L (ref 39–117)
BUN: 15 mg/dL (ref 6–23)
CO2: 29 meq/L (ref 19–32)
Calcium: 9 mg/dL (ref 8.4–10.5)
Chloride: 101 meq/L (ref 96–112)
Creatinine, Ser: 0.93 mg/dL (ref 0.40–1.20)
GFR: 72.3 mL/min (ref >60.00)
Glucose, Bld: 70 mg/dL (ref 70–99)
Potassium: 4.1 meq/L (ref 3.5–5.1)
Sodium: 137 meq/L (ref 135–145)
Total Bilirubin: 0.4 mg/dL (ref 0.2–1.2)
Total Protein: 7.1 g/dL (ref 6.0–8.3)

## 2023-12-20 MED ORDER — VALACYCLOVIR HCL 1 G PO TABS
1000.0000 mg | ORAL_TABLET | Freq: Two times a day (BID) | ORAL | 3 refills | Status: AC
  Filled 2023-12-20: qty 180, 90d supply, fill #0
  Filled 2024-05-23: qty 180, 90d supply, fill #1

## 2023-12-20 MED ORDER — GABAPENTIN 300 MG PO CAPS
600.0000 mg | ORAL_CAPSULE | Freq: Every day | ORAL | 3 refills | Status: AC
  Filled 2023-12-21: qty 180, 90d supply, fill #0
  Filled 2024-03-16 – 2024-09-04 (×12): qty 180, 90d supply, fill #1

## 2023-12-20 MED ORDER — MAGNESIUM 250 MG PO TABS
250.0000 mg | ORAL_TABLET | Freq: Every day | ORAL | 3 refills | Status: AC
  Filled 2023-12-20 – 2024-08-07 (×5): qty 90, 90d supply, fill #0

## 2023-12-20 MED ORDER — LEVOTHYROXINE SODIUM 25 MCG PO TABS
25.0000 ug | ORAL_TABLET | Freq: Every day | ORAL | 3 refills | Status: AC
  Filled 2023-12-20 – 2024-02-22 (×3): qty 90, 90d supply, fill #0
  Filled 2024-05-23: qty 90, 90d supply, fill #1
  Filled 2024-08-07 – 2024-09-04 (×2): qty 90, 90d supply, fill #2

## 2023-12-20 MED ORDER — SOLIFENACIN SUCCINATE 10 MG PO TABS
10.0000 mg | ORAL_TABLET | Freq: Every day | ORAL | 3 refills | Status: AC
  Filled 2023-12-20 – 2024-02-14 (×2): qty 90, 90d supply, fill #0
  Filled 2024-05-15: qty 90, 90d supply, fill #1
  Filled 2024-08-07: qty 90, 90d supply, fill #2

## 2023-12-20 MED ORDER — AMLODIPINE BESYLATE 5 MG PO TABS
5.0000 mg | ORAL_TABLET | Freq: Every day | ORAL | 3 refills | Status: AC
  Filled 2023-12-20 – 2024-02-22 (×3): qty 90, 90d supply, fill #0
  Filled 2024-05-18: qty 90, 90d supply, fill #1

## 2023-12-20 MED ORDER — ESCITALOPRAM OXALATE 20 MG PO TABS
20.0000 mg | ORAL_TABLET | Freq: Every day | ORAL | 3 refills | Status: AC
  Filled 2023-12-20 – 2024-02-22 (×3): qty 90, 90d supply, fill #0
  Filled 2024-05-23: qty 90, 90d supply, fill #1
  Filled 2024-08-07: qty 90, 90d supply, fill #2

## 2023-12-20 MED ORDER — TRAZODONE HCL 50 MG PO TABS
100.0000 mg | ORAL_TABLET | Freq: Every day | ORAL | 3 refills | Status: AC
  Filled 2023-12-20 – 2024-02-22 (×2): qty 180, 90d supply, fill #0
  Filled 2024-05-23: qty 180, 90d supply, fill #1
  Filled 2024-08-07: qty 180, 90d supply, fill #2

## 2023-12-20 NOTE — Assessment & Plan Note (Signed)
 Taking kesimpta  and sulfasalazine . No flare today.

## 2023-12-20 NOTE — Assessment & Plan Note (Signed)
 Using trazodone and refilled.

## 2023-12-20 NOTE — Assessment & Plan Note (Signed)
 Checking CBC and ferritin given lower levels last labs. No GI bleeding and s/p hysterectomy.

## 2023-12-20 NOTE — Assessment & Plan Note (Signed)
Checking B12 and adjust as needed. 

## 2023-12-20 NOTE — Assessment & Plan Note (Signed)
Checking TSH and adjust levothyroxine as needed.

## 2023-12-20 NOTE — Progress Notes (Signed)
   Subjective:   Patient ID: Maria Wiley, female    DOB: Dec 18, 1975, 48 y.o.   MRN: [id number]  HPI The patient is here for physical.  PMH, [facility], social history reviewed and updated  Review of Systems  Constitutional: Negative.   HENT: Negative.    Eyes: Negative.   Respiratory:  Negative for cough, chest tightness and shortness of breath.   Cardiovascular:  Negative for chest pain, palpitations and leg swelling.  Gastrointestinal:  Negative for abdominal distention, abdominal pain, constipation, diarrhea, nausea and vomiting.  Musculoskeletal: Negative.   Skin:  Positive for rash.  Neurological: Negative.   Psychiatric/Behavioral: Negative.      Objective:  Physical Exam Constitutional:      Appearance: She is well-developed.  HENT:     Head: Normocephalic and atraumatic.  Cardiovascular:     Rate and Rhythm: Normal rate and regular rhythm.  Pulmonary:     Effort: Pulmonary effort is normal. No respiratory distress.     Breath sounds: Normal breath sounds. No wheezing or rales.  Abdominal:     General: Bowel sounds are normal. There is no distension.     Palpations: Abdomen is soft.     Tenderness: There is no abdominal tenderness. There is no rebound.  Musculoskeletal:     Cervical back: Normal range of motion.  Skin:    General: Skin is warm and dry.     Findings: Rash present.     Comments: Fine lacy rash on the legs without itching or pain  Neurological:     Mental Status: She is alert and oriented to person, place, and time.     Coordination: Coordination normal.     Vitals:   12/20/23 0828  BP: 112/80  Pulse: 86  Temp: 98.8 F (37.1 C)  TempSrc: Oral  SpO2: 98%  Weight: 216 lb (98 kg)  Height: 5\' 7"  (1.702 m)    Assessment & Plan:

## 2023-12-20 NOTE — Assessment & Plan Note (Signed)
 Taking kesimpta  and sulfasalazine . Checking CBC and CMP.

## 2023-12-20 NOTE — Assessment & Plan Note (Signed)
 Flu shot yearly. Pneumonia up to date. Shingrix due at 49. Tetanus up to date. Colonoscopy due 2026/2027. Mammogram up to date, pap smear not indicated. Counseled about sun safety and mole surveillance. Counseled about the dangers of distracted driving. Given 10 year screening recommendations.

## 2023-12-20 NOTE — Assessment & Plan Note (Signed)
 BP at goal checking CMP. Continue amlodipine  and hydrochlorothiazide .

## 2023-12-20 NOTE — Assessment & Plan Note (Signed)
 Taking kesimpta  and checking CBC and CMP.

## 2023-12-21 ENCOUNTER — Other Ambulatory Visit (HOSPITAL_COMMUNITY): Payer: Self-pay

## 2023-12-21 DIAGNOSIS — M48061 Spinal stenosis, lumbar region without neurogenic claudication: Secondary | ICD-10-CM | POA: Diagnosis not present

## 2023-12-21 DIAGNOSIS — G35 Multiple sclerosis: Secondary | ICD-10-CM | POA: Diagnosis not present

## 2023-12-21 DIAGNOSIS — M47816 Spondylosis without myelopathy or radiculopathy, lumbar region: Secondary | ICD-10-CM | POA: Diagnosis not present

## 2023-12-21 LAB — FERRITIN: Ferritin: 70.4 ng/mL (ref 10.0–291.0)

## 2023-12-21 LAB — TSH: TSH: 1.81 u[IU]/mL (ref 0.35–5.50)

## 2023-12-21 LAB — VITAMIN D 25 HYDROXY (VIT D DEFICIENCY, FRACTURES): VITD: 43.87 ng/mL (ref 30.00–100.00)

## 2023-12-21 LAB — VITAMIN B12: Vitamin B-12: 1066 pg/mL — ABNORMAL HIGH (ref 211–911)

## 2023-12-24 ENCOUNTER — Ambulatory Visit: Payer: Self-pay | Admitting: Internal Medicine

## 2023-12-31 ENCOUNTER — Telehealth: Payer: Self-pay | Admitting: Pharmacist

## 2023-12-31 NOTE — Telephone Encounter (Signed)
 Called patient to schedule an appointment for the [facility] [employer] [facility]. I was unable to reach the patient so I left a HIPAA-compliant message requesting that the patient return my call.   Butch Penny, PharmD, Patsy [place], CPP Clinical Pharmacist [facility] [phone]

## 2024-01-03 ENCOUNTER — Other Ambulatory Visit (HOSPITAL_COMMUNITY): Payer: Self-pay

## 2024-01-03 ENCOUNTER — Other Ambulatory Visit: Payer: Self-pay

## 2024-01-06 DIAGNOSIS — M450 Ankylosing spondylitis of multiple sites in spine: Secondary | ICD-10-CM | POA: Diagnosis not present

## 2024-01-06 DIAGNOSIS — Z6833 Body mass index (BMI) 33.0-33.9, adult: Secondary | ICD-10-CM | POA: Diagnosis not present

## 2024-01-06 DIAGNOSIS — G35 Multiple sclerosis: Secondary | ICD-10-CM | POA: Diagnosis not present

## 2024-01-06 DIAGNOSIS — E669 Obesity, unspecified: Secondary | ICD-10-CM | POA: Diagnosis not present

## 2024-01-06 DIAGNOSIS — Z79899 Other long term (current) drug therapy: Secondary | ICD-10-CM | POA: Diagnosis not present

## 2024-01-06 DIAGNOSIS — R5383 Other fatigue: Secondary | ICD-10-CM | POA: Diagnosis not present

## 2024-01-06 DIAGNOSIS — M0609 Rheumatoid arthritis without rheumatoid factor, multiple sites: Secondary | ICD-10-CM | POA: Diagnosis not present

## 2024-01-06 DIAGNOSIS — R21 Rash and other nonspecific skin eruption: Secondary | ICD-10-CM | POA: Diagnosis not present

## 2024-01-06 DIAGNOSIS — Z111 Encounter for screening for respiratory tuberculosis: Secondary | ICD-10-CM | POA: Diagnosis not present

## 2024-01-14 ENCOUNTER — Encounter (INDEPENDENT_AMBULATORY_CARE_PROVIDER_SITE_OTHER): Payer: Self-pay

## 2024-01-14 ENCOUNTER — Other Ambulatory Visit: Payer: Self-pay

## 2024-01-14 ENCOUNTER — Other Ambulatory Visit: Payer: Self-pay | Admitting: Internal Medicine

## 2024-01-14 ENCOUNTER — Other Ambulatory Visit (HOSPITAL_COMMUNITY): Payer: Self-pay

## 2024-01-17 ENCOUNTER — Other Ambulatory Visit: Payer: Self-pay

## 2024-01-17 NOTE — Progress Notes (Signed)
 This fill date is pending response to refill request from provider. Patient is aware and if they have not received fill by intended date they must follow up with pharmacy.   Intended delivery date of 01/21/24

## 2024-01-18 ENCOUNTER — Other Ambulatory Visit: Payer: Self-pay

## 2024-01-24 ENCOUNTER — Other Ambulatory Visit (HOSPITAL_COMMUNITY): Payer: Self-pay

## 2024-01-25 ENCOUNTER — Other Ambulatory Visit (HOSPITAL_COMMUNITY): Payer: Self-pay

## 2024-01-25 ENCOUNTER — Other Ambulatory Visit: Payer: Self-pay | Admitting: Internal Medicine

## 2024-01-26 ENCOUNTER — Other Ambulatory Visit (HOSPITAL_COMMUNITY): Payer: Self-pay

## 2024-01-26 ENCOUNTER — Other Ambulatory Visit: Payer: Self-pay

## 2024-01-26 MED ORDER — SULFASALAZINE 500 MG PO TABS
1000.0000 mg | ORAL_TABLET | Freq: Two times a day (BID) | ORAL | 0 refills | Status: DC
  Filled 2024-01-26 – 2024-02-02 (×2): qty 120, 30d supply, fill #0

## 2024-01-27 ENCOUNTER — Encounter: Payer: Self-pay | Admitting: Pharmacist

## 2024-01-27 ENCOUNTER — Ambulatory Visit: Attending: Internal Medicine | Admitting: Pharmacist

## 2024-01-27 ENCOUNTER — Other Ambulatory Visit: Payer: Self-pay

## 2024-01-27 ENCOUNTER — Other Ambulatory Visit (HOSPITAL_COMMUNITY): Payer: Self-pay

## 2024-01-27 DIAGNOSIS — Z79899 Other long term (current) drug therapy: Secondary | ICD-10-CM

## 2024-01-27 MED ORDER — KESIMPTA 20 MG/0.4ML ~~LOC~~ SOAJ
SUBCUTANEOUS | 11 refills | Status: AC
  Filled 2024-01-27: qty 0.4, 28d supply, fill #0
  Filled 2024-02-14: qty 0.4, 28d supply, fill #1
  Filled 2024-03-14: qty 0.4, 28d supply, fill #2
  Filled 2024-04-17: qty 0.4, 28d supply, fill #3
  Filled 2024-05-16: qty 0.4, 28d supply, fill #4
  Filled 2024-06-12: qty 0.4, 28d supply, fill #5
  Filled 2024-07-11: qty 0.4, 28d supply, fill #6
  Filled 2024-08-07: qty 0.4, 28d supply, fill #7

## 2024-01-27 MED ORDER — KESIMPTA 20 MG/0.4ML ~~LOC~~ SOAJ: SUBCUTANEOUS | 11 refills | Status: DC

## 2024-01-27 NOTE — Progress Notes (Signed)
   S: Patient presents today for review of their specialty medication.   Patient is taking Kesimpta  for MS. Patient is managed by Dr. Abou Zeid for this.   Dosing: 20 mg subcutaneous at weeks 0, 1, and 2 followed by once monthly starting at week 4  Adherence: confirms   Efficacy: reports that the medication is working well for her.   Monitoring:  CBC (monitor for neutropenia, anemia): has a hx of stable B12 deficiency anemia but recent blood counts are stable  Liver function tests: wnl (12/20/23) S/sx of infection: none S/sx of UTI: none Pregnancy: none  Current adverse effects: Injection site reactions: none GI symptoms (diarrhea, nausea): none Headache: none   O:     Lab Results  Component Value Date   WBC 5.0 12/20/2023   HGB 11.2 (L) 12/20/2023   HCT 33.8 (L) 12/20/2023   MCV 84.2 12/20/2023   PLT 240.0 12/20/2023      Chemistry      Component Value Date/Time   NA 137 12/20/2023 0859   K 4.1 12/20/2023 0859   CL 101 12/20/2023 0859   CO2 29 12/20/2023 0859   BUN 15 12/20/2023 0859   CREATININE 0.93 12/20/2023 0859      Component Value Date/Time   CALCIUM 9.0 12/20/2023 0859   ALKPHOS 48 12/20/2023 0859   AST 16 12/20/2023 0859   ALT 8 12/20/2023 0859   BILITOT 0.4 12/20/2023 0859       A/P: 1. Medication review: patient is taking Kesimpta  for MS. Reviewed the medication with her including the following: Ofatumumab  is a recombinant human monoclonal IgG1 antibody that binds to human CD20 expressed on B-cells. The precise mechanism by which ofatumumab  exerts in therapeutic effects in multiple sclerosis is unknown, but presumed to involve binding to CD20, a cell surface antigen present on pre-B and mature B lymphocytes. Following cell surface binding to B lymphocytes, ofatumumab  results in antibody-dependent cellular cytolysis and complement-mediated lysis. It is supplied as a Sensoready Pen. It should be kept at refrigerated temperature and the pen should be  removed to allow to reach room temperature for about 15-30 minutes. It is injected subcutaneously and should be administered in the abdomen, thigh, or outer upper arm. Common adverse effects include injection site reactions, GI side effects, infection, and headache. Patients should be screened for HepB prior to use and liver function tests as well as blood counts should be monitored throughout therapy. No recommendation for any changes at this time.   Herlene Fleeta Morris, PharmD, JAQUELINE, CPP Clinical Pharmacist [facility] [phone]

## 2024-02-02 ENCOUNTER — Other Ambulatory Visit: Payer: Self-pay

## 2024-02-02 ENCOUNTER — Other Ambulatory Visit (HOSPITAL_COMMUNITY): Payer: Self-pay

## 2024-02-14 ENCOUNTER — Other Ambulatory Visit: Payer: Self-pay

## 2024-02-14 ENCOUNTER — Encounter (INDEPENDENT_AMBULATORY_CARE_PROVIDER_SITE_OTHER): Payer: Self-pay

## 2024-02-14 ENCOUNTER — Other Ambulatory Visit (HOSPITAL_COMMUNITY): Payer: Self-pay

## 2024-02-14 NOTE — Progress Notes (Signed)
 Specialty Pharmacy Refill Coordination Note  MyChart Questionnaire Submission  Maria Wiley is a 48 y.o. female contacted today regarding refills of specialty medication(s) Kesimpta .  Injection date: (Patient-Rptd) 02/24/24    Patient requested: (Patient-Rptd) Delivery   Delivery date: 02/18/24   Verified address: (Patient-Rptd) [address] [place], [place] [address]  Medication will be filled on 02/17/24.

## 2024-02-17 ENCOUNTER — Other Ambulatory Visit (HOSPITAL_COMMUNITY): Payer: Self-pay

## 2024-02-17 ENCOUNTER — Other Ambulatory Visit: Payer: Self-pay

## 2024-02-22 ENCOUNTER — Other Ambulatory Visit: Payer: Self-pay

## 2024-02-22 DIAGNOSIS — G35 Multiple sclerosis: Secondary | ICD-10-CM | POA: Diagnosis not present

## 2024-02-22 DIAGNOSIS — H0288B Meibomian gland dysfunction left eye, upper and lower eyelids: Secondary | ICD-10-CM | POA: Diagnosis not present

## 2024-02-22 DIAGNOSIS — H1045 Other chronic allergic conjunctivitis: Secondary | ICD-10-CM | POA: Diagnosis not present

## 2024-02-22 DIAGNOSIS — H04123 Dry eye syndrome of bilateral lacrimal glands: Secondary | ICD-10-CM | POA: Diagnosis not present

## 2024-02-22 DIAGNOSIS — H0288A Meibomian gland dysfunction right eye, upper and lower eyelids: Secondary | ICD-10-CM | POA: Diagnosis not present

## 2024-02-23 ENCOUNTER — Other Ambulatory Visit (HOSPITAL_COMMUNITY): Payer: Self-pay

## 2024-02-24 ENCOUNTER — Other Ambulatory Visit (HOSPITAL_COMMUNITY): Payer: Self-pay

## 2024-02-25 ENCOUNTER — Other Ambulatory Visit (HOSPITAL_COMMUNITY): Payer: Self-pay

## 2024-02-25 MED ORDER — SULFASALAZINE 500 MG PO TABS
1000.0000 mg | ORAL_TABLET | Freq: Two times a day (BID) | ORAL | 0 refills | Status: DC
  Filled 2024-02-25 – 2024-02-28 (×2): qty 120, 30d supply, fill #0

## 2024-02-28 ENCOUNTER — Other Ambulatory Visit: Payer: Self-pay

## 2024-02-28 ENCOUNTER — Other Ambulatory Visit (HOSPITAL_COMMUNITY): Payer: Self-pay

## 2024-03-02 ENCOUNTER — Other Ambulatory Visit (HOSPITAL_COMMUNITY): Payer: Self-pay

## 2024-03-02 DIAGNOSIS — L209 Atopic dermatitis, unspecified: Secondary | ICD-10-CM | POA: Diagnosis not present

## 2024-03-02 DIAGNOSIS — L959 Vasculitis limited to the skin, unspecified: Secondary | ICD-10-CM | POA: Diagnosis not present

## 2024-03-02 MED ORDER — PIMECROLIMUS 1 % EX CREA
1.0000 | TOPICAL_CREAM | Freq: Two times a day (BID) | CUTANEOUS | 2 refills | Status: AC
  Filled 2024-03-02: qty 30, 30d supply, fill #0
  Filled 2024-04-24: qty 30, 30d supply, fill #1
  Filled 2024-05-23: qty 30, 30d supply, fill #2

## 2024-03-08 ENCOUNTER — Other Ambulatory Visit (HOSPITAL_COMMUNITY): Payer: Self-pay

## 2024-03-08 MED ORDER — GABAPENTIN 300 MG PO CAPS
900.0000 mg | ORAL_CAPSULE | Freq: Every day | ORAL | 3 refills | Status: AC
  Filled 2024-03-08: qty 270, 90d supply, fill #0
  Filled 2024-06-05: qty 270, 90d supply, fill #1
  Filled 2024-08-07 – 2024-09-04 (×2): qty 270, 90d supply, fill #2

## 2024-03-09 ENCOUNTER — Other Ambulatory Visit: Payer: Self-pay

## 2024-03-13 DIAGNOSIS — M5459 Other low back pain: Secondary | ICD-10-CM | POA: Diagnosis not present

## 2024-03-13 DIAGNOSIS — M545 Low back pain, unspecified: Secondary | ICD-10-CM | POA: Diagnosis not present

## 2024-03-13 DIAGNOSIS — G8929 Other chronic pain: Secondary | ICD-10-CM | POA: Diagnosis not present

## 2024-03-13 DIAGNOSIS — M47817 Spondylosis without myelopathy or radiculopathy, lumbosacral region: Secondary | ICD-10-CM | POA: Diagnosis not present

## 2024-03-14 ENCOUNTER — Other Ambulatory Visit (INDEPENDENT_AMBULATORY_CARE_PROVIDER_SITE_OTHER): Payer: Self-pay

## 2024-03-14 ENCOUNTER — Encounter: Payer: Self-pay | Admitting: Orthopaedic Surgery

## 2024-03-14 ENCOUNTER — Other Ambulatory Visit: Payer: Self-pay | Admitting: Pharmacy Technician

## 2024-03-14 ENCOUNTER — Ambulatory Visit (INDEPENDENT_AMBULATORY_CARE_PROVIDER_SITE_OTHER): Admitting: Orthopaedic Surgery

## 2024-03-14 ENCOUNTER — Other Ambulatory Visit: Payer: Self-pay

## 2024-03-14 DIAGNOSIS — M25511 Pain in right shoulder: Secondary | ICD-10-CM

## 2024-03-14 NOTE — Progress Notes (Signed)
 Specialty Pharmacy Refill Coordination Note  Maria Wiley is a 48 y.o. female contacted today regarding refills of specialty medication(s) Ofatumumab  (Kesimpta )   Patient requested Delivery   Delivery date: 03/21/24   Verified address: [address]  [place]   Medication will be filled on 03/22/24.

## 2024-03-14 NOTE — Addendum Note (Signed)
 Addended by: Kaysea Raya on: 03/14/2024 12:39 PM   Modules accepted: Orders

## 2024-03-14 NOTE — Progress Notes (Signed)
 Office Visit Note   Patient: Maria Wiley           Date of Birth: 01-Apr-1976           MRN: [id number] Visit Date: 03/14/2024              Requested by: Rollene Almarie LABOR, MD [address] [place],  [place] [address] PCP: Rollene Almarie LABOR, MD   Assessment & Plan: Visit Diagnoses:  1. Acute pain of right shoulder     Plan: History of Present Illness Maria Wiley is a 48 year old female with rheumatoid arthritis and multiple sclerosis who presents with persistent right shoulder pain.  She has experienced persistent right shoulder pain for over a year. A subacromial injection a year ago provided relief for six weeks before the pain returned. The pain is particularly bothersome at night, often waking her due to throbbing sensations. She avoids sleeping on the affected shoulder and props her arm while lying on her back, but the pain persists. She takes diclofenac  twice daily for back pain, but it does not alleviate the shoulder pain. There are no new injuries to the shoulder.  Physical Exam MUSCULOSKELETAL: Shoulder flexibility is painful at extremes with good rotator cuff strength.  Negative Spurling's.    Results RADIOLOGY Shoulder X-ray: Structurally normal, osteophyte in AC joint, hooked acromion, no fracture or dislocation.  Assessment and Plan Chronic right shoulder pain Chronic pain with nocturnal exacerbation, unresponsive to diclofenac . X-ray shows AC joint bone spur and hooked acromion, indicating possible impingement. Differential includes partial rotator cuff tendinopathy and/or adhesive capsulitis. MRI needed for soft tissue evaluation. - Order MRI of the right shoulder to assess soft tissue structures. - Continue diclofenac  as currently prescribed. - Re-evaluate treatment options after MRI results.  Follow-Up Instructions: No follow-ups on file.   Orders:  Orders Placed This Encounter  Procedures   XR Shoulder Right   No orders of the  defined types were placed in this encounter.     Procedures: No procedures performed   Clinical Data: No additional findings.   Subjective: Chief Complaint  Patient presents with   Right Shoulder - Pain    HPI  Review of Systems  Constitutional: Negative.   HENT: Negative.    Eyes: Negative.   Respiratory: Negative.    Cardiovascular: Negative.   Endocrine: Negative.   Musculoskeletal: Negative.   Neurological: Negative.   Hematological: Negative.   Psychiatric/Behavioral: Negative.    All other systems reviewed and are negative.    Objective: Vital Signs: There were no vitals taken for this visit.  Physical Exam Vitals and nursing note reviewed.  Constitutional:      Appearance: She is well-developed.  HENT:     Head: Atraumatic.     Nose: Nose normal.  Eyes:     Extraocular Movements: Extraocular movements intact.  Cardiovascular:     Pulses: Normal pulses.  Pulmonary:     Effort: Pulmonary effort is normal.  Abdominal:     Palpations: Abdomen is soft.  Musculoskeletal:     Cervical back: Neck supple.  Skin:    General: Skin is warm.     Capillary Refill: Capillary refill takes less than 2 seconds.  Neurological:     Mental Status: She is alert. Mental status is at baseline.  Psychiatric:        Behavior: Behavior normal.        Thought Content: Thought content normal.  Judgment: Judgment normal.     Ortho Exam  Specialty Comments:  No specialty comments available.  Imaging: XR Shoulder Right Result Date: 03/14/2024 3 view xrays show no acute or structural abnormalities    PMFS History: Patient Active Problem List   Diagnosis Date Noted   Hashimoto thyroiditis 10/19/2023   Anemia 10/19/2023   Dysphagia 05/06/2023   Hot flashes 03/24/2023   Insomnia 09/30/2022   Migraines 06/09/2022   Multiple sclerosis (HCC) 05/11/2022   Vision loss 09/26/2021   B12 deficiency 09/26/2021   Obesity 08/20/2021   Numbness and tingling  02/07/2021   Chronic migraine without aura without status migrainosus, not intractable 05/10/2019   Allergic rhinitis 05/08/2019   Essential hypertension 03/10/2019   Attention and concentration deficit 01/24/2019   Ankylosing spondylitis (HCC) 01/15/2018   Routine general medical examination at a health care facility 01/15/2018   IBS (irritable bowel syndrome) 01/15/2018   RA (rheumatoid arthritis) (HCC)    Hypothyroidism    Past Medical History:  Diagnosis Date   Anemia    I been anemic for many years   Ankylosing spondylitis (HCC)    Anxiety    Since 2016   Chest pain    COVID-19 06/2020   GERD (gastroesophageal reflux disease)    Hypertension    Migraines    Multiple sclerosis (HCC)    RA (rheumatoid arthritis) (HCC)    Thyroid  disease    Urinary incontinence     Family History  Problem Relation Age of Onset   Hypertension Mother    Arthritis Mother    Irritable bowel syndrome Mother    Arthritis Maternal Grandmother    Heart disease Maternal Grandmother    Hyperlipidemia Maternal Grandmother    CAD Maternal Grandfather    Diabetes Maternal Grandfather    Hearing loss Maternal Grandfather    Heart disease Maternal Grandfather    Hyperlipidemia Maternal Grandfather    Diabetes Son    Migraines Neg Hx    Headache Neg Hx    Breast cancer Neg Hx    Colon cancer Neg Hx    Esophageal cancer Neg Hx     Past Surgical History:  Procedure Laterality Date   ABDOMINAL HYSTERECTOMY     APPENDECTOMY     CHOLECYSTECTOMY     HERNIA REPAIR  2004   KNEE SURGERY Right 2006   TUBAL LIGATION  2012   Social History   Occupational History   Not on file  Tobacco Use   Smoking status: Never   Smokeless tobacco: Never  Vaping Use   Vaping status: Never Used  Substance and Sexual Activity   Alcohol use: Never   Drug use: Never   Sexual activity: Yes    Birth control/protection: Surgical

## 2024-03-16 ENCOUNTER — Other Ambulatory Visit (HOSPITAL_COMMUNITY): Payer: Self-pay

## 2024-03-21 ENCOUNTER — Other Ambulatory Visit: Payer: Self-pay

## 2024-03-22 ENCOUNTER — Other Ambulatory Visit (HOSPITAL_COMMUNITY): Payer: Self-pay

## 2024-03-22 DIAGNOSIS — M47816 Spondylosis without myelopathy or radiculopathy, lumbar region: Secondary | ICD-10-CM | POA: Diagnosis not present

## 2024-03-22 MED ORDER — METHOCARBAMOL 500 MG PO TABS
500.0000 mg | ORAL_TABLET | Freq: Three times a day (TID) | ORAL | 0 refills | Status: AC
  Filled 2024-03-22: qty 40, 14d supply, fill #0

## 2024-03-23 ENCOUNTER — Encounter (HOSPITAL_COMMUNITY): Payer: Self-pay

## 2024-03-23 ENCOUNTER — Telehealth (HOSPITAL_COMMUNITY): Payer: Self-pay | Admitting: Emergency Medicine

## 2024-03-23 ENCOUNTER — Ambulatory Visit (HOSPITAL_COMMUNITY): Payer: Self-pay

## 2024-03-23 ENCOUNTER — Ambulatory Visit (HOSPITAL_COMMUNITY)
Admission: EM | Admit: 2024-03-23 | Discharge: 2024-03-23 | Disposition: A | Discharge status: Home / Self Care | Attending: Family Medicine | Admitting: Family Medicine

## 2024-03-23 ENCOUNTER — Ambulatory Visit (HOSPITAL_COMMUNITY)
Admission: RE | Admit: 2024-03-23 | Discharge: 2024-03-23 | Disposition: A | Discharge status: Home / Self Care | Source: Ambulatory Visit | Attending: Family Medicine | Admitting: Family Medicine

## 2024-03-23 DIAGNOSIS — M7989 Other specified soft tissue disorders: Secondary | ICD-10-CM | POA: Diagnosis not present

## 2024-03-23 DIAGNOSIS — M79661 Pain in right lower leg: Secondary | ICD-10-CM

## 2024-03-23 DIAGNOSIS — M25561 Pain in right knee: Secondary | ICD-10-CM | POA: Insufficient documentation

## 2024-03-23 NOTE — Discharge Instructions (Addendum)
 Vascular lab will notify us  if there is any abnormality on your Doppler.

## 2024-03-23 NOTE — Telephone Encounter (Signed)
 Per vascular lab, patient was negative for DVT study and was sent home. This RN will make ordering provider aware of test results.

## 2024-03-23 NOTE — ED Triage Notes (Signed)
 Pt states pain behind her right knee since yesterday.  Denies any injury.  States she took an aspirin  last night.

## 2024-03-23 NOTE — ED Provider Notes (Signed)
 [facility]    CSN: [id number] Arrival date & time: 03/23/24  0820      History   Chief Complaint Chief Complaint  Patient presents with   Knee Pain    HPI Maria Wiley is a 48 y.o. female.    Knee Pain Here for pain in her right proximal calf.  It began last night and is fairly intense.  No known injury or overuse.  No recent long trips.  She does have a history of rheumatoid arthritis and ankylosing spondylitis.  No shortness of breath.  She did take some aspirin  last night.  NKDA  She has had a hysterectomy. Past Medical History:  Diagnosis Date   Anemia    I been anemic for many years   Ankylosing spondylitis (HCC)    Anxiety    Since 2016   Chest pain    COVID-19 06/2020   GERD (gastroesophageal reflux disease)    Hypertension    Migraines    Multiple sclerosis (HCC)    RA (rheumatoid arthritis) (HCC)    Thyroid  disease    Urinary incontinence     Patient Active Problem List   Diagnosis Date Noted   Hashimoto thyroiditis 10/19/2023   Anemia 10/19/2023   Dysphagia 05/06/2023   Hot flashes 03/24/2023   Insomnia 09/30/2022   Migraines 06/09/2022   Multiple sclerosis (HCC) 05/11/2022   Vision loss 09/26/2021   B12 deficiency 09/26/2021   Obesity 08/20/2021   Numbness and tingling 02/07/2021   Chronic migraine without aura without status migrainosus, not intractable 05/10/2019   Allergic rhinitis 05/08/2019   Essential hypertension 03/10/2019   Attention and concentration deficit 01/24/2019   Ankylosing spondylitis (HCC) 01/15/2018   Routine general medical examination at a health care facility 01/15/2018   IBS (irritable bowel syndrome) 01/15/2018   RA (rheumatoid arthritis) (HCC)    Hypothyroidism     Past Surgical History:  Procedure Laterality Date   ABDOMINAL HYSTERECTOMY     APPENDECTOMY     CHOLECYSTECTOMY     HERNIA REPAIR  2004   KNEE SURGERY Right 2006   TUBAL LIGATION  2012    OB History   No obstetric  history on file.      Home Medications    Prior to Admission medications   Medication Sig Start Date End Date Taking? Authorizing Provider  amantadine  (SYMMETREL ) 100 MG capsule Take 1 capsule (100 mg total) by mouth 2 (two) times daily. 08/26/23     amLODipine  (NORVASC ) 5 MG tablet Take 1 tablet (5 mg total) by mouth daily. 12/20/23   Rollene Almarie LABOR, MD  aspirin  EC 81 MG tablet Take 1 tablet (81 mg total) by mouth daily. Swallow whole. 06/10/22   Gherghe, Costin M, MD  Cholecalciferol  (VITAMIN D3) 1000 units CAPS Take 1 tablet (1,000 Units) by mouth daily. 07/01/22   Rollene Almarie LABOR, MD  cyanocobalamin  (VITAMIN B12) 1000 MCG tablet Take 1 tablet (1,000 mcg total) by mouth daily. 10/22/22   Rollene Almarie LABOR, MD  cyclobenzaprine  (FLEXERIL ) 5 MG tablet Take 1 tablet (5 mg total) by mouth at bedtime as needed for muscle spasms. 01/22/23   Christopher Savannah, PA-C  diclofenac  (VOLTAREN ) 75 MG EC tablet Take 1 tablet (75 mg total) by mouth 2 (two) times daily for joint or back pain. 10/25/23     escitalopram  (LEXAPRO ) 20 MG tablet Take 1 tablet (20 mg total) by mouth daily. 12/20/23   Rollene Almarie LABOR, MD  estradiol  (CLIMARA  - DOSED IN MG/24  HR) 0.0375 mg/24hr patch Place 1 patch (0.0375 mg total) onto the skin once a week. 03/23/23   Rollene Almarie LABOR, MD  fluticasone  (FLONASE ) 50 MCG/ACT nasal spray Place 2 sprays into both nostrils daily. 09/20/23   Jha, Panav, MD  folic acid  (FOLVITE ) 1 MG tablet Take 1 tablet (1 mg total) by mouth daily. 01/14/18   Rollene Almarie LABOR, MD  gabapentin  (NEURONTIN ) 100 MG capsule Take 1 capsule (100 mg total) by mouth 2 (two) times daily as needed. 08/20/23     gabapentin  (NEURONTIN ) 300 MG capsule Take 2 capsules (600 mg total) by mouth at bedtime. 12/20/23     gabapentin  (NEURONTIN ) 300 MG capsule Take 3 capsules (900 mg total) by mouth at bedtime. 03/08/24   Nida Rosina SAUNDERS, NP  hydrochlorothiazide  (MICROZIDE ) 12.5 MG capsule Take 1 capsule (12.5 mg  total) by mouth daily. 10/19/23   Norleen Lynwood ORN, MD  levothyroxine  (SYNTHROID ) 25 MCG tablet Take 1 tablet (25 mcg total) by mouth daily before breakfast. 12/20/23 12/19/24  Rollene Almarie LABOR, MD  linaclotide  (LINZESS ) 145 MCG CAPS capsule Take by mouth. 04/09/22   [provider]  Magnesium  250 MG TABS Take 1 tablet (250 mg total) by mouth daily. 12/20/23   Rollene Almarie LABOR, MD  methocarbamol  (ROBAXIN ) 500 MG tablet Take 1 tablet (500 mg total) by mouth 3 (three) times a day as needed for muscle spasms. 03/22/24     Ofatumumab  (KESIMPTA ) 20 MG/0.4ML SOAJ Inject 0.4 mLs (20 mg total) into the skin every 28 days. 01/27/24   Jegede, Olugbemiga E, MD  pimecrolimus  (ELIDEL ) 1 % cream Apply 1 Application topically 2 (two) times daily. 03/02/24     solifenacin  (VESICARE ) 10 MG tablet Take 1 tablet (10 mg total) by mouth daily. 12/20/23   Rollene Almarie LABOR, MD  sulfaSALAzine  (AZULFIDINE ) 500 MG tablet Take 2 tablets (1,000 mg total) by mouth 2 (two) times daily. 02/25/24     traZODone  (DESYREL ) 50 MG tablet Take 2 tablets (100 mg total) by mouth at bedtime. 12/20/23   Rollene Almarie LABOR, MD  valACYclovir  (VALTREX ) 1000 MG tablet Take 1 tablet (1,000 mg total) by mouth 2 (two) times daily. 12/20/23   Rollene Almarie LABOR, MD    Family History Family History  Problem Relation Age of Onset   Hypertension Mother    Arthritis Mother    Irritable bowel syndrome Mother    Arthritis Maternal Grandmother    Heart disease Maternal Grandmother    Hyperlipidemia Maternal Grandmother    CAD Maternal Grandfather    Diabetes Maternal Grandfather    Hearing loss Maternal Grandfather    Heart disease Maternal Grandfather    Hyperlipidemia Maternal Grandfather    Diabetes Son    Migraines Neg Hx    Headache Neg Hx    Breast cancer Neg Hx    Colon cancer Neg Hx    Esophageal cancer Neg Hx     Social History Social History   Tobacco Use   Smoking status: Never   Smokeless tobacco: Never   Vaping Use   Vaping status: Never Used  Substance Use Topics   Alcohol use: Never   Drug use: Never     Allergies   Patient has no known allergies.   Review of Systems Review of Systems   Physical Exam Triage Vital Signs ED Triage Vitals  Encounter Vitals Group     BP 03/23/24 0900 121/77     Girls Systolic BP Percentile --  Girls Diastolic BP Percentile --      Boys Systolic BP Percentile --      Boys Diastolic BP Percentile --      Pulse Rate 03/23/24 0900 88     Resp 03/23/24 0900 16     Temp 03/23/24 0900 97.8 F (36.6 C)     Temp Source 03/23/24 0900 Oral     SpO2 03/23/24 0900 98 %     Weight --      Height --      Head Circumference --      Peak Flow --      Pain Score 03/23/24 0902 3     Pain Loc --      Pain Education --      Exclude from Growth Chart --    No data found.  Updated Vital Signs BP 121/77 (BP Location: Left Arm)   Pulse 88   Temp 97.8 F (36.6 C) (Oral)   Resp 16   SpO2 98%   Visual Acuity Right Eye Distance:   Left Eye Distance:   Bilateral Distance:    Right Eye Near:   Left Eye Near:    Bilateral Near:     Physical Exam Vitals reviewed.  Constitutional:      General: She is not in acute distress.    Appearance: She is not toxic-appearing.  Musculoskeletal:     Comments: Some tenderness of the right proximal calf near the popliteal fossa.  No erythema and no edema noted today.  Pulses are normal in that ankle and foot.  [place] is positive.  Skin:    Coloration: Skin is not jaundiced or pale.  Neurological:     General: No focal deficit present.     Mental Status: She is alert and oriented to person, place, and time.  Psychiatric:        Behavior: Behavior normal.      UC Treatments / Results  Labs (all labs ordered are listed, but only abnormal results are displayed) Labs Reviewed - No data to display  EKG   Radiology No results found.  Procedures Procedures (including critical care  time)  Medications Ordered in UC Medications - No data to display  Initial Impression / Assessment and Plan / UC Course  I have reviewed the triage vital signs and the nursing notes.  Pertinent labs & imaging results that were available during my care of the patient were reviewed by me and considered in my medical decision making (see chart for details).     Venous Doppler is ordered for today. Final Clinical Impressions(s) / UC Diagnoses   Final diagnoses:  Right calf pain     Discharge Instructions      Vascular lab will notify us  if there is any abnormality on your Doppler.     ED Prescriptions   None    PDMP not reviewed this encounter.   Vonna Sharlet POUR, MD 03/23/24 [phone]

## 2024-03-24 ENCOUNTER — Other Ambulatory Visit (HOSPITAL_COMMUNITY): Payer: Self-pay

## 2024-03-24 ENCOUNTER — Other Ambulatory Visit: Payer: Self-pay | Admitting: Internal Medicine

## 2024-03-24 MED ORDER — ESTRADIOL 0.0375 MG/24HR TD PTWK
0.0375 mg | MEDICATED_PATCH | TRANSDERMAL | 12 refills | Status: DC
  Filled 2024-03-24: qty 4, 28d supply, fill #0

## 2024-03-27 ENCOUNTER — Other Ambulatory Visit: Payer: Self-pay

## 2024-03-27 ENCOUNTER — Other Ambulatory Visit (HOSPITAL_COMMUNITY): Payer: Self-pay

## 2024-03-27 MED ORDER — SULFASALAZINE 500 MG PO TABS
1000.0000 mg | ORAL_TABLET | Freq: Two times a day (BID) | ORAL | 1 refills | Status: DC
  Filled 2024-03-27: qty 120, 30d supply, fill #0
  Filled 2024-04-24: qty 120, 30d supply, fill #1

## 2024-03-29 ENCOUNTER — Ambulatory Visit
Admission: RE | Admit: 2024-03-29 | Discharge: 2024-03-29 | Disposition: A | Discharge status: Home / Self Care | Source: Ambulatory Visit | Attending: Orthopaedic Surgery | Admitting: Orthopaedic Surgery

## 2024-03-29 DIAGNOSIS — M19011 Primary osteoarthritis, right shoulder: Secondary | ICD-10-CM | POA: Diagnosis not present

## 2024-03-29 DIAGNOSIS — M25511 Pain in right shoulder: Secondary | ICD-10-CM

## 2024-03-31 ENCOUNTER — Other Ambulatory Visit (HOSPITAL_COMMUNITY): Payer: Self-pay

## 2024-03-31 MED ORDER — DICLOFENAC SODIUM 75 MG PO TBEC
75.0000 mg | DELAYED_RELEASE_TABLET | Freq: Two times a day (BID) | ORAL | 0 refills | Status: DC | PRN
  Filled 2024-03-31: qty 60, 30d supply, fill #0

## 2024-04-05 ENCOUNTER — Ambulatory Visit (INDEPENDENT_AMBULATORY_CARE_PROVIDER_SITE_OTHER): Admitting: Orthopaedic Surgery

## 2024-04-05 ENCOUNTER — Other Ambulatory Visit: Payer: Self-pay

## 2024-04-05 ENCOUNTER — Encounter: Payer: Self-pay | Admitting: Sports Medicine

## 2024-04-05 ENCOUNTER — Ambulatory Visit (INDEPENDENT_AMBULATORY_CARE_PROVIDER_SITE_OTHER): Admitting: Sports Medicine

## 2024-04-05 DIAGNOSIS — M25511 Pain in right shoulder: Secondary | ICD-10-CM

## 2024-04-05 MED ORDER — LIDOCAINE HCL 1 % IJ SOLN
2.0000 mL | INTRAMUSCULAR | Status: AC | PRN
  Administered 2024-04-05: 2 mL

## 2024-04-05 MED ORDER — METHYLPREDNISOLONE ACETATE 40 MG/ML IJ SUSP
80.0000 mg | INTRAMUSCULAR | Status: AC | PRN
  Administered 2024-04-05: 80 mg via INTRA_ARTICULAR

## 2024-04-05 MED ORDER — BUPIVACAINE HCL 0.25 % IJ SOLN
2.0000 mL | INTRAMUSCULAR | Status: AC | PRN
  Administered 2024-04-05: 2 mL via INTRA_ARTICULAR

## 2024-04-05 NOTE — Progress Notes (Signed)
   Office Visit Note   Patient: Maria Wiley           Date of Birth: 1976/04/29           MRN: [id number] Visit Date: 04/05/2024              Requested by: Rollene Almarie LABOR, MD [address] [place],  [place] [address] PCP: Rollene Almarie LABOR, MD   Assessment & Plan: Visit Diagnoses:  1. Acute pain of right shoulder     Plan: History of Present Illness Iasia Forcier is a 48 year old female who presents with persistent right shoulder pain.  She experiences aching shoulder pain that occurs both at night and sometimes during the day. A subacromial injection previously provided relief for six weeks, but the pain returned after the effects wore off. Diclofenac  has not been effective in managing her symptoms. She has not participated in physical therapy previously.  Results RADIOLOGY Shoulder MRI: Acromioclavicular [facility]) joint arthritis, glenohumeral arthritis, rotator cuff tendinopathy  Assessment and Plan Right shoulder rotator cuff tendinopathy with glenohumeral and acromioclavicular osteoarthritis Chronic right shoulder pain from rotator cuff tendinopathy and mild osteoarthritis. Previous subacromial injection provided temporary relief. Diclofenac  ineffective. No prior physical therapy. Reviewed MRI images with patient. - Administer shoulder joint cortisone injection with ultrasound guidance with Dr. Burnetta. - Initiate outpatient physical therapy for six weeks, twice weekly, with home exercises. - Provide prescription for physical therapy at [facility].  Follow-Up Instructions: No follow-ups on file.    Subjective: Chief Complaint  Patient presents with   Right Shoulder - Pain, Follow-up    HPI  Review of Systems  Constitutional: Negative.   HENT: Negative.    Eyes: Negative.   Respiratory: Negative.    Cardiovascular: Negative.   Endocrine: Negative.   Musculoskeletal: Negative.   Neurological: Negative.   Hematological: Negative.    Psychiatric/Behavioral: Negative.    All other systems reviewed and are negative.    Objective: Vital Signs: There were no vitals taken for this visit.  Physical Exam Vitals and nursing note reviewed.  Constitutional:      Appearance: She is well-developed.  HENT:     Head: Atraumatic.     Nose: Nose normal.  Eyes:     Extraocular Movements: Extraocular movements intact.  Cardiovascular:     Pulses: Normal pulses.  Pulmonary:     Effort: Pulmonary effort is normal.  Abdominal:     Palpations: Abdomen is soft.  Musculoskeletal:     Cervical back: Neck supple.  Skin:    General: Skin is warm.     Capillary Refill: Capillary refill takes less than 2 seconds.  Neurological:     Mental Status: She is alert. Mental status is at baseline.  Psychiatric:        Behavior: Behavior normal.        Thought Content: Thought content normal.        Judgment: Judgment normal.

## 2024-04-05 NOTE — Progress Notes (Signed)
   Procedure Note  Patient: Maria Wiley             Date of Birth: Mar 05, 1976           MRN: [id number]             Visit Date: 04/05/2024  Procedures: Visit Diagnoses:  1. Right shoulder pain, unspecified chronicity    Large Joint Inj: R glenohumeral on 04/05/2024 9:13 AM Indications: pain Details: 22 G 3.5 in needle, ultrasound-guided posterior approach Medications: 2 mL lidocaine  1 %; 2 mL bupivacaine  0.25 %; 80 mg methylPREDNISolone  acetate 40 MG/ML Outcome: tolerated well, no immediate complications  US -guided glenohumeral joint injection, right shoulder After discussion on risks/benefits/indications, informed verbal consent was obtained. A timeout was then performed. The patient was positioned lying lateral recumbent on examination table. The patient's shoulder was prepped with betadine and multiple alcohol swabs and utilizing ultrasound guidance, the patient's glenohumeral joint was identified on ultrasound. Using ultrasound guidance a 22-gauge, 3.5 inch needle with a mixture of 2:2:2 cc's lidocaine :bupivicaine:depomedrol was directed from a lateral to medial direction via in-plane technique into the glenohumeral joint with visualization of appropriate spread of injectate into the joint. Patient tolerated the procedure well without immediate complications.      Procedure, treatment alternatives, risks and benefits explained, specific risks discussed. Consent was given by the patient. Immediately prior to procedure a time out was called to verify the correct patient, procedure, equipment, support staff and site/side marked as required. Patient was prepped and draped in the usual sterile fashion.     - patient tolerated procedure well, discussed post-injection protocol - follow-up with Dr. Jerri as indicated; I am happy to see them as needed  Lonell Sprang, DO Primary Care Sports Medicine Physician  [facility]  This note was dictated using Dragon naturally  speaking software and may contain errors in syntax, spelling, or content which have not been identified prior to signing this note.

## 2024-04-17 ENCOUNTER — Other Ambulatory Visit: Payer: Self-pay

## 2024-04-17 ENCOUNTER — Other Ambulatory Visit (HOSPITAL_COMMUNITY): Payer: Self-pay

## 2024-04-17 NOTE — Progress Notes (Signed)
 Specialty Pharmacy Refill Coordination Note  Spoke with Marbeth Smedley is a 48 y.o. female contacted today regarding refills of specialty medication(s) Ofatumumab  (Kesimpta )  Doses on hand: 0  Injection date: 04/26/24   Patient requested: Delivery   Delivery date: 04/21/24   Verified address: [address] [place] [address]  Medication will be filled on 04/20/24.

## 2024-04-19 ENCOUNTER — Other Ambulatory Visit: Payer: Self-pay

## 2024-04-20 ENCOUNTER — Other Ambulatory Visit: Payer: Self-pay

## 2024-04-24 ENCOUNTER — Encounter (HOSPITAL_COMMUNITY): Payer: Self-pay

## 2024-04-24 ENCOUNTER — Other Ambulatory Visit (HOSPITAL_COMMUNITY): Payer: Self-pay

## 2024-04-24 ENCOUNTER — Other Ambulatory Visit: Payer: Self-pay

## 2024-04-24 MED ORDER — DICLOFENAC SODIUM 75 MG PO TBEC
75.0000 mg | DELAYED_RELEASE_TABLET | Freq: Two times a day (BID) | ORAL | 0 refills | Status: DC
  Filled 2024-04-24: qty 60, 30d supply, fill #0

## 2024-04-27 ENCOUNTER — Other Ambulatory Visit (HOSPITAL_COMMUNITY): Payer: Self-pay

## 2024-04-27 MED ORDER — METHOCARBAMOL 500 MG PO TABS
500.0000 mg | ORAL_TABLET | Freq: Three times a day (TID) | ORAL | 1 refills | Status: AC | PRN
  Filled 2024-04-27: qty 90, 30d supply, fill #0
  Filled 2024-08-07: qty 90, 30d supply, fill #1

## 2024-05-02 DIAGNOSIS — M545 Low back pain, unspecified: Secondary | ICD-10-CM | POA: Diagnosis not present

## 2024-05-04 ENCOUNTER — Other Ambulatory Visit (HOSPITAL_COMMUNITY): Payer: Self-pay

## 2024-05-04 DIAGNOSIS — G35D Multiple sclerosis, unspecified: Secondary | ICD-10-CM | POA: Diagnosis not present

## 2024-05-04 MED ORDER — METHYLPHENIDATE HCL 10 MG PO TABS
10.0000 mg | ORAL_TABLET | Freq: Every day | ORAL | 0 refills | Status: DC | PRN
  Filled 2024-05-04: qty 30, 30d supply, fill #0

## 2024-05-05 ENCOUNTER — Other Ambulatory Visit (HOSPITAL_COMMUNITY): Payer: Self-pay

## 2024-05-11 IMAGING — XA DG SPINAL PUNCT LUMBAR DIAG WITH FL CT GUIDANCE
1 series · 1 of 1 positions shown · non-contrast
Comparison: None Available.

CLINICAL DATA: White matter abnormality on brain MRI.

EXAM:
DIAGNOSTIC LUMBAR PUNCTURE UNDER FLUOROSCOPIC GUIDANCE

[Series 1: ortho adipose · 1 of 1 slices shown]
[im 1/1]
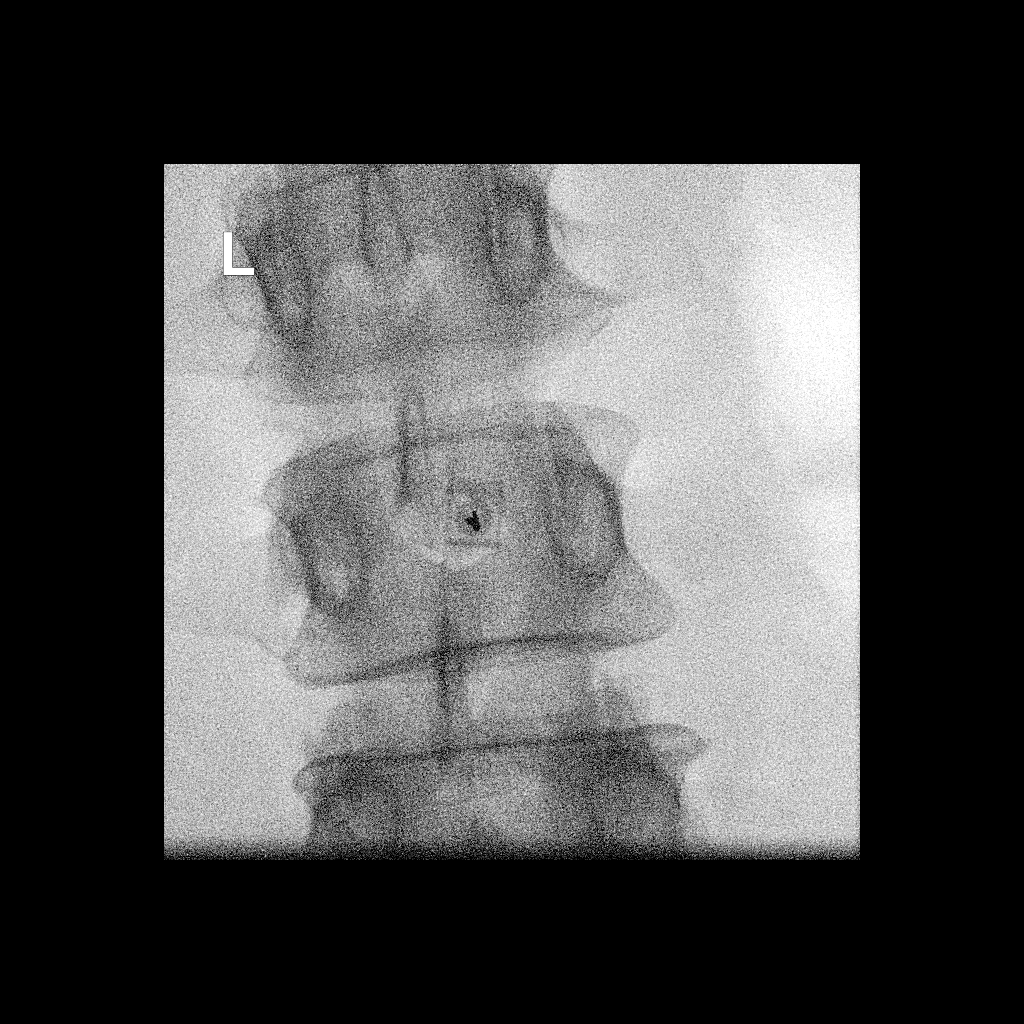

[1 of 1 positions shown; findings below may reference images not displayed]

FLUOROSCOPY:
Radiation Exposure Index (as provided by the fluoroscopic device):
Dose area product 11.5 uGy*m2

PROCEDURE:
Informed consent was obtained from the patient prior to the
procedure, including potential complications of headache, allergy,
and pain. With the patient prone, the lower back was prepped with
Betadine. 1% Lidocaine was used for local anesthesia. Lumbar
puncture was performed at the L2-3 level using a 20 gauge needle
with return of clear CSF with an opening pressure of 14 cm water.
7.0 ml of CSF were obtained for laboratory studies. The patient
tolerated the procedure well and there were no apparent
complications.
IMPRESSION: Technically successful fluoroscopic guided lumbar puncture via a
right paramedian approach at L2-3. Fluid sent for laboratory
analysis as requested.

## 2024-05-16 ENCOUNTER — Other Ambulatory Visit: Payer: Self-pay

## 2024-05-16 IMAGING — XA Imaging study
2 series · 2 of 2 positions shown · non-contrast
Comparison: none

CLINICAL DATA: CSF leak headache following lumbar puncture 5 days
ago.

[Series 1: ortho adipose · 1 of 1 slices shown (1 of 2)]
[im 1/1]
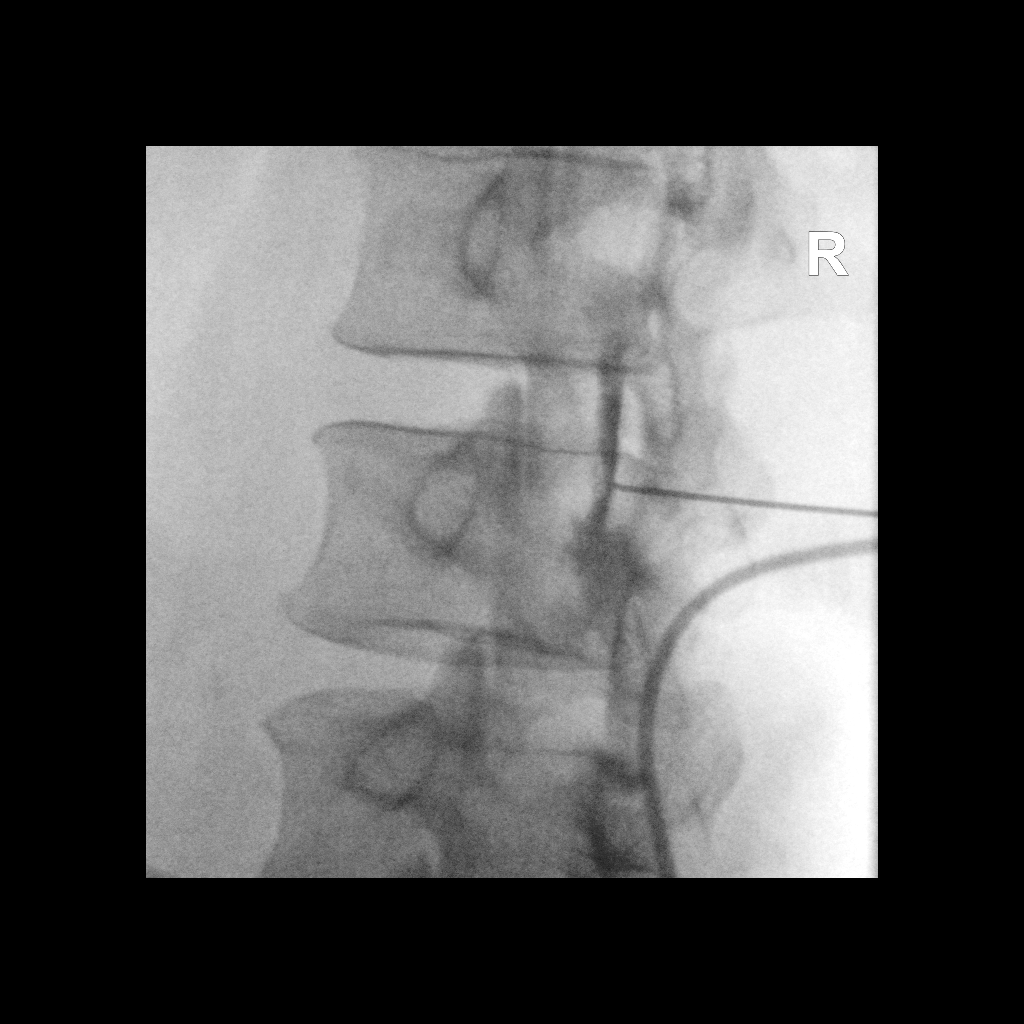

[Series 2: ortho adipose · 1 of 1 slices shown (2 of 2)]
[im 1/1]
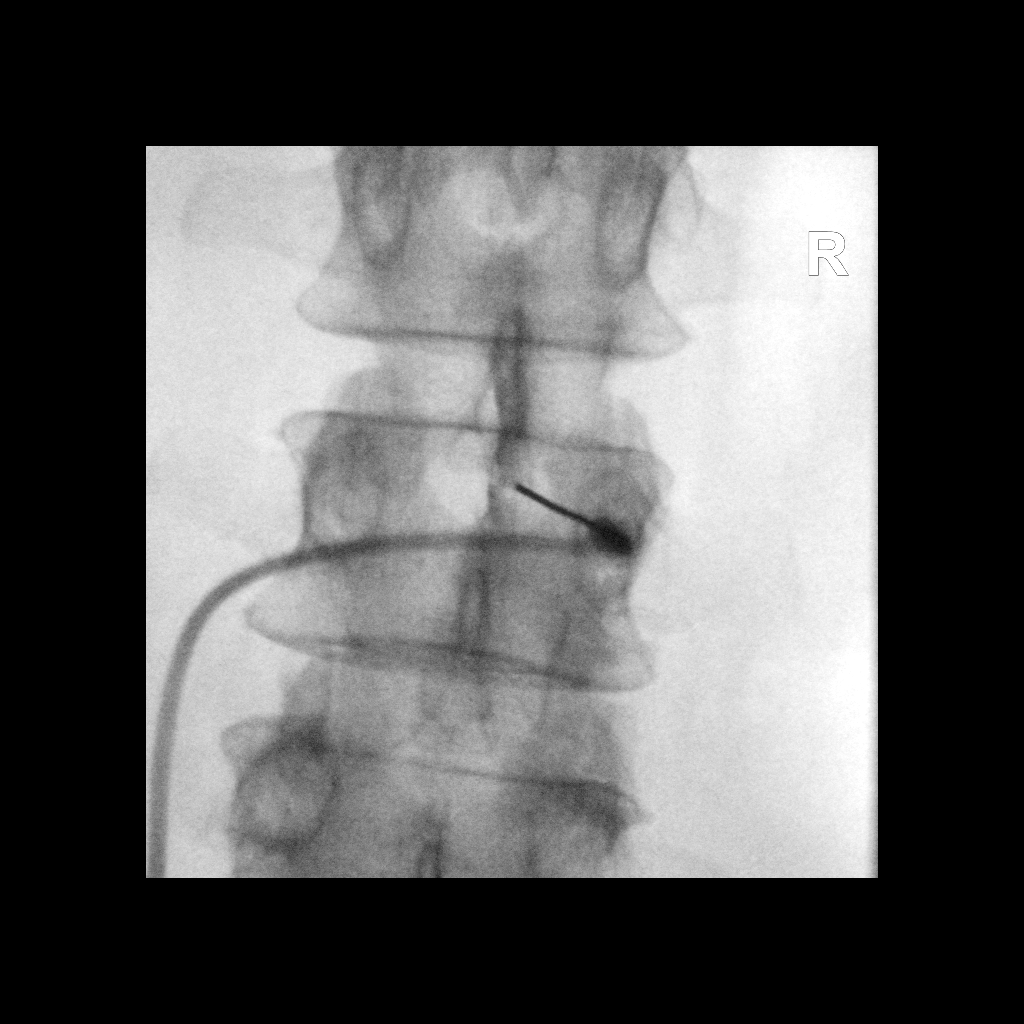

[2 of 2 positions shown; findings below may reference images not displayed]

FLUOROSCOPY:
Radiation Exposure Index (as provided by the fluoroscopic device): 0
minutes 41 seconds. 65.24 micro gray meter squared

PROCEDURE:
The procedure, risks, benefits, and alternatives were explained to
the patient. Questions regarding the procedure were encouraged and
answered. The patient understands and consents to the procedure.

25 cc of blood were drawn from the patient's right antecubital
fossa.

Epidural approach was performed on the right at L2-3. Loss of
resistance technique was utilized to access the epidural space.
Contrast injection shows good epidural positioning. 25 cc of the
patient's blood were then slowly injected into the epidural space.

The procedure was well-tolerated. The patient was encouraged to lie
down for the rest of the day and to limit activity tomorrow.

COMPLICATIONS:
None
IMPRESSION: Lumbar epidural blood patch on the right at L2-3.

## 2024-05-18 ENCOUNTER — Other Ambulatory Visit (HOSPITAL_COMMUNITY): Payer: Self-pay

## 2024-05-18 ENCOUNTER — Other Ambulatory Visit: Payer: Self-pay

## 2024-05-18 ENCOUNTER — Other Ambulatory Visit: Payer: Self-pay | Admitting: Pharmacy Technician

## 2024-05-18 NOTE — Progress Notes (Signed)
 Specialty Pharmacy Refill Coordination Note  Maria Wiley is a 48 y.o. female contacted today regarding refills of specialty medication(s) Ofatumumab  (Kesimpta )   Patient requested Delivery   Delivery date: 05/23/24   Verified address: [address]  [place]   Medication will be filled on 05/22/24.

## 2024-05-19 ENCOUNTER — Other Ambulatory Visit: Payer: Self-pay

## 2024-05-22 ENCOUNTER — Other Ambulatory Visit: Payer: Self-pay

## 2024-05-23 ENCOUNTER — Other Ambulatory Visit: Payer: Self-pay

## 2024-05-23 ENCOUNTER — Encounter: Payer: Self-pay | Admitting: Internal Medicine

## 2024-05-23 ENCOUNTER — Other Ambulatory Visit (HOSPITAL_COMMUNITY): Payer: Self-pay

## 2024-05-24 NOTE — Telephone Encounter (Signed)
**Note De-identified  [place] Obfuscation** Please advise 

## 2024-05-25 ENCOUNTER — Other Ambulatory Visit (HOSPITAL_COMMUNITY): Payer: Self-pay

## 2024-05-25 DIAGNOSIS — M545 Low back pain, unspecified: Secondary | ICD-10-CM | POA: Diagnosis not present

## 2024-05-27 ENCOUNTER — Other Ambulatory Visit (HOSPITAL_COMMUNITY): Payer: Self-pay

## 2024-05-29 ENCOUNTER — Other Ambulatory Visit: Payer: Self-pay

## 2024-06-01 DIAGNOSIS — M545 Low back pain, unspecified: Secondary | ICD-10-CM | POA: Diagnosis not present

## 2024-06-05 ENCOUNTER — Encounter: Payer: Self-pay | Admitting: Radiology

## 2024-06-05 ENCOUNTER — Other Ambulatory Visit: Payer: Self-pay

## 2024-06-05 ENCOUNTER — Other Ambulatory Visit (HOSPITAL_COMMUNITY): Payer: Self-pay

## 2024-06-05 ENCOUNTER — Ambulatory Visit (INDEPENDENT_AMBULATORY_CARE_PROVIDER_SITE_OTHER): Admitting: Internal Medicine

## 2024-06-05 ENCOUNTER — Encounter: Payer: Self-pay | Admitting: Internal Medicine

## 2024-06-05 VITALS — BP 110/74 | HR 85 | Temp 98.5°F | Ht 67.0 in | Wt 228.8 lb

## 2024-06-05 DIAGNOSIS — L659 Nonscarring hair loss, unspecified: Secondary | ICD-10-CM

## 2024-06-05 MED ORDER — MINOXIDIL 5 % EX SOLN
CUTANEOUS | 11 refills | Status: AC
  Filled 2024-06-05 – 2024-07-17 (×4): qty 120, fill #0

## 2024-06-05 NOTE — Progress Notes (Signed)
" ° °  Subjective:   Patient ID: Maria Wiley, female    DOB: 1976/01/31, 48 y.o.   MRN: [id number]  Discussed the use of AI scribe software for clinical note transcription with the patient, who gave verbal consent to proceed.  History of Present Illness Maria Wiley is a 48 year old female who presents with hair thinning around the edges.  She experiences gradual hair thinning primarily around the edges of her scalp, with increased hair loss noted during hair manipulation.  She has tried various shampoos and topical treatments without significant improvement in her hair thinning.  Her hair is perceived as weaker than before.  Previous evaluations included assessments of vitamin and thyroid  levels.   Review of Systems  Constitutional: Negative.   HENT: Negative.         Hair thinning  Eyes: Negative.   Respiratory:  Negative for cough, chest tightness and shortness of breath.   Cardiovascular:  Negative for chest pain, palpitations and leg swelling.  Gastrointestinal:  Negative for abdominal distention, abdominal pain, constipation, diarrhea, nausea and vomiting.  Musculoskeletal: Negative.   Skin: Negative.   Neurological: Negative.   Psychiatric/Behavioral: Negative.      Objective:  Physical Exam Constitutional:      Appearance: She is well-developed.  HENT:     Head: Normocephalic and atraumatic.  Cardiovascular:     Rate and Rhythm: Normal rate and regular rhythm.  Pulmonary:     Effort: Pulmonary effort is normal. No respiratory distress.     Breath sounds: Normal breath sounds. No wheezing or rales.  Abdominal:     General: Bowel sounds are normal. There is no distension.     Palpations: Abdomen is soft.     Tenderness: There is no abdominal tenderness.  Musculoskeletal:     Cervical back: Normal range of motion.  Skin:    General: Skin is warm and dry.  Neurological:     Mental Status: She is alert and oriented to person, place, and time.      Coordination: Coordination normal.     Vitals:   06/05/24 0836  BP: 110/74  Pulse: 85  Temp: 98.5 F (36.9 C)  SpO2: 98%  Weight: 228 lb 12.8 oz (103.8 kg)  Height: 5' 7 (1.702 m)    Assessment and Plan Assessment & Plan Female pattern hair loss   Gradual thinning is likely due to hormonal changes of pre-menopause or menopause. Prescribe minoxidil  topical for hair growth. Continued use is necessary to maintain benefits. Refer to dermatology for further evaluation and management.   "

## 2024-06-05 NOTE — Patient Instructions (Addendum)
 We have sent in the minoxidil to use on the scalp daily and it can take a few months to see full results. We will get you in with dermatology

## 2024-06-06 ENCOUNTER — Encounter (HOSPITAL_COMMUNITY): Payer: Self-pay

## 2024-06-06 ENCOUNTER — Other Ambulatory Visit (HOSPITAL_COMMUNITY): Payer: Self-pay

## 2024-06-07 NOTE — Assessment & Plan Note (Signed)
 Gradual thinning is likely due to hormonal changes of pre-menopause or menopause. Prescribe minoxidil topical for hair growth. Continued use is necessary to maintain benefits. Refer to dermatology for further evaluation and management.

## 2024-06-08 ENCOUNTER — Other Ambulatory Visit: Payer: Self-pay

## 2024-06-12 ENCOUNTER — Other Ambulatory Visit: Payer: Self-pay

## 2024-06-13 ENCOUNTER — Other Ambulatory Visit (HOSPITAL_COMMUNITY): Payer: Self-pay

## 2024-06-13 DIAGNOSIS — M450 Ankylosing spondylitis of multiple sites in spine: Secondary | ICD-10-CM | POA: Diagnosis not present

## 2024-06-13 MED ORDER — DICLOFENAC SODIUM 75 MG PO TBEC
75.0000 mg | DELAYED_RELEASE_TABLET | Freq: Two times a day (BID) | ORAL | 5 refills | Status: AC
  Filled 2024-06-13: qty 60, 30d supply, fill #0
  Filled 2024-07-10: qty 60, 30d supply, fill #1
  Filled 2024-08-07: qty 60, 30d supply, fill #2
  Filled 2024-09-04: qty 60, 30d supply, fill #3

## 2024-06-13 MED ORDER — SULFASALAZINE 500 MG PO TABS
1000.0000 mg | ORAL_TABLET | Freq: Two times a day (BID) | ORAL | 2 refills | Status: DC
  Filled 2024-06-13: qty 120, 30d supply, fill #0
  Filled 2024-07-10: qty 120, 30d supply, fill #1
  Filled 2024-08-07: qty 120, 30d supply, fill #2

## 2024-06-14 ENCOUNTER — Other Ambulatory Visit: Payer: Self-pay

## 2024-06-14 NOTE — Progress Notes (Signed)
 Specialty Pharmacy Refill Coordination Note  Maria Wiley is a 48 y.o. female contacted today regarding refills of specialty medication(s) Ofatumumab  (Kesimpta )   Patient requested Delivery   Delivery date: 06/21/24   Verified address: [address]  [place]   Medication will be filled on: 06/20/24

## 2024-06-15 ENCOUNTER — Other Ambulatory Visit (HOSPITAL_COMMUNITY): Payer: Self-pay

## 2024-06-16 ENCOUNTER — Other Ambulatory Visit (HOSPITAL_COMMUNITY): Payer: Self-pay

## 2024-06-20 ENCOUNTER — Ambulatory Visit: Payer: Self-pay

## 2024-06-20 ENCOUNTER — Encounter: Payer: Self-pay | Admitting: Family Medicine

## 2024-06-20 ENCOUNTER — Ambulatory Visit (INDEPENDENT_AMBULATORY_CARE_PROVIDER_SITE_OTHER)

## 2024-06-20 ENCOUNTER — Other Ambulatory Visit (HOSPITAL_COMMUNITY): Payer: Self-pay

## 2024-06-20 ENCOUNTER — Ambulatory Visit: Payer: Self-pay | Admitting: Family Medicine

## 2024-06-20 ENCOUNTER — Ambulatory Visit (INDEPENDENT_AMBULATORY_CARE_PROVIDER_SITE_OTHER): Admitting: Family Medicine

## 2024-06-20 ENCOUNTER — Other Ambulatory Visit: Payer: Self-pay

## 2024-06-20 VITALS — BP 122/84 | HR 85 | Temp 97.9°F | Ht 67.0 in | Wt 239.0 lb

## 2024-06-20 DIAGNOSIS — R051 Acute cough: Secondary | ICD-10-CM

## 2024-06-20 DIAGNOSIS — R5383 Other fatigue: Secondary | ICD-10-CM

## 2024-06-20 DIAGNOSIS — R059 Cough, unspecified: Secondary | ICD-10-CM | POA: Diagnosis not present

## 2024-06-20 DIAGNOSIS — R635 Abnormal weight gain: Secondary | ICD-10-CM

## 2024-06-20 DIAGNOSIS — E039 Hypothyroidism, unspecified: Secondary | ICD-10-CM

## 2024-06-20 DIAGNOSIS — R600 Localized edema: Secondary | ICD-10-CM

## 2024-06-20 DIAGNOSIS — R0609 Other forms of dyspnea: Secondary | ICD-10-CM

## 2024-06-20 DIAGNOSIS — R519 Headache, unspecified: Secondary | ICD-10-CM

## 2024-06-20 LAB — URINALYSIS, ROUTINE W REFLEX MICROSCOPIC
Bilirubin Urine: NEGATIVE
Hgb urine dipstick: NEGATIVE
Ketones, ur: NEGATIVE
Leukocytes,Ua: NEGATIVE
Nitrite: NEGATIVE
RBC / HPF: NONE SEEN (ref 0–?)
Specific Gravity, Urine: 1.015 (ref 1.000–1.030)
Total Protein, Urine: NEGATIVE
Urine Glucose: NEGATIVE
Urobilinogen, UA: 0.2 (ref 0.0–1.0)
WBC, UA: NONE SEEN (ref 0–?)
pH: 7 (ref 5.0–8.0)

## 2024-06-20 LAB — BASIC METABOLIC PANEL WITH GFR
BUN: 13 mg/dL (ref 6–23)
CO2: 29 meq/L (ref 19–32)
Calcium: 8.7 mg/dL (ref 8.4–10.5)
Chloride: 103 meq/L (ref 96–112)
Creatinine, Ser: 0.73 mg/dL (ref 0.40–1.20)
GFR: 96.34 mL/min (ref >60.00)
Glucose, Bld: 81 mg/dL (ref 70–99)
Potassium: 4.1 meq/L (ref 3.5–5.1)
Sodium: 137 meq/L (ref 135–145)

## 2024-06-20 LAB — CBC WITH DIFFERENTIAL/PLATELET
Basophils Absolute: 0.1 K/uL (ref 0.0–0.1)
Basophils Relative: 0.9 % (ref 0.0–3.0)
Eosinophils Absolute: 0.2 K/uL (ref 0.0–0.7)
Eosinophils Relative: 2.4 % (ref 0.0–5.0)
HCT: 32.1 % — ABNORMAL LOW (ref 36.0–46.0)
Hemoglobin: 10.3 g/dL — ABNORMAL LOW (ref 12.0–15.0)
Lymphocytes Relative: 29.5 % (ref 12.0–46.0)
Lymphs Abs: 2.2 K/uL (ref 0.7–4.0)
MCHC: 31.9 g/dL (ref 30.0–36.0)
MCV: 84.2 fl (ref 78.0–100.0)
Monocytes Absolute: 0.6 K/uL (ref 0.1–1.0)
Monocytes Relative: 8.6 % (ref 3.0–12.0)
Neutro Abs: 4.4 K/uL (ref 1.4–7.7)
Neutrophils Relative %: 58.6 % (ref 43.0–77.0)
Platelets: 284 K/uL (ref 150.0–400.0)
RBC: 3.82 Mil/uL — ABNORMAL LOW (ref 3.87–5.11)
RDW: 14 % (ref 11.5–15.5)
WBC: 7.5 K/uL (ref 4.0–10.5)

## 2024-06-20 LAB — TSH: TSH: 1.99 u[IU]/mL (ref 0.35–5.50)

## 2024-06-20 LAB — BRAIN NATRIURETIC PEPTIDE: Pro B Natriuretic peptide (BNP): 32 pg/mL (ref 0.0–100.0)

## 2024-06-20 MED ORDER — FUROSEMIDE 20 MG PO TABS
20.0000 mg | ORAL_TABLET | Freq: Every day | ORAL | 0 refills | Status: DC
  Filled 2024-06-20: qty 30, 30d supply, fill #0

## 2024-06-20 NOTE — Progress Notes (Signed)
 Subjective:     Patient ID: Maria Wiley, female    DOB: October 02, 1975, 48 y.o.   MRN: [id number]  Chief Complaint  Patient presents with   Leg Swelling    Both legs swollen since Sunday experiencing pain in left foot as well    HPI  Discussed the use of AI scribe software for clinical note transcription with the patient, who gave verbal consent to proceed.  History of Present Illness Maria Wiley is a 48 year old female with multiple sclerosis and rheumatoid arthritis who presents with bilateral lower extremity edema.  Peripheral edema - Bilateral lower extremity edema present since Sunday - Swelling improves with elevation of feet but does not fully resolve; returns each morning - Nine-pound weight gain since November 3rd despite adherence to a low sodium diet - Hydrochlorothiazide  started on Sunday, resulting in increased urination - No recent increase in fluid intake - No recent steroid use - Continues diclofenac  without changes - No current calf pain - Previous ultrasound ruled out deep vein thrombosis  Fatigue and dyspnea on exertion - Fatigue and DOE after exertion, especially when climbing stairs - Reduced activity at work due to feeling unwell  Headache - Persistent headache since Sunday - Headache is not severe  Respiratory symptoms - No chest pain - Mild cough without sputum  Gastrointestinal and genitourinary function - Regular bowel movements - Regular urination - Clear urine noted last night     Health Maintenance Due  Topic Date Due   Hepatitis C Screening  Never done   Hepatitis B Vaccines 19-59 Average Risk (2 of 3 - 19+ 3-dose series) 01/14/1995   Colonoscopy  Never done   COVID-19 Vaccine (4 - 2025-26 season) 04/03/2024    Past Medical History:  Diagnosis Date   Anemia    I been anemic for many years   Ankylosing spondylitis (HCC)    Anxiety    Since 2016   Chest pain    COVID-19 06/2020   GERD (gastroesophageal reflux  disease)    Hypertension    Migraines    Multiple sclerosis    RA (rheumatoid arthritis) (HCC)    Thyroid  disease    Urinary incontinence     Past Surgical History:  Procedure Laterality Date   ABDOMINAL HYSTERECTOMY     APPENDECTOMY     CHOLECYSTECTOMY     HERNIA REPAIR  2004   KNEE SURGERY Right 2006   TUBAL LIGATION  2012    Family History  Problem Relation Age of Onset   Hypertension Mother    Arthritis Mother    Irritable bowel syndrome Mother    Arthritis Maternal Grandmother    Heart disease Maternal Grandmother    Hyperlipidemia Maternal Grandmother    CAD Maternal Grandfather    Diabetes Maternal Grandfather    Hearing loss Maternal Grandfather    Heart disease Maternal Grandfather    Hyperlipidemia Maternal Grandfather    Diabetes Son    Migraines Neg Hx    Headache Neg Hx    Breast cancer Neg Hx    Colon cancer Neg Hx    Esophageal cancer Neg Hx     Social History   Socioeconomic History   Marital status: Married    Spouse name: Not on file   Number of children: 4   Years of education: Not on file   Highest education level: Master's degree (e.g., MA, MS, MEng, MEd, MSW, MBA)  Occupational History   Not on file  Tobacco Use   Smoking status: Never   Smokeless tobacco: Never  Vaping Use   Vaping status: Never Used  Substance and Sexual Activity   Alcohol use: Never   Drug use: Never   Sexual activity: Yes    Birth control/protection: Surgical  Other Topics Concern   Not on file  Social History Narrative   Lives at home with her husband & her 4 sons   Right handed   Caffeine : 2-3 cups per day   Social Drivers of Health   Financial Resource Strain: Low Risk  (12/17/2023)   Overall Financial Resource Strain (CARDIA)    Difficulty of Paying Living Expenses: Not hard at all  Food Insecurity: No Food Insecurity (12/17/2023)   Hunger Vital Sign    Worried About Running Out of Food in the Last Year: Never true    Ran Out of Food in the Last  Year: Never true  Transportation Needs: No Transportation Needs (12/17/2023)   PRAPARE - Administrator, Civil Service (Medical): No    Lack of Transportation (Non-Medical): No  Physical Activity: Insufficiently Active (12/17/2023)   Exercise Vital Sign    Days of Exercise per Week: 2 days    Minutes of Exercise per Session: 30 min  Stress: No Stress Concern Present (12/17/2023)   [employer] of Occupational Health - Occupational Stress Questionnaire    Feeling of Stress : Only a little  Social Connections: Moderately Integrated (12/17/2023)   Social Connection and Isolation Panel    Frequency of Communication with Friends and Family: More than three times a week    Frequency of Social Gatherings with Friends and Family: Twice a week    Attends Religious Services: More than 4 times per year    Active Member of [employer] or Organizations: No    Attends Engineer, Structural: Not on file    Marital Status: Married  Catering Manager Violence: Not on file    Outpatient Medications Prior to Visit  Medication Sig Dispense Refill   amLODipine  (NORVASC ) 5 MG tablet Take 1 tablet (5 mg total) by mouth daily. 90 tablet 3   aspirin  EC 81 MG tablet Take 1 tablet (81 mg total) by mouth daily. Swallow whole. 30 tablet 12   Cholecalciferol  (VITAMIN D3) 1000 units CAPS Take 1 tablet (1,000 Units) by mouth daily. 90 capsule 0   cyanocobalamin  (VITAMIN B12) 1000 MCG tablet Take 1 tablet (1,000 mcg total) by mouth daily. 90 tablet 3   cyclobenzaprine  (FLEXERIL ) 5 MG tablet Take 1 tablet (5 mg total) by mouth at bedtime as needed for muscle spasms. 30 tablet 0   diclofenac  (VOLTAREN ) 75 MG EC tablet Take 1 tablet (75 mg total) by mouth 2 (two) times daily. 60 tablet 5   escitalopram  (LEXAPRO ) 20 MG tablet Take 1 tablet (20 mg total) by mouth daily. 90 tablet 3   fluticasone  (FLONASE ) 50 MCG/ACT nasal spray Place 2 sprays into both nostrils daily. 16 g 1   folic acid  (FOLVITE ) 1 MG  tablet Take 1 tablet (1 mg total) by mouth daily. 90 tablet 3   gabapentin  (NEURONTIN ) 300 MG capsule Take 2 capsules (600 mg total) by mouth at bedtime. 180 capsule 3   gabapentin  (NEURONTIN ) 300 MG capsule Take 3 capsules (900 mg total) by mouth at bedtime. 270 capsule 3   levothyroxine  (SYNTHROID ) 25 MCG tablet Take 1 tablet (25 mcg total) by mouth daily before breakfast. 90 tablet 3   linaclotide  (LINZESS ) 145 MCG CAPS  capsule Take by mouth.     Magnesium  250 MG TABS Take 1 tablet (250 mg total) by mouth daily. 90 tablet 3   methocarbamol  (ROBAXIN ) 500 MG tablet Take 1 tablet (500 mg total) by mouth 3 (three) times a day as needed for muscle spasms. 40 tablet 0   methocarbamol  (ROBAXIN ) 500 MG tablet Take 1 tablet (500 mg total) by mouth 3 (three) times a day as needed for muscle spasms. 90 tablet 1   methylphenidate (RITALIN) 10 MG tablet Take 1 tablet (10 mg total) by mouth daily as needed (fatigue). 30 tablet 0   MINOXIDIL, TOPICAL, 5 % SOLN Use daily as directed. 120 mL 11   Ofatumumab  (KESIMPTA ) 20 MG/0.4ML SOAJ Inject 0.4 mLs (20 mg total) into the skin every 28 days. 0.4 mL 11   pimecrolimus  (ELIDEL ) 1 % cream Apply 1 Application topically 2 (two) times daily. (Patient taking differently: Apply 1 Application topically daily as needed.) 30 g 2   solifenacin  (VESICARE ) 10 MG tablet Take 1 tablet (10 mg total) by mouth daily. 90 tablet 3   sulfaSALAzine  (AZULFIDINE ) 500 MG tablet Take 2 tablets (1,000 mg total) by mouth 2 (two) times daily. 120 tablet 2   traZODone  (DESYREL ) 50 MG tablet Take 2 tablets (100 mg total) by mouth at bedtime. 180 tablet 3   hydrochlorothiazide  (MICROZIDE ) 12.5 MG capsule Take 1 capsule (12.5 mg total) by mouth daily. 90 capsule 3   estradiol  (CLIMARA  - DOSED IN MG/24 HR) 0.0375 mg/24hr patch Place 1 patch (0.0375 mg total) onto the skin once a week. (Patient not taking: Reported on 06/20/2024) 4 patch 12   gabapentin  (NEURONTIN ) 100 MG capsule Take 1 capsule (100  mg total) by mouth 2 (two) times daily as needed. (Patient not taking: Reported on 06/20/2024) 180 capsule 3   valACYclovir  (VALTREX ) 1000 MG tablet Take 1 tablet (1,000 mg total) by mouth 2 (two) times daily. (Patient not taking: Reported on 06/20/2024) 180 tablet 3   No facility-administered medications prior to visit.    No Known Allergies  Review of Systems  Constitutional:  Positive for malaise/fatigue. Negative for chills, fever and weight loss.  Eyes:  Negative for blurred vision, double vision and photophobia.  Respiratory:  Positive for cough and shortness of breath.        Dry, mild, cough. Shob with stairs (new)  Cardiovascular:  Positive for leg swelling. Negative for chest pain and palpitations.       Bilateral edema, non pitting  Gastrointestinal:  Negative for abdominal pain, constipation, diarrhea, nausea and vomiting.  Genitourinary:  Negative for dysuria, frequency and urgency.  Musculoskeletal:  Positive for joint pain.  Neurological:  Positive for headaches. Negative for dizziness and focal weakness.       Objective:    Physical Exam Constitutional:      General: She is not in acute distress.    Appearance: She is not ill-appearing.  HENT:     Mouth/Throat:     Mouth: Mucous membranes are moist.     Pharynx: Oropharynx is clear.  Eyes:     Extraocular Movements: Extraocular movements intact.     Conjunctiva/sclera: Conjunctivae normal.     Pupils: Pupils are equal, round, and reactive to light.  Cardiovascular:     Rate and Rhythm: Normal rate and regular rhythm.     Pulses: Normal pulses.  Pulmonary:     Effort: Pulmonary effort is normal.     Breath sounds: Normal breath sounds.  Musculoskeletal:  Cervical back: Normal range of motion and neck supple.     Right lower leg: Edema present.     Left lower leg: Edema present.  Skin:    General: Skin is warm and dry.  Neurological:     General: No focal deficit present.     Mental Status: She is  alert and oriented to person, place, and time.     Cranial Nerves: No cranial nerve deficit.     Motor: No weakness.     Coordination: Coordination normal.     Gait: Gait normal.  Psychiatric:        Mood and Affect: Mood normal.        Behavior: Behavior normal.        Thought Content: Thought content normal.      BP 122/84   Pulse 85   Temp 97.9 F (36.6 C) (Temporal)   Ht 5' 7 (1.702 m)   Wt 239 lb (108.4 kg)   SpO2 98%   BMI 37.43 kg/m  Wt Readings from Last 3 Encounters:  06/20/24 239 lb (108.4 kg)  06/05/24 228 lb 12.8 oz (103.8 kg)  12/20/23 216 lb (98 kg)       Assessment & Plan:   Problem List Items Addressed This Visit     Hypothyroidism   Relevant Orders   TSH (Completed)   Other Visit Diagnoses       Bilateral leg edema    -  Primary   Relevant Orders   Brain natriuretic peptide (Completed)   Basic metabolic panel with GFR (Completed)   Urinalysis, Routine w reflex microscopic (Completed)   DG Chest 2 View (Completed)   EKG 12-Lead     DOE (dyspnea on exertion)       Relevant Orders   Brain natriuretic peptide (Completed)   CBC with Differential/Platelet (Completed)   DG Chest 2 View (Completed)   EKG 12-Lead     Acute cough       Relevant Orders   Brain natriuretic peptide (Completed)   CBC with Differential/Platelet (Completed)   DG Chest 2 View (Completed)     Acute nonintractable headache, unspecified headache type         Weight gain       Relevant Orders   TSH (Completed)   Brain natriuretic peptide (Completed)   Basic metabolic panel with GFR (Completed)   Urinalysis, Routine w reflex microscopic (Completed)   DG Chest 2 View (Completed)     Fatigue, unspecified type       Relevant Orders   TSH (Completed)   Basic metabolic panel with GFR (Completed)   CBC with Differential/Platelet (Completed)   Urinalysis, Routine w reflex microscopic (Completed)       Assessment and Plan Assessment & Plan Bilateral lower extremity  edema with associated fatigue, abnormal weight gain, exertional dyspnea, and acute cough Bilateral lower extremity edema with associated fatigue, abnormal weight gain of 9 pounds since November 3rd, exertional dyspnea, and acute cough. Swelling improves with elevation but recurs upon standing. No pitting edema. No recent increase in fluid intake or dietary sodium. No calf pain or recent blood clots. Differential includes medication-induced edema, heart failure, or other systemic causes. Recent labs show mild anemia, otherwise unremarkable. EKG shows normal sinus rhythm with no acute ischemia. Lungs clear on auscultation. - Ordered BNP to assess for heart failure - Ordered chest x-ray to evaluate lungs and heart - Ordered EKG to assess cardiac function - Switched hydrochlorothiazide  to Lasix 20 mg  daily PRN for edema - Ordered BMP, TSH, and urinalysis - Advised to continue wearing compression garments - Instructed to follow up with PCP in approximately two weeks or sooner if needed  Headache Mild headache since Sunday, not severe. No associated neurological symptoms.    EKG shows NSR, rate 74, low QRS voltage, nonspecific T wave. Essentially unchanged from EKG in 2025.     I have discontinued Shamaine K. Minerva [name]'s hydrochlorothiazide . I am also having her start on furosemide. Additionally, I am having her maintain her folic acid , aspirin  EC, Vitamin D3, linaclotide , cyanocobalamin , cyclobenzaprine , gabapentin , fluticasone , escitalopram , Magnesium , solifenacin , levothyroxine , traZODone , amLODipine , valACYclovir , gabapentin , Kesimpta , pimecrolimus , gabapentin , methocarbamol , estradiol , methocarbamol , methylphenidate, MINOXIDIL (TOPICAL), diclofenac , and sulfaSALAzine .  Meds ordered this encounter  Medications   furosemide (LASIX) 20 MG tablet    Sig: Take 1 tablet (20 mg total) by mouth daily. As needed for swelling    Dispense:  30 tablet    Refill:  0    Supervising Provider:    ROLLENE NORRIS A [[id number]]

## 2024-06-20 NOTE — Patient Instructions (Addendum)
 Please go downstairs for labs, a urine test and chest x-ray before you leave.  Stop taking hydrochlorothiazide .  Start taking furosemide (Lasix) 20 mg daily as needed for edema/swelling.  -Elevate your legs when you are sitting.  -Continue limiting sodium in your diet.  -Stay hydrated.  -Wear socks with compression  I will be in touch with your results from today.  Please follow-up with Dr. Rollene in 2 weeks.

## 2024-06-20 NOTE — Telephone Encounter (Signed)
  FYI Only or Action Required?: FYI only for provider: appointment scheduled on 06/20/2024 at 11am with [facility] NP-C.  Patient was last seen in primary care on 06/05/2024 by Rollene Almarie LABOR, MD.  Called Nurse Triage reporting Leg Swelling.  Symptoms began several days ago.  Interventions attempted: Rest, hydration, or home remedies.  Symptoms are: unchanged.  Triage Disposition: See HCP Within 4 Hours (Or PCP Triage)  Patient/caregiver understands and will follow disposition?: Yes                     Copied from CRM #[id number]. Topic: Clinical - Red Word Triage >> Jun 20, 2024  8:21 AM Ivette P wrote: [facility] that prompted transfer to Nurse Triage: alot of flowing on my body - put on 10 pounds. legs and feet are swollen. arms have swelling. dont know what cause it. not eating in excess Reason for Disposition . [1] Thigh, calf, or ankle swelling AND [2] bilateral AND [3] 1 side is more swollen  Answer Assessment - Initial Assessment Questions Patient states yesterday the swelling was bad She woke up with her feet elevated She states that they are swollen a lot She states she is swelling in her arms Saw PCP on Nov 3rd and has gained almost ten pounds since then This morning patient walked up the stairs she states that she did feel slightly winded but denies shortness of breath  Patient is advised to call us  back if anything changes or with any further questions/concerns. Patient is advised that if anything worsens to go to the Emergency Room. Patient verbalized understanding.    1. ONSET: When did the swelling start? (e.g., minutes, hours, days)     Several days ago--Sunday first noticed 2. LOCATION: What part of the leg is swollen?  Are both legs swollen or just one leg?     Both legs---right is worse 3. SEVERITY: How bad is the swelling? (e.g., localized; mild, moderate, severe)     Pretty swollen 4. REDNESS: Is there redness or signs  of infection?     ---- 5. PAIN: Is the swelling painful to touch? If Yes, ask: How painful is it?   (Scale 1-10; mild, moderate or severe)     Some pain in feet but I also have rheumatoid arthritis 6. FEVER: Do you have a fever? If Yes, ask: What is it, how was it measured, and when did it start?      Denies 7. CAUSE: What do you think is causing the leg swelling?     unsure 8. MEDICAL HISTORY: Do you have a history of blood clots (e.g., DVT), cancer, heart failure, kidney disease, or liver failure?     Was checked a few months ago but no clots found  this was checked due to calf pain 9. RECURRENT SYMPTOM: Have you had leg swelling before? If Yes, ask: When was the last time? What happened that time?     A long time ago and was given fluid pills when  10. OTHER SYMPTOMS: Do you have any other symptoms? (e.g., chest pain, difficulty breathing)       Some slight windedness going up stairs 11. PREGNANCY: Is there any chance you are pregnant? When was your last menstrual period?       No  Protocols used: Leg Swelling and Edema-A-AH

## 2024-06-23 ENCOUNTER — Other Ambulatory Visit (HOSPITAL_COMMUNITY): Payer: Self-pay

## 2024-06-23 DIAGNOSIS — M545 Low back pain, unspecified: Secondary | ICD-10-CM | POA: Diagnosis not present

## 2024-06-27 ENCOUNTER — Other Ambulatory Visit (HOSPITAL_COMMUNITY): Payer: Self-pay

## 2024-06-27 MED ORDER — METHYLPHENIDATE HCL 10 MG PO TABS
10.0000 mg | ORAL_TABLET | Freq: Every day | ORAL | 0 refills | Status: DC | PRN
  Filled 2024-06-27: qty 30, 30d supply, fill #0

## 2024-07-04 ENCOUNTER — Encounter: Payer: Self-pay | Admitting: Internal Medicine

## 2024-07-04 ENCOUNTER — Ambulatory Visit: Admitting: Internal Medicine

## 2024-07-04 ENCOUNTER — Other Ambulatory Visit (HOSPITAL_COMMUNITY): Payer: Self-pay

## 2024-07-04 VITALS — BP 122/70 | HR 74 | Temp 98.4°F | Ht 67.0 in | Wt 238.2 lb

## 2024-07-04 DIAGNOSIS — R232 Flushing: Secondary | ICD-10-CM

## 2024-07-04 DIAGNOSIS — I10 Essential (primary) hypertension: Secondary | ICD-10-CM | POA: Diagnosis not present

## 2024-07-04 MED ORDER — ESTROGENS CONJUGATED 0.3 MG PO TABS
0.3000 mg | ORAL_TABLET | Freq: Every day | ORAL | 3 refills | Status: DC
  Filled 2024-07-04: qty 30, 30d supply, fill #0
  Filled 2024-08-07 – 2024-08-09 (×2): qty 30, 30d supply, fill #1
  Filled 2024-09-04: qty 30, 30d supply, fill #2

## 2024-07-04 NOTE — Patient Instructions (Signed)
 We will do the estrogen pill instead of the patch.  We will stop the amlodipine  and watch BP for the next few weeks.

## 2024-07-04 NOTE — Progress Notes (Signed)
   Subjective:   Patient ID: Maria Wiley, female    DOB: [birth year], 48 y.o.   MRN: [id number]  Discussed the use of AI scribe software for clinical note transcription with the patient, who gave verbal consent to proceed.  History of Present Illness Maria Wiley is a 48 year old female who presents with peripheral edema and menopausal symptoms.  She has been experiencing intermittent swelling in her feet and ankles, which appeared suddenly within the last few weeks to a month and has progressively worsened. Initially, she did not notice the swelling until it was pointed out to her. She uses a diuretic as needed when she notices swelling but does not take it daily. Previous blood work to check for heart failure and thyroid  problems returned normal results, and a blood clot in her leg was ruled out. She is currently taking amlodipine , which she has been on for years, and Kesimpta , both of which have a known risk of peripheral edema. She mentions having experienced a similar issue earlier in the year, which resolved but has now returned more frequently.  In addition to the swelling, she is experiencing significant menopausal symptoms, particularly night sweats. She wakes up multiple times at night to change clothes due to excessive sweating. She previously tried estrogen patches, but they were ineffective as they kept coming off. She is currently on escitalopram  (Lexapro ) for menopausal symptoms, but it has not provided sufficient relief.  Review of Systems  Constitutional: Negative.   HENT: Negative.    Eyes: Negative.   Respiratory:  Negative for cough, chest tightness and shortness of breath.   Cardiovascular:  Positive for leg swelling. Negative for chest pain and palpitations.  Gastrointestinal:  Negative for abdominal distention, abdominal pain, constipation, diarrhea, nausea and vomiting.  Endocrine: Positive for heat intolerance.  Musculoskeletal: Negative.   Skin: Negative.    Neurological: Negative.   Psychiatric/Behavioral: Negative.      Objective:  Physical Exam Constitutional:      Appearance: She is well-developed.  HENT:     Head: Normocephalic and atraumatic.  Cardiovascular:     Rate and Rhythm: Normal rate and regular rhythm.  Pulmonary:     Effort: Pulmonary effort is normal. No respiratory distress.     Breath sounds: Normal breath sounds. No wheezing or rales.  Abdominal:     General: Bowel sounds are normal. There is no distension.     Palpations: Abdomen is soft.     Tenderness: There is no abdominal tenderness.  Musculoskeletal:     Cervical back: Normal range of motion.  Skin:    General: Skin is warm and dry.  Neurological:     Mental Status: She is alert and oriented to person, place, and time.     Coordination: Coordination normal.     There were no vitals filed for this visit.  Assessment and Plan Assessment & Plan Peripheral edema/essential hypertension Intermittent edema in her feet and ankles was noted. Blood work ruled out heart failure and thyroid  issues. Amlodipine  and Kesimpta  were identified as potential causes, with no DVT detected. Discontinued amlodipine  and advised home blood pressure monitoring. Continue diuretics as needed. If blood pressure remains stable and swelling reduces, maintain regimen without amlodipine . Consider an alternative antihypertensive if blood pressure rises.  Menopausal symptoms   She experiences severe menopausal symptoms. Estrogen patches were ineffective due to the patch not staying on, and escitalopram  was inadequate. Prescribed oral estrogen adjust dose as needed in 1 month.

## 2024-07-06 DIAGNOSIS — M0609 Rheumatoid arthritis without rheumatoid factor, multiple sites: Secondary | ICD-10-CM | POA: Diagnosis not present

## 2024-07-06 DIAGNOSIS — Z79899 Other long term (current) drug therapy: Secondary | ICD-10-CM | POA: Diagnosis not present

## 2024-07-06 DIAGNOSIS — Z6836 Body mass index (BMI) 36.0-36.9, adult: Secondary | ICD-10-CM | POA: Diagnosis not present

## 2024-07-06 DIAGNOSIS — E669 Obesity, unspecified: Secondary | ICD-10-CM | POA: Diagnosis not present

## 2024-07-06 DIAGNOSIS — Z111 Encounter for screening for respiratory tuberculosis: Secondary | ICD-10-CM | POA: Diagnosis not present

## 2024-07-06 DIAGNOSIS — M79672 Pain in left foot: Secondary | ICD-10-CM | POA: Diagnosis not present

## 2024-07-06 DIAGNOSIS — R21 Rash and other nonspecific skin eruption: Secondary | ICD-10-CM | POA: Diagnosis not present

## 2024-07-06 DIAGNOSIS — M450 Ankylosing spondylitis of multiple sites in spine: Secondary | ICD-10-CM | POA: Diagnosis not present

## 2024-07-06 DIAGNOSIS — R5383 Other fatigue: Secondary | ICD-10-CM | POA: Diagnosis not present

## 2024-07-10 ENCOUNTER — Encounter (HOSPITAL_COMMUNITY): Payer: Self-pay

## 2024-07-10 ENCOUNTER — Other Ambulatory Visit: Payer: Self-pay

## 2024-07-10 ENCOUNTER — Other Ambulatory Visit (HOSPITAL_COMMUNITY): Payer: Self-pay

## 2024-07-11 ENCOUNTER — Other Ambulatory Visit: Payer: Self-pay

## 2024-07-12 ENCOUNTER — Other Ambulatory Visit: Payer: Self-pay | Admitting: Pharmacy Technician

## 2024-07-12 ENCOUNTER — Other Ambulatory Visit (HOSPITAL_COMMUNITY): Payer: Self-pay

## 2024-07-12 ENCOUNTER — Other Ambulatory Visit: Payer: Self-pay

## 2024-07-12 NOTE — Progress Notes (Signed)
 Specialty Pharmacy Refill Coordination Note  MELLISSA CONLEY is a 48 y.o. female contacted today regarding refills of specialty medication(s) Ofatumumab  (Kesimpta )   Patient requested (Patient-Rptd) Delivery   Delivery date: 07/18/2024  Verified address: (Patient-Rptd) [address] [place], [place] [address]   Medication will be filled on: 07/17/2024

## 2024-07-16 ENCOUNTER — Encounter: Payer: Self-pay | Admitting: Internal Medicine

## 2024-07-17 ENCOUNTER — Other Ambulatory Visit: Payer: Self-pay

## 2024-07-17 ENCOUNTER — Other Ambulatory Visit: Payer: Self-pay | Admitting: Family Medicine

## 2024-07-17 ENCOUNTER — Other Ambulatory Visit (HOSPITAL_COMMUNITY): Payer: Self-pay

## 2024-07-18 MED ORDER — SEMAGLUTIDE-WEIGHT MANAGEMENT 0.25 MG/0.5ML ~~LOC~~ SOAJ: 0.2500 mg | SUBCUTANEOUS | 0 refills | Status: AC

## 2024-07-18 MED ORDER — SEMAGLUTIDE-WEIGHT MANAGEMENT 1 MG/0.5ML ~~LOC~~ SOAJ: 1.0000 mg | SUBCUTANEOUS | 0 refills | Status: AC

## 2024-07-18 MED ORDER — SEMAGLUTIDE-WEIGHT MANAGEMENT 0.5 MG/0.5ML ~~LOC~~ SOAJ: 0.5000 mg | SUBCUTANEOUS | 0 refills | Status: AC

## 2024-07-18 MED ORDER — SEMAGLUTIDE-WEIGHT MANAGEMENT 2.4 MG/0.75ML ~~LOC~~ SOAJ: 2.4000 mg | SUBCUTANEOUS | 0 refills | Status: AC

## 2024-07-18 MED ORDER — SEMAGLUTIDE-WEIGHT MANAGEMENT 1.7 MG/0.75ML ~~LOC~~ SOAJ: 1.7000 mg | SUBCUTANEOUS | 0 refills | Status: AC

## 2024-07-21 ENCOUNTER — Other Ambulatory Visit: Payer: Self-pay

## 2024-07-21 ENCOUNTER — Other Ambulatory Visit (HOSPITAL_COMMUNITY): Payer: Self-pay

## 2024-07-21 DIAGNOSIS — M545 Low back pain, unspecified: Secondary | ICD-10-CM | POA: Diagnosis not present

## 2024-07-21 MED ORDER — FUROSEMIDE 20 MG PO TABS
20.0000 mg | ORAL_TABLET | Freq: Every day | ORAL | 0 refills | Status: AC
  Filled 2024-07-21: qty 30, 30d supply, fill #0

## 2024-08-07 ENCOUNTER — Other Ambulatory Visit: Payer: Self-pay

## 2024-08-07 ENCOUNTER — Other Ambulatory Visit (HOSPITAL_COMMUNITY): Payer: Self-pay

## 2024-08-07 ENCOUNTER — Other Ambulatory Visit: Payer: Self-pay | Admitting: Internal Medicine

## 2024-08-07 NOTE — Progress Notes (Signed)
 Specialty Pharmacy Refill Coordination Note  Maria Wiley is a 49 y.o. female contacted today regarding refills of specialty medication(s) Ofatumumab  (Kesimpta )   Patient requested Delivery   Delivery date: 08/22/24   Verified address: [address], [place], [place] [address]   Medication will be filled on: 08/21/24

## 2024-08-08 ENCOUNTER — Other Ambulatory Visit (HOSPITAL_COMMUNITY): Payer: Self-pay

## 2024-08-08 ENCOUNTER — Other Ambulatory Visit: Payer: Self-pay

## 2024-08-09 ENCOUNTER — Other Ambulatory Visit (HOSPITAL_COMMUNITY): Payer: Self-pay

## 2024-08-09 ENCOUNTER — Other Ambulatory Visit: Payer: Self-pay | Admitting: Internal Medicine

## 2024-08-09 DIAGNOSIS — Z1231 Encounter for screening mammogram for malignant neoplasm of breast: Secondary | ICD-10-CM

## 2024-08-10 ENCOUNTER — Other Ambulatory Visit (HOSPITAL_COMMUNITY): Payer: Self-pay

## 2024-08-10 MED ORDER — METHYLPHENIDATE HCL 10 MG PO TABS
10.0000 mg | ORAL_TABLET | Freq: Every day | ORAL | 0 refills | Status: AC | PRN
  Filled 2024-08-10: qty 30, 30d supply, fill #0

## 2024-08-11 MED ORDER — VITAMIN D3 1000 UNITS PO CAPS
1000.0000 [IU] | ORAL_CAPSULE | Freq: Every day | ORAL | 0 refills | Status: AC
  Filled 2024-08-11: qty 90, 90d supply, fill #0

## 2024-08-11 MED ORDER — VITAMIN B-12 1000 MCG PO TABS
1000.0000 ug | ORAL_TABLET | Freq: Every day | ORAL | 3 refills | Status: AC
  Filled 2024-08-11: qty 90, 90d supply, fill #0

## 2024-08-15 ENCOUNTER — Other Ambulatory Visit: Payer: Self-pay

## 2024-08-15 ENCOUNTER — Other Ambulatory Visit (HOSPITAL_COMMUNITY): Payer: Self-pay

## 2024-08-18 ENCOUNTER — Other Ambulatory Visit: Payer: Self-pay

## 2024-08-21 ENCOUNTER — Other Ambulatory Visit: Payer: Self-pay

## 2024-08-22 ENCOUNTER — Other Ambulatory Visit: Payer: Self-pay

## 2024-08-23 ENCOUNTER — Ambulatory Visit: Admitting: Orthopaedic Surgery

## 2024-08-24 ENCOUNTER — Ambulatory Visit
Admission: EM | Admit: 2024-08-24 | Discharge: 2024-08-24 | Disposition: A | Discharge status: Home / Self Care | Attending: Family Medicine | Admitting: Family Medicine

## 2024-08-24 ENCOUNTER — Encounter: Payer: Self-pay | Admitting: Emergency Medicine

## 2024-08-24 DIAGNOSIS — G8929 Other chronic pain: Secondary | ICD-10-CM

## 2024-08-24 DIAGNOSIS — M25562 Pain in left knee: Secondary | ICD-10-CM

## 2024-08-24 NOTE — ED Triage Notes (Signed)
 Pt presents with left knee pain x 2 months. Pt denies any injury. She has taken diclofenac  for the pain with no relief.

## 2024-08-24 NOTE — ED Provider Notes (Signed)
 " [facility]    CSN: [id number] Arrival date & time: 08/24/24  9063      History   Chief Complaint Chief Complaint  Patient presents with   Knee Pain    HPI Maria Wiley is a 48 y.o. female.    Knee Pain  Patient is here for left knee pain x several months.  I has been worsening.  She did fall since it has been hurting, but did not have an injury prior to the fall.  She has fluid on it, it is swollen.  She has not had an xray of this knee before. She did have an apt with ortho yesterday, cancelled, and now scheduled for next week wedesday.  She is taking diclofenac  for pain that she already had at home for arthritis.          Past Medical History:  Diagnosis Date   Anemia    I been anemic for many years   Ankylosing spondylitis (HCC)    Anxiety    Since 2016   Chest pain    COVID-19 06/2020   GERD (gastroesophageal reflux disease)    Hypertension    Migraines    Multiple sclerosis    RA (rheumatoid arthritis) (HCC)    Thyroid  disease    Urinary incontinence     Patient Active Problem List   Diagnosis Date Noted   Acute pain of right shoulder 04/05/2024   Hashimoto thyroiditis 10/19/2023   Anemia 10/19/2023   Dysphagia 05/06/2023   Hot flashes 03/24/2023   Insomnia 09/30/2022   Migraines 06/09/2022   Multiple sclerosis 05/11/2022   Vision loss 09/26/2021   B12 deficiency 09/26/2021   Obesity 08/20/2021   Numbness and tingling 02/07/2021   Chronic migraine without aura without status migrainosus, not intractable 05/10/2019   Allergic rhinitis 05/08/2019   Essential hypertension 03/10/2019   Attention and concentration deficit 01/24/2019   Hair thinning 02/25/2018   Ankylosing spondylitis (HCC) 01/15/2018   Routine general medical examination at a health care facility 01/15/2018   IBS (irritable bowel syndrome) 01/15/2018   RA (rheumatoid arthritis) (HCC)    Hypothyroidism     Past Surgical History:  Procedure Laterality  Date   ABDOMINAL HYSTERECTOMY     APPENDECTOMY     CHOLECYSTECTOMY     HERNIA REPAIR  2004   KNEE SURGERY Right 2006   TUBAL LIGATION  2012    OB History   No obstetric history on file.      Home Medications    Prior to Admission medications  Medication Sig Start Date End Date Taking? Authorizing Provider  amLODipine  (NORVASC ) 5 MG tablet Take 1 tablet (5 mg total) by mouth daily. 12/20/23   Rollene Almarie LABOR, MD  aspirin  EC 81 MG tablet Take 1 tablet (81 mg total) by mouth daily. Swallow whole. 06/10/22   Gherghe, Costin M, MD  Cholecalciferol  (VITAMIN D3) 1000 units CAPS Take 1 tablet (1,000 Units) by mouth daily. 08/11/24   Rollene Almarie LABOR, MD  cyanocobalamin  (VITAMIN B12) 1000 MCG tablet Take 1 tablet (1,000 mcg total) by mouth daily. 08/11/24   Rollene Almarie LABOR, MD  cyclobenzaprine  (FLEXERIL ) 5 MG tablet Take 1 tablet (5 mg total) by mouth at bedtime as needed for muscle spasms. 01/22/23   Christopher Savannah, PA-C  diclofenac  (VOLTAREN ) 75 MG EC tablet Take 1 tablet (75 mg total) by mouth 2 (two) times daily. 06/13/24     escitalopram  (LEXAPRO ) 20 MG tablet Take 1 tablet (20 mg  total) by mouth daily. 12/20/23   Rollene Almarie LABOR, MD  estrogens , conjugated, (PREMARIN ) 0.3 MG tablet Take 1 tablet (0.3 mg total) by mouth daily. 07/04/24   Rollene Almarie LABOR, MD  fluticasone  (FLONASE ) 50 MCG/ACT nasal spray Place 2 sprays into both nostrils daily. 09/20/23   Jha, Panav, MD  folic acid  (FOLVITE ) 1 MG tablet Take 1 tablet (1 mg total) by mouth daily. 01/14/18   Rollene Almarie LABOR, MD  furosemide  (LASIX ) 20 MG tablet Take 1 tablet (20 mg total) by mouth daily. As needed for swelling 07/21/24   Rollene Almarie LABOR, MD  gabapentin  (NEURONTIN ) 100 MG capsule Take 1 capsule (100 mg total) by mouth 2 (two) times daily as needed. Patient not taking: Reported on 07/04/2024 08/20/23     gabapentin  (NEURONTIN ) 300 MG capsule Take 2 capsules (600 mg total) by mouth at bedtime. 12/20/23      gabapentin  (NEURONTIN ) 300 MG capsule Take 3 capsules (900 mg total) by mouth at bedtime. 03/08/24   Nida Rosina SAUNDERS, NP  levothyroxine  (SYNTHROID ) 25 MCG tablet Take 1 tablet (25 mcg total) by mouth daily before breakfast. 12/20/23 12/19/24  Rollene Almarie LABOR, MD  linaclotide  (LINZESS ) 145 MCG CAPS capsule Take by mouth. 04/09/22   [provider]  Magnesium  250 MG TABS Take 1 tablet (250 mg total) by mouth daily. 12/20/23   Rollene Almarie LABOR, MD  methocarbamol  (ROBAXIN ) 500 MG tablet Take 1 tablet (500 mg total) by mouth 3 (three) times a day as needed for muscle spasms. 03/22/24     methocarbamol  (ROBAXIN ) 500 MG tablet Take 1 tablet (500 mg total) by mouth 3 (three) times a day as needed for muscle spasms. 04/27/24     methylphenidate  (RITALIN ) 10 MG tablet Take 1 tablet (10 mg total) by mouth daily as needed for fatigue 08/10/24     MINOXIDIL , TOPICAL, 5 % SOLN Use daily as directed. 06/05/24   Rollene Almarie LABOR, MD  Ofatumumab  (KESIMPTA ) 20 MG/0.4ML SOAJ Inject 0.4 mLs (20 mg total) into the skin every 28 days. 01/27/24   Jegede, Olugbemiga E, MD  pimecrolimus  (ELIDEL ) 1 % cream Apply 1 Application topically 2 (two) times daily. Patient taking differently: Apply 1 Application topically daily as needed. 03/02/24     semaglutide -weight management (WEGOVY ) 0.5 MG/0.5ML SOAJ SQ injection Inject 0.5 mg into the skin once a week for 28 days. 08/16/24 09/13/24  Rollene Almarie LABOR, MD  semaglutide -weight management (WEGOVY ) 1 MG/0.5ML SOAJ SQ injection Inject 1 mg into the skin once a week for 28 days. 09/14/24 10/12/24  Rollene Almarie LABOR, MD  semaglutide -weight management (WEGOVY ) 1.7 MG/0.75ML SOAJ SQ injection Inject 1.7 mg into the skin once a week for 28 days. 10/13/24 11/10/24  Rollene Almarie LABOR, MD  semaglutide -weight management (WEGOVY ) 2.4 MG/0.75ML SOAJ SQ injection Inject 2.4 mg into the skin once a week for 28 days. 11/11/24 12/09/24  Rollene Almarie LABOR, MD  solifenacin   (VESICARE ) 10 MG tablet Take 1 tablet (10 mg total) by mouth daily. 12/20/23   Rollene Almarie LABOR, MD  sulfaSALAzine  (AZULFIDINE ) 500 MG tablet Take 2 tablets (1,000 mg total) by mouth 2 (two) times daily. 06/13/24     traZODone  (DESYREL ) 50 MG tablet Take 2 tablets (100 mg total) by mouth at bedtime. 12/20/23   Rollene Almarie LABOR, MD  valACYclovir  (VALTREX ) 1000 MG tablet Take 1 tablet (1,000 mg total) by mouth 2 (two) times daily. Patient not taking: Reported on 07/04/2024 12/20/23   Rollene Almarie LABOR, MD  amantadine  (  SYMMETREL ) 100 MG capsule Take 1 capsule (100 mg total) by mouth 2 (two) times daily. 08/26/23 05/04/24      Family History Family History  Problem Relation Age of Onset   Hypertension Mother    Arthritis Mother    Irritable bowel syndrome Mother    Arthritis Maternal Grandmother    Heart disease Maternal Grandmother    Hyperlipidemia Maternal Grandmother    CAD Maternal Grandfather    Diabetes Maternal Grandfather    Hearing loss Maternal Grandfather    Heart disease Maternal Grandfather    Hyperlipidemia Maternal Grandfather    Diabetes Son    Migraines Neg Hx    Headache Neg Hx    Breast cancer Neg Hx    Colon cancer Neg Hx    Esophageal cancer Neg Hx     Social History Social History[1]   Allergies   Patient has no known allergies.   Review of Systems Review of Systems  Constitutional: Negative.   HENT: Negative.    Respiratory: Negative.    Cardiovascular: Negative.   Gastrointestinal: Negative.   Musculoskeletal:  Positive for joint swelling.     Physical Exam Triage Vital Signs ED Triage Vitals  Encounter Vitals Group     BP 08/24/24 0945 114/75     Girls Systolic BP Percentile --      Girls Diastolic BP Percentile --      Boys Systolic BP Percentile --      Boys Diastolic BP Percentile --      Pulse Rate 08/24/24 0945 79     Resp 08/24/24 0945 16     Temp 08/24/24 0945 98.2 F (36.8 C)     Temp Source 08/24/24 0945 Oral      SpO2 08/24/24 0945 97 %     Weight --      Height --      Head Circumference --      Peak Flow --      Pain Score 08/24/24 0944 3     Pain Loc --      Pain Education --      Exclude from Growth Chart --    No data found.  Updated Vital Signs BP 114/75 (BP Location: Left Arm)   Pulse 79   Temp 98.2 F (36.8 C) (Oral)   Resp 16   SpO2 97%   Visual Acuity Right Eye Distance:   Left Eye Distance:   Bilateral Distance:    Right Eye Near:   Left Eye Near:    Bilateral Near:     Physical Exam Constitutional:      General: She is not in acute distress.    Appearance: Normal appearance. She is normal weight. She is not ill-appearing or toxic-appearing.  Musculoskeletal:        General: Swelling and tenderness present.     Comments: Slight swelling to the left knee;  no lateral/medial joint line tenderness;  TTP around the lateral and medial knee pain.  Pain with full flexion of the knee  Neurological:     Mental Status: She is alert.      UC Treatments / Results  Labs (all labs ordered are listed, but only abnormal results are displayed) Labs Reviewed - No data to display  EKG   Radiology No results found.  Procedures Procedures (including critical care time)  Medications Ordered in UC Medications - No data to display  Initial Impression / Assessment and Plan / UC Course  I have reviewed the  triage vital signs and the nursing notes.  Pertinent labs & imaging results that were available during my care of the patient were reviewed by me and considered in my medical decision making (see chart for details).    Final Clinical Impressions(s) / UC Diagnoses   Final diagnoses:  Chronic pain of left knee     Discharge Instructions      You were seen today for left knee pain.  Since you have an appointment with ortho on Wednesday, I will not order an xray at this time.  You likely have a sprain/strain that will not be visible on xray.  I have given you a  brace today.  Please follow up with the orthopedist as already scheduled.     ED Prescriptions   None    PDMP not reviewed this encounter.     [1]  Social History Tobacco Use   Smoking status: Never   Smokeless tobacco: Never  Vaping Use   Vaping status: Never Used  Substance Use Topics   Alcohol use: Never   Drug use: Never     Darral Longs, MD 08/24/24 [phone]  "

## 2024-08-24 NOTE — Discharge Instructions (Signed)
 You were seen today for left knee pain.  Since you have an appointment with ortho on Wednesday, I will not order an xray at this time.  You likely have a sprain/strain that will not be visible on xray.  I have given you a brace today.  Please follow up with the orthopedist as already scheduled.

## 2024-08-25 ENCOUNTER — Ambulatory Visit

## 2024-08-30 ENCOUNTER — Other Ambulatory Visit: Payer: Self-pay

## 2024-08-30 ENCOUNTER — Other Ambulatory Visit (HOSPITAL_COMMUNITY): Payer: Self-pay

## 2024-08-30 ENCOUNTER — Ambulatory Visit: Admitting: Orthopaedic Surgery

## 2024-08-30 DIAGNOSIS — G8929 Other chronic pain: Secondary | ICD-10-CM

## 2024-08-30 DIAGNOSIS — M25562 Pain in left knee: Secondary | ICD-10-CM | POA: Diagnosis not present

## 2024-08-30 MED ORDER — METHYLPREDNISOLONE ACETATE 40 MG/ML IJ SUSP
40.0000 mg | INTRAMUSCULAR | Status: AC | PRN
  Administered 2024-08-30: 40 mg via INTRA_ARTICULAR

## 2024-08-30 MED ORDER — BUPIVACAINE HCL 0.5 % IJ SOLN
2.0000 mL | INTRAMUSCULAR | Status: AC | PRN
  Administered 2024-08-30: 2 mL via INTRA_ARTICULAR

## 2024-08-30 MED ORDER — LIDOCAINE HCL 1 % IJ SOLN
2.0000 mL | INTRAMUSCULAR | Status: AC | PRN
  Administered 2024-08-30: 2 mL

## 2024-08-30 NOTE — Progress Notes (Signed)
 "  Office Visit Note   Patient: Maria Wiley           Date of Birth: 1976/01/11           MRN: [id number] Visit Date: 08/30/2024              Requested by: Rollene Almarie LABOR, MD [address] [place],  [place] [address] PCP: Rollene Almarie LABOR, MD   Assessment & Plan: Visit Diagnoses:  1. Chronic pain of left knee     Plan: Impression is left knee pain with some improvement.  Could be MCL sprain versus medial meniscus tear.  Treatment options were discussed.  She would like to try a steroid injection today.  Will give it another few weeks and if symptoms do not improve we should move forward with an MRI.  Follow-Up Instructions: Return if symptoms worsen or fail to improve.   Orders:  Orders Placed This Encounter  Procedures   XR KNEE 3 VIEW LEFT   No orders of the defined types were placed in this encounter.     Procedures: Large Joint Inj: L knee on 08/30/2024 3:14 PM Details: 22 G needle Medications: 2 mL bupivacaine  0.5 %; 2 mL lidocaine  1 %; 40 mg methylPREDNISolone  acetate 40 MG/ML Outcome: tolerated well, no immediate complications Patient was prepped and draped in the usual sterile fashion.       Clinical Data: No additional findings.   Subjective: Chief Complaint  Patient presents with   Left Knee - Pain, Edema    HPI Maria Wiley a 49 year old female here for evaluation of left knee pain and some swelling.  She fell about 3 to 4 weeks ago but was having pain prior to that.  Has occasional mechanical symptoms.  She states her left knee was hyperflexed underneath her when she fell. Review of Systems  Constitutional: Negative.   HENT: Negative.    Eyes: Negative.   Respiratory: Negative.    Cardiovascular: Negative.   Endocrine: Negative.   Musculoskeletal: Negative.   Neurological: Negative.   Hematological: Negative.   Psychiatric/Behavioral: Negative.    All other systems reviewed and are negative.    Objective: Vital Signs: There  were no vitals taken for this visit.  Physical Exam Vitals and nursing note reviewed.  Constitutional:      Appearance: She is well-developed.  HENT:     Head: Atraumatic.     Nose: Nose normal.  Eyes:     Extraocular Movements: Extraocular movements intact.  Cardiovascular:     Pulses: Normal pulses.  Pulmonary:     Effort: Pulmonary effort is normal.  Abdominal:     Palpations: Abdomen is soft.  Musculoskeletal:     Cervical back: Neck supple.  Skin:    General: Skin is warm.     Capillary Refill: Capillary refill takes less than 2 seconds.  Neurological:     Mental Status: She is alert. Mental status is at baseline.  Psychiatric:        Behavior: Behavior normal.        Thought Content: Thought content normal.        Judgment: Judgment normal.     Ortho Exam Exam of the left knee shows a small joint effusion.  Slight medial joint line tenderness.  Some pain with valgus stress of the MCL.  Cruciates and collaterals feel stable. Specialty Comments:  No specialty comments available.  Imaging: XR KNEE 3 VIEW LEFT Result Date: 08/30/2024 X-rays of the left knee  show moderate tricompartmental osteoarthritis    PMFS History: Patient Active Problem List   Diagnosis Date Noted   Acute pain of right shoulder 04/05/2024   Hashimoto thyroiditis 10/19/2023   Anemia 10/19/2023   Dysphagia 05/06/2023   Hot flashes 03/24/2023   Insomnia 09/30/2022   Migraines 06/09/2022   Multiple sclerosis 05/11/2022   Vision loss 09/26/2021   B12 deficiency 09/26/2021   Obesity 08/20/2021   Numbness and tingling 02/07/2021   Chronic migraine without aura without status migrainosus, not intractable 05/10/2019   Allergic rhinitis 05/08/2019   Essential hypertension 03/10/2019   Attention and concentration deficit 01/24/2019   Hair thinning 02/25/2018   Ankylosing spondylitis (HCC) 01/15/2018   Routine general medical examination at a health care facility 01/15/2018   IBS (irritable  bowel syndrome) 01/15/2018   RA (rheumatoid arthritis) (HCC)    Hypothyroidism    Past Medical History:  Diagnosis Date   Anemia    I been anemic for many years   Ankylosing spondylitis (HCC)    Anxiety    Since 2016   Chest pain    COVID-19 06/2020   GERD (gastroesophageal reflux disease)    Hypertension    Migraines    Multiple sclerosis    RA (rheumatoid arthritis) (HCC)    Thyroid  disease    Urinary incontinence     Family History  Problem Relation Age of Onset   Hypertension Mother    Arthritis Mother    Irritable bowel syndrome Mother    Arthritis Maternal Grandmother    Heart disease Maternal Grandmother    Hyperlipidemia Maternal Grandmother    CAD Maternal Grandfather    Diabetes Maternal Grandfather    Hearing loss Maternal Grandfather    Heart disease Maternal Grandfather    Hyperlipidemia Maternal Grandfather    Diabetes Son    Migraines Neg Hx    Headache Neg Hx    Breast cancer Neg Hx    Colon cancer Neg Hx    Esophageal cancer Neg Hx     Past Surgical History:  Procedure Laterality Date   ABDOMINAL HYSTERECTOMY     APPENDECTOMY     CHOLECYSTECTOMY     HERNIA REPAIR  2004   KNEE SURGERY Right 2006   TUBAL LIGATION  2012   Social History   Occupational History   Not on file  Tobacco Use   Smoking status: Never   Smokeless tobacco: Never  Vaping Use   Vaping status: Never Used  Substance and Sexual Activity   Alcohol use: Never   Drug use: Never   Sexual activity: Yes    Birth control/protection: Surgical        "

## 2024-09-01 ENCOUNTER — Other Ambulatory Visit (HOSPITAL_COMMUNITY): Payer: Self-pay

## 2024-09-04 ENCOUNTER — Other Ambulatory Visit: Payer: Self-pay | Admitting: Internal Medicine

## 2024-09-04 ENCOUNTER — Other Ambulatory Visit: Payer: Self-pay

## 2024-09-04 ENCOUNTER — Other Ambulatory Visit (HOSPITAL_COMMUNITY): Payer: Self-pay

## 2024-09-05 ENCOUNTER — Other Ambulatory Visit (HOSPITAL_COMMUNITY): Payer: Self-pay

## 2024-09-05 ENCOUNTER — Other Ambulatory Visit: Payer: Self-pay

## 2024-09-05 MED ORDER — ESTROGENS CONJUGATED 0.3 MG PO TABS
0.3000 mg | ORAL_TABLET | Freq: Every day | ORAL | 3 refills | Status: AC
  Filled 2024-09-05: qty 90, 90d supply, fill #0

## 2024-09-06 ENCOUNTER — Other Ambulatory Visit (HOSPITAL_COMMUNITY): Payer: Self-pay

## 2024-09-06 ENCOUNTER — Ambulatory Visit

## 2024-09-06 ENCOUNTER — Other Ambulatory Visit: Payer: Self-pay

## 2024-09-06 MED ORDER — SULFASALAZINE 500 MG PO TABS
1000.0000 mg | ORAL_TABLET | Freq: Two times a day (BID) | ORAL | 1 refills | Status: DC
  Filled 2024-09-07: qty 120, 30d supply, fill #0

## 2024-09-07 ENCOUNTER — Ambulatory Visit
Admission: RE | Admit: 2024-09-07 | Discharge: 2024-09-07 | Disposition: A | Source: Ambulatory Visit | Attending: Internal Medicine | Admitting: Internal Medicine

## 2024-09-07 ENCOUNTER — Other Ambulatory Visit (HOSPITAL_COMMUNITY): Payer: Self-pay

## 2024-09-07 ENCOUNTER — Other Ambulatory Visit: Payer: Self-pay

## 2024-09-07 DIAGNOSIS — Z1231 Encounter for screening mammogram for malignant neoplasm of breast: Secondary | ICD-10-CM

## 2024-09-07 MED ORDER — SULFASALAZINE 500 MG PO TABS
1000.0000 mg | ORAL_TABLET | Freq: Two times a day (BID) | ORAL | 0 refills | Status: AC
  Filled 2024-09-07: qty 120, 30d supply, fill #0

## 2024-09-08 ENCOUNTER — Other Ambulatory Visit: Payer: Self-pay

## 2024-09-08 MED ORDER — SULFASALAZINE 500 MG PO TABS
1000.0000 mg | ORAL_TABLET | Freq: Two times a day (BID) | ORAL | 0 refills | Status: AC
  Filled 2024-09-08: qty 360, 90d supply, fill #0

## 2025-01-15 ENCOUNTER — Ambulatory Visit: Admitting: Dermatology
